# Patient Record
Sex: Female | Born: 1945
Health system: Southern US, Community
[De-identification: ages and names within clinical notes are randomized; demographics above are authoritative.]

## PROBLEM LIST (undated history)

## (undated) DIAGNOSIS — N182 Chronic kidney disease, stage 2 (mild): Secondary | ICD-10-CM

## (undated) DIAGNOSIS — Z171 Estrogen receptor negative status [ER-]: Secondary | ICD-10-CM

## (undated) DIAGNOSIS — F419 Anxiety disorder, unspecified: Secondary | ICD-10-CM

## (undated) DIAGNOSIS — Z860101 Personal history of adenomatous and serrated colon polyps: Secondary | ICD-10-CM

## (undated) DIAGNOSIS — R0600 Dyspnea, unspecified: Secondary | ICD-10-CM

## (undated) DIAGNOSIS — Z86010 Personal history of colonic polyps: Secondary | ICD-10-CM

## (undated) DIAGNOSIS — R0609 Other forms of dyspnea: Secondary | ICD-10-CM

## (undated) DIAGNOSIS — C50212 Malignant neoplasm of upper-inner quadrant of left female breast: Secondary | ICD-10-CM

## (undated) DIAGNOSIS — Z9221 Personal history of antineoplastic chemotherapy: Secondary | ICD-10-CM

## (undated) DIAGNOSIS — T4145XA Adverse effect of unspecified anesthetic, initial encounter: Secondary | ICD-10-CM

## (undated) DIAGNOSIS — Z8542 Personal history of malignant neoplasm of other parts of uterus: Secondary | ICD-10-CM

## (undated) DIAGNOSIS — Z8719 Personal history of other diseases of the digestive system: Secondary | ICD-10-CM

## (undated) DIAGNOSIS — E785 Hyperlipidemia, unspecified: Secondary | ICD-10-CM

## (undated) DIAGNOSIS — K219 Gastro-esophageal reflux disease without esophagitis: Secondary | ICD-10-CM

## (undated) DIAGNOSIS — I10 Essential (primary) hypertension: Secondary | ICD-10-CM

## (undated) DIAGNOSIS — S32000A Wedge compression fracture of unspecified lumbar vertebra, initial encounter for closed fracture: Secondary | ICD-10-CM

## (undated) DIAGNOSIS — N950 Postmenopausal bleeding: Secondary | ICD-10-CM

## (undated) DIAGNOSIS — D649 Anemia, unspecified: Secondary | ICD-10-CM

## (undated) DIAGNOSIS — Z85820 Personal history of malignant melanoma of skin: Secondary | ICD-10-CM

## (undated) DIAGNOSIS — B001 Herpesviral vesicular dermatitis: Secondary | ICD-10-CM

## (undated) DIAGNOSIS — T8859XA Other complications of anesthesia, initial encounter: Secondary | ICD-10-CM

## (undated) DIAGNOSIS — K449 Diaphragmatic hernia without obstruction or gangrene: Secondary | ICD-10-CM

## (undated) DIAGNOSIS — M1990 Unspecified osteoarthritis, unspecified site: Secondary | ICD-10-CM

## (undated) DIAGNOSIS — Z9109 Other allergy status, other than to drugs and biological substances: Secondary | ICD-10-CM

## (undated) HISTORY — DX: Personal history of malignant neoplasm of other parts of uterus: Z85.42

## (undated) HISTORY — PX: TOTAL KNEE ARTHROPLASTY: SHX125

## (undated) HISTORY — PX: BREAST RECONSTRUCTION: SHX9

## (undated) HISTORY — DX: Wedge compression fracture of unspecified lumbar vertebra, initial encounter for closed fracture: S32.000A

## (undated) HISTORY — DX: Anxiety disorder, unspecified: F41.9

## (undated) HISTORY — PX: RHINOPLASTY: SUR1284

## (undated) HISTORY — PX: AUGMENTATION MAMMAPLASTY: SUR837

## (undated) HISTORY — DX: Personal history of malignant melanoma of skin: Z85.820

## (undated) HISTORY — PX: LUMBAR DISC SURGERY: SHX700

## (undated) HISTORY — DX: Hyperlipidemia, unspecified: E78.5

## (undated) HISTORY — PX: KNEE ARTHROSCOPY: SUR90

## (undated) HISTORY — PX: PORTACATH PLACEMENT: SHX2246

## (undated) HISTORY — PX: PORT-A-CATH REMOVAL: SHX5289

## (undated) HISTORY — DX: Essential (primary) hypertension: I10

## (undated) HISTORY — PX: MASTECTOMY: SHX3

## (undated) HISTORY — DX: Gastro-esophageal reflux disease without esophagitis: K21.9

## (undated) HISTORY — PX: MODIFIED RADICAL MASTECTOMY W/ AXILLARY LYMPH NODE DISSECTION: SHX2042

---

## 1978-05-20 HISTORY — PX: HEMORRHOID SURGERY: SHX153

## 1999-04-06 ENCOUNTER — Encounter: Payer: Self-pay | Admitting: Family Medicine

## 1999-04-06 ENCOUNTER — Encounter: Admission: RE | Admit: 1999-04-06 | Discharge: 1999-04-06 | Payer: Self-pay | Admitting: Family Medicine

## 1999-05-18 ENCOUNTER — Encounter: Payer: Self-pay | Admitting: Neurosurgery

## 1999-05-22 ENCOUNTER — Observation Stay (HOSPITAL_COMMUNITY): Admission: RE | Admit: 1999-05-22 | Discharge: 1999-05-23 | Payer: Self-pay | Admitting: Neurosurgery

## 1999-05-22 ENCOUNTER — Encounter: Payer: Self-pay | Admitting: Neurosurgery

## 2000-06-04 ENCOUNTER — Ambulatory Visit (HOSPITAL_COMMUNITY): Admission: RE | Admit: 2000-06-04 | Discharge: 2000-06-04 | Payer: Self-pay | Admitting: Family Medicine

## 2000-06-04 ENCOUNTER — Encounter: Payer: Self-pay | Admitting: Family Medicine

## 2000-07-21 ENCOUNTER — Encounter (INDEPENDENT_AMBULATORY_CARE_PROVIDER_SITE_OTHER): Payer: Self-pay

## 2000-07-21 ENCOUNTER — Other Ambulatory Visit: Admission: RE | Admit: 2000-07-21 | Discharge: 2000-07-21 | Payer: Self-pay | Admitting: Gastroenterology

## 2000-07-24 ENCOUNTER — Ambulatory Visit (HOSPITAL_COMMUNITY): Admission: RE | Admit: 2000-07-24 | Discharge: 2000-07-24 | Payer: Self-pay | Admitting: General Surgery

## 2000-08-13 ENCOUNTER — Encounter: Payer: Self-pay | Admitting: Family Medicine

## 2000-08-13 ENCOUNTER — Ambulatory Visit (HOSPITAL_COMMUNITY): Admission: RE | Admit: 2000-08-13 | Discharge: 2000-08-13 | Payer: Self-pay | Admitting: Family Medicine

## 2000-09-16 ENCOUNTER — Encounter (INDEPENDENT_AMBULATORY_CARE_PROVIDER_SITE_OTHER): Payer: Self-pay

## 2000-09-16 ENCOUNTER — Other Ambulatory Visit: Admission: RE | Admit: 2000-09-16 | Discharge: 2000-09-16 | Payer: Self-pay | Admitting: Gastroenterology

## 2000-11-27 ENCOUNTER — Ambulatory Visit (HOSPITAL_COMMUNITY): Admission: AD | Admit: 2000-11-27 | Discharge: 2000-11-28 | Payer: Self-pay | Admitting: Neurosurgery

## 2000-11-27 ENCOUNTER — Encounter: Payer: Self-pay | Admitting: Neurosurgery

## 2001-02-17 HISTORY — PX: KNEE ARTHROSCOPY: SUR90

## 2001-07-07 ENCOUNTER — Other Ambulatory Visit: Admission: RE | Admit: 2001-07-07 | Discharge: 2001-07-07 | Payer: Self-pay | Admitting: Obstetrics and Gynecology

## 2002-04-23 ENCOUNTER — Encounter: Payer: Self-pay | Admitting: Specialist

## 2002-04-26 ENCOUNTER — Inpatient Hospital Stay (HOSPITAL_COMMUNITY): Admission: RE | Admit: 2002-04-26 | Discharge: 2002-05-01 | Payer: Self-pay | Admitting: Specialist

## 2002-04-26 ENCOUNTER — Encounter: Payer: Self-pay | Admitting: Specialist

## 2002-04-28 ENCOUNTER — Encounter: Payer: Self-pay | Admitting: Specialist

## 2002-07-20 ENCOUNTER — Other Ambulatory Visit: Admission: RE | Admit: 2002-07-20 | Discharge: 2002-07-20 | Payer: Self-pay | Admitting: *Deleted

## 2002-12-01 ENCOUNTER — Encounter: Payer: Self-pay | Admitting: Family Medicine

## 2002-12-01 ENCOUNTER — Encounter: Admission: RE | Admit: 2002-12-01 | Discharge: 2002-12-01 | Payer: Self-pay | Admitting: Family Medicine

## 2002-12-23 ENCOUNTER — Encounter: Admission: RE | Admit: 2002-12-23 | Discharge: 2002-12-23 | Payer: Self-pay | Admitting: Family Medicine

## 2002-12-23 ENCOUNTER — Encounter: Payer: Self-pay | Admitting: Family Medicine

## 2003-05-21 DIAGNOSIS — Z8719 Personal history of other diseases of the digestive system: Secondary | ICD-10-CM

## 2003-05-21 HISTORY — DX: Personal history of other diseases of the digestive system: Z87.19

## 2003-07-14 ENCOUNTER — Encounter: Admission: RE | Admit: 2003-07-14 | Discharge: 2003-07-14 | Payer: Self-pay | Admitting: Family Medicine

## 2003-08-03 ENCOUNTER — Other Ambulatory Visit: Admission: RE | Admit: 2003-08-03 | Discharge: 2003-08-03 | Payer: Self-pay | Admitting: *Deleted

## 2003-08-03 ENCOUNTER — Encounter: Payer: Self-pay | Admitting: Gastroenterology

## 2003-08-03 DIAGNOSIS — K649 Unspecified hemorrhoids: Secondary | ICD-10-CM | POA: Insufficient documentation

## 2003-08-03 DIAGNOSIS — D126 Benign neoplasm of colon, unspecified: Secondary | ICD-10-CM | POA: Insufficient documentation

## 2004-03-18 ENCOUNTER — Emergency Department (HOSPITAL_COMMUNITY): Admission: EM | Admit: 2004-03-18 | Discharge: 2004-03-18 | Payer: Self-pay | Admitting: Emergency Medicine

## 2004-03-27 ENCOUNTER — Ambulatory Visit: Payer: Self-pay | Admitting: Family Medicine

## 2004-04-13 ENCOUNTER — Ambulatory Visit: Payer: Self-pay | Admitting: Family Medicine

## 2004-08-27 ENCOUNTER — Ambulatory Visit: Payer: Self-pay | Admitting: Family Medicine

## 2004-09-05 ENCOUNTER — Other Ambulatory Visit: Admission: RE | Admit: 2004-09-05 | Discharge: 2004-09-05 | Payer: Self-pay | Admitting: Obstetrics and Gynecology

## 2004-09-13 ENCOUNTER — Ambulatory Visit (HOSPITAL_COMMUNITY): Admission: RE | Admit: 2004-09-13 | Discharge: 2004-09-13 | Payer: Self-pay | Admitting: Obstetrics and Gynecology

## 2004-09-26 ENCOUNTER — Ambulatory Visit: Payer: Self-pay | Admitting: Family Medicine

## 2004-12-20 ENCOUNTER — Ambulatory Visit: Payer: Self-pay | Admitting: Family Medicine

## 2004-12-27 ENCOUNTER — Ambulatory Visit: Payer: Self-pay | Admitting: Family Medicine

## 2005-02-13 ENCOUNTER — Ambulatory Visit: Payer: Self-pay | Admitting: Family Medicine

## 2005-06-28 ENCOUNTER — Ambulatory Visit: Payer: Self-pay | Admitting: Family Medicine

## 2005-09-25 ENCOUNTER — Ambulatory Visit: Payer: Self-pay | Admitting: Family Medicine

## 2006-01-03 ENCOUNTER — Ambulatory Visit: Payer: Self-pay | Admitting: Family Medicine

## 2006-08-06 ENCOUNTER — Ambulatory Visit: Payer: Self-pay | Admitting: Family Medicine

## 2006-10-24 ENCOUNTER — Ambulatory Visit: Payer: Self-pay | Admitting: Family Medicine

## 2006-10-24 LAB — CONVERTED CEMR LAB
ALT: 27 units/L (ref 0–40)
AST: 29 units/L (ref 0–37)
Albumin: 3.8 g/dL (ref 3.5–5.2)
Alkaline Phosphatase: 103 units/L (ref 39–117)
BUN: 20 mg/dL (ref 6–23)
Basophils Absolute: 0 10*3/uL (ref 0.0–0.1)
Basophils Relative: 0.8 % (ref 0.0–1.0)
CO2: 31 meq/L (ref 19–32)
Chloride: 105 meq/L (ref 96–112)
Cholesterol: 185 mg/dL (ref 0–200)
Eosinophils Absolute: 0.2 10*3/uL (ref 0.0–0.6)
Eosinophils Relative: 4.9 % (ref 0.0–5.0)
GFR calc Af Amer: 109 mL/min
GFR calc non Af Amer: 90 mL/min
Glucose, Bld: 110 mg/dL — ABNORMAL HIGH (ref 70–99)
HCT: 33.8 % — ABNORMAL LOW (ref 36.0–46.0)
HDL: 53.4 mg/dL (ref >39.0)
Hemoglobin: 11.5 g/dL — ABNORMAL LOW (ref 12.0–15.0)
Hgb A1c MFr Bld: 6.1 % — ABNORMAL HIGH (ref 4.6–6.0)
LDL Cholesterol: 104 mg/dL — ABNORMAL HIGH (ref 0–99)
Lymphocytes Relative: 25.9 % (ref 12.0–46.0)
MCHC: 34 g/dL (ref 30.0–36.0)
MCV: 84 fL (ref 78.0–100.0)
Monocytes Absolute: 0.4 10*3/uL (ref 0.2–0.7)
Monocytes Relative: 7.9 % (ref 3.0–11.0)
Neutro Abs: 3.2 10*3/uL (ref 1.4–7.7)
Neutrophils Relative %: 60.5 % (ref 43.0–77.0)
Platelets: 241 10*3/uL (ref 150–400)
Potassium: 3.6 meq/L (ref 3.5–5.1)
TSH: 3.12 microintl units/mL (ref 0.35–5.50)
Total Bilirubin: 0.6 mg/dL (ref 0.3–1.2)
Total CHOL/HDL Ratio: 3.5
Total Protein: 6.7 g/dL (ref 6.0–8.3)
Triglycerides: 137 mg/dL (ref 0–149)

## 2006-11-24 ENCOUNTER — Ambulatory Visit: Payer: Self-pay | Admitting: Gastroenterology

## 2006-11-25 ENCOUNTER — Encounter: Payer: Self-pay | Admitting: Gastroenterology

## 2006-11-25 ENCOUNTER — Ambulatory Visit: Payer: Self-pay | Admitting: Gastroenterology

## 2006-11-25 DIAGNOSIS — K449 Diaphragmatic hernia without obstruction or gangrene: Secondary | ICD-10-CM | POA: Insufficient documentation

## 2006-12-08 DIAGNOSIS — K219 Gastro-esophageal reflux disease without esophagitis: Secondary | ICD-10-CM | POA: Insufficient documentation

## 2006-12-08 DIAGNOSIS — I10 Essential (primary) hypertension: Secondary | ICD-10-CM | POA: Insufficient documentation

## 2006-12-08 DIAGNOSIS — F320 Major depressive disorder, single episode, mild: Secondary | ICD-10-CM | POA: Insufficient documentation

## 2006-12-08 DIAGNOSIS — J45909 Unspecified asthma, uncomplicated: Secondary | ICD-10-CM | POA: Insufficient documentation

## 2007-01-08 ENCOUNTER — Ambulatory Visit: Payer: Self-pay | Admitting: Family Medicine

## 2007-01-08 DIAGNOSIS — L723 Sebaceous cyst: Secondary | ICD-10-CM | POA: Insufficient documentation

## 2007-01-20 ENCOUNTER — Telehealth: Payer: Self-pay | Admitting: Family Medicine

## 2007-03-26 ENCOUNTER — Encounter (INDEPENDENT_AMBULATORY_CARE_PROVIDER_SITE_OTHER): Payer: Self-pay | Admitting: Obstetrics and Gynecology

## 2007-03-26 ENCOUNTER — Ambulatory Visit (HOSPITAL_COMMUNITY): Admission: RE | Admit: 2007-03-26 | Discharge: 2007-03-26 | Payer: Self-pay | Admitting: Obstetrics and Gynecology

## 2007-05-25 DIAGNOSIS — K5289 Other specified noninfective gastroenteritis and colitis: Secondary | ICD-10-CM | POA: Insufficient documentation

## 2007-05-28 ENCOUNTER — Ambulatory Visit: Payer: Self-pay | Admitting: Family Medicine

## 2007-07-15 ENCOUNTER — Telehealth: Payer: Self-pay | Admitting: Family Medicine

## 2007-09-09 DIAGNOSIS — K319 Disease of stomach and duodenum, unspecified: Secondary | ICD-10-CM | POA: Insufficient documentation

## 2007-09-09 DIAGNOSIS — E785 Hyperlipidemia, unspecified: Secondary | ICD-10-CM | POA: Insufficient documentation

## 2007-10-14 ENCOUNTER — Ambulatory Visit: Payer: Self-pay | Admitting: Family Medicine

## 2007-10-16 LAB — CONVERTED CEMR LAB
ALT: 21 units/L (ref 0–35)
Alkaline Phosphatase: 93 units/L (ref 39–117)
BUN: 20 mg/dL (ref 6–23)
Basophils Absolute: 0.1 10*3/uL (ref 0.0–0.1)
Basophils Relative: 1.3 % — ABNORMAL HIGH (ref 0.0–1.0)
Bilirubin, Direct: 0.1 mg/dL (ref 0.0–0.3)
CO2: 30 meq/L (ref 19–32)
Calcium: 9.1 mg/dL (ref 8.4–10.5)
Chloride: 107 meq/L (ref 96–112)
Cholesterol: 176 mg/dL (ref 0–200)
Creatinine, Ser: 0.9 mg/dL (ref 0.4–1.2)
Eosinophils Absolute: 0.2 10*3/uL (ref 0.0–0.7)
Eosinophils Relative: 4.5 % (ref 0.0–5.0)
GFR calc Af Amer: 82 mL/min
GFR calc non Af Amer: 67 mL/min
HCT: 31.5 % — ABNORMAL LOW (ref 36.0–46.0)
HDL: 48.8 mg/dL (ref >39.0)
Hemoglobin: 10.3 g/dL — ABNORMAL LOW (ref 12.0–15.0)
LDL Cholesterol: 107 mg/dL — ABNORMAL HIGH (ref 0–99)
Lymphocytes Relative: 28.6 % (ref 12.0–46.0)
MCHC: 32.7 g/dL (ref 30.0–36.0)
MCV: 80.5 fL (ref 78.0–100.0)
Monocytes Absolute: 0.3 10*3/uL (ref 0.1–1.0)
Neutro Abs: 2.5 10*3/uL (ref 1.4–7.7)
Neutrophils Relative %: 58.6 % (ref 43.0–77.0)
Potassium: 4.2 meq/L (ref 3.5–5.1)
RBC: 3.92 M/uL (ref 3.87–5.11)
TSH: 3.07 microintl units/mL (ref 0.35–5.50)
Total Bilirubin: 0.6 mg/dL (ref 0.3–1.2)
Total CHOL/HDL Ratio: 3.6
Total Protein: 6.2 g/dL (ref 6.0–8.3)
Triglycerides: 101 mg/dL (ref 0–149)
VLDL: 20 mg/dL (ref 0–40)
WBC: 4.3 10*3/uL — ABNORMAL LOW (ref 4.5–10.5)

## 2007-10-21 ENCOUNTER — Ambulatory Visit: Payer: Self-pay | Admitting: Internal Medicine

## 2007-10-21 ENCOUNTER — Ambulatory Visit: Payer: Self-pay | Admitting: Family Medicine

## 2007-10-21 DIAGNOSIS — M810 Age-related osteoporosis without current pathological fracture: Secondary | ICD-10-CM | POA: Insufficient documentation

## 2007-10-21 DIAGNOSIS — M1990 Unspecified osteoarthritis, unspecified site: Secondary | ICD-10-CM | POA: Insufficient documentation

## 2007-10-22 ENCOUNTER — Telehealth (INDEPENDENT_AMBULATORY_CARE_PROVIDER_SITE_OTHER): Payer: Self-pay | Admitting: *Deleted

## 2007-11-09 ENCOUNTER — Ambulatory Visit: Payer: Self-pay | Admitting: Gastroenterology

## 2007-11-09 ENCOUNTER — Telehealth: Payer: Self-pay | Admitting: Family Medicine

## 2007-11-09 DIAGNOSIS — Z860100 Personal history of colon polyps, unspecified: Secondary | ICD-10-CM | POA: Insufficient documentation

## 2007-11-09 DIAGNOSIS — K625 Hemorrhage of anus and rectum: Secondary | ICD-10-CM | POA: Insufficient documentation

## 2007-11-10 ENCOUNTER — Ambulatory Visit: Payer: Self-pay | Admitting: Gastroenterology

## 2007-11-10 LAB — HM COLONOSCOPY

## 2007-12-04 ENCOUNTER — Telehealth: Payer: Self-pay | Admitting: Family Medicine

## 2007-12-07 ENCOUNTER — Telehealth (INDEPENDENT_AMBULATORY_CARE_PROVIDER_SITE_OTHER): Payer: Self-pay | Admitting: *Deleted

## 2007-12-28 ENCOUNTER — Emergency Department (HOSPITAL_BASED_OUTPATIENT_CLINIC_OR_DEPARTMENT_OTHER): Admission: EM | Admit: 2007-12-28 | Discharge: 2007-12-28 | Payer: Self-pay | Admitting: Emergency Medicine

## 2008-03-28 ENCOUNTER — Ambulatory Visit: Payer: Self-pay | Admitting: Family Medicine

## 2008-03-28 LAB — CONVERTED CEMR LAB
Bilirubin Urine: NEGATIVE
Blood in Urine, dipstick: NEGATIVE
Glucose, Urine, Semiquant: NEGATIVE
Ketones, urine, test strip: NEGATIVE
Nitrite: NEGATIVE
Protein, U semiquant: NEGATIVE
Specific Gravity, Urine: <1.005
pH: 7

## 2008-04-25 ENCOUNTER — Telehealth: Payer: Self-pay | Admitting: Family Medicine

## 2008-05-27 ENCOUNTER — Telehealth: Payer: Self-pay | Admitting: Family Medicine

## 2008-07-13 ENCOUNTER — Ambulatory Visit: Payer: Self-pay | Admitting: Family Medicine

## 2008-07-13 LAB — CONVERTED CEMR LAB
Bilirubin Urine: NEGATIVE
Blood in Urine, dipstick: NEGATIVE
Nitrite: NEGATIVE
Protein, U semiquant: NEGATIVE
Specific Gravity, Urine: 1.025
Urobilinogen, UA: 0.2
pH: 5.5

## 2008-07-14 ENCOUNTER — Encounter: Payer: Self-pay | Admitting: Family Medicine

## 2008-10-28 ENCOUNTER — Ambulatory Visit: Payer: Self-pay | Admitting: Family Medicine

## 2008-11-01 ENCOUNTER — Telehealth: Payer: Self-pay | Admitting: Family Medicine

## 2008-11-07 ENCOUNTER — Telehealth: Payer: Self-pay | Admitting: Family Medicine

## 2008-12-13 ENCOUNTER — Emergency Department (HOSPITAL_BASED_OUTPATIENT_CLINIC_OR_DEPARTMENT_OTHER): Admission: EM | Admit: 2008-12-13 | Discharge: 2008-12-13 | Payer: Self-pay | Admitting: Emergency Medicine

## 2008-12-13 ENCOUNTER — Ambulatory Visit: Payer: Self-pay | Admitting: Radiology

## 2009-03-21 ENCOUNTER — Telehealth: Payer: Self-pay | Admitting: Family Medicine

## 2009-05-01 ENCOUNTER — Ambulatory Visit: Payer: Self-pay | Admitting: Family Medicine

## 2009-05-01 DIAGNOSIS — M545 Low back pain, unspecified: Secondary | ICD-10-CM | POA: Insufficient documentation

## 2009-05-01 LAB — CONVERTED CEMR LAB
Bilirubin Urine: NEGATIVE
Ketones, urine, test strip: NEGATIVE
Specific Gravity, Urine: 1.015
Urobilinogen, UA: 0.2

## 2009-05-03 ENCOUNTER — Ambulatory Visit: Payer: Self-pay | Admitting: Family Medicine

## 2009-05-03 DIAGNOSIS — S2249XA Multiple fractures of ribs, unspecified side, initial encounter for closed fracture: Secondary | ICD-10-CM | POA: Insufficient documentation

## 2009-05-03 DIAGNOSIS — IMO0002 Reserved for concepts with insufficient information to code with codable children: Secondary | ICD-10-CM | POA: Insufficient documentation

## 2009-05-05 LAB — CONVERTED CEMR LAB
ALT: 20 units/L (ref 0–35)
AST: 24 units/L (ref 0–37)
Albumin: 4.1 g/dL (ref 3.5–5.2)
Eosinophils Relative: 4.1 % (ref 0.0–5.0)
GFR calc non Af Amer: 76.84 mL/min (ref >60)
HCT: 37.3 % (ref 36.0–46.0)
Hemoglobin: 12.6 g/dL (ref 12.0–15.0)
Lymphs Abs: 1.1 10*3/uL (ref 0.7–4.0)
Monocytes Relative: 5.5 % (ref 3.0–12.0)
Neutro Abs: 4 10*3/uL (ref 1.4–7.7)
Potassium: 3.6 meq/L (ref 3.5–5.1)
Sodium: 140 meq/L (ref 135–145)
TSH: 2.34 microintl units/mL (ref 0.35–5.50)
WBC: 5.7 10*3/uL (ref 4.5–10.5)

## 2009-05-08 ENCOUNTER — Encounter: Payer: Self-pay | Admitting: Family Medicine

## 2009-05-09 ENCOUNTER — Ambulatory Visit: Payer: Self-pay | Admitting: Internal Medicine

## 2009-05-15 ENCOUNTER — Encounter (HOSPITAL_COMMUNITY): Admission: RE | Admit: 2009-05-15 | Discharge: 2009-07-24 | Payer: Self-pay | Admitting: Family Medicine

## 2009-05-15 ENCOUNTER — Encounter: Admission: RE | Admit: 2009-05-15 | Discharge: 2009-05-15 | Payer: Self-pay | Admitting: Family Medicine

## 2009-05-16 ENCOUNTER — Encounter: Payer: Self-pay | Admitting: Family Medicine

## 2009-05-20 DIAGNOSIS — Z9221 Personal history of antineoplastic chemotherapy: Secondary | ICD-10-CM

## 2009-05-20 HISTORY — DX: Personal history of antineoplastic chemotherapy: Z92.21

## 2009-06-23 ENCOUNTER — Ambulatory Visit: Payer: Self-pay | Admitting: Family Medicine

## 2009-06-23 DIAGNOSIS — J019 Acute sinusitis, unspecified: Secondary | ICD-10-CM | POA: Insufficient documentation

## 2009-09-11 ENCOUNTER — Telehealth: Payer: Self-pay | Admitting: Family Medicine

## 2010-01-30 ENCOUNTER — Encounter: Admission: RE | Admit: 2010-01-30 | Discharge: 2010-01-30 | Payer: Self-pay | Admitting: Obstetrics and Gynecology

## 2010-02-05 ENCOUNTER — Encounter: Admission: RE | Admit: 2010-02-05 | Discharge: 2010-02-05 | Payer: Self-pay | Admitting: Obstetrics and Gynecology

## 2010-02-05 ENCOUNTER — Encounter: Payer: Self-pay | Admitting: Family Medicine

## 2010-02-08 ENCOUNTER — Encounter: Admission: RE | Admit: 2010-02-08 | Discharge: 2010-02-08 | Payer: Self-pay | Admitting: Obstetrics and Gynecology

## 2010-02-08 LAB — HM MAMMOGRAPHY

## 2010-02-14 ENCOUNTER — Encounter: Payer: Self-pay | Admitting: Family Medicine

## 2010-02-21 ENCOUNTER — Telehealth: Payer: Self-pay | Admitting: Family Medicine

## 2010-02-26 ENCOUNTER — Ambulatory Visit: Payer: Self-pay | Admitting: Family Medicine

## 2010-03-21 ENCOUNTER — Encounter (INDEPENDENT_AMBULATORY_CARE_PROVIDER_SITE_OTHER): Payer: Self-pay | Admitting: General Surgery

## 2010-03-21 ENCOUNTER — Inpatient Hospital Stay (HOSPITAL_COMMUNITY)
Admission: RE | Admit: 2010-03-21 | Discharge: 2010-03-23 | Payer: Self-pay | Source: Home / Self Care | Admitting: General Surgery

## 2010-03-27 ENCOUNTER — Encounter: Payer: Self-pay | Admitting: Family Medicine

## 2010-04-04 ENCOUNTER — Encounter: Payer: Self-pay | Admitting: Family Medicine

## 2010-04-05 ENCOUNTER — Ambulatory Visit: Payer: Self-pay | Admitting: Oncology

## 2010-04-11 ENCOUNTER — Encounter: Payer: Self-pay | Admitting: Family Medicine

## 2010-04-19 ENCOUNTER — Ambulatory Visit (HOSPITAL_BASED_OUTPATIENT_CLINIC_OR_DEPARTMENT_OTHER)
Admission: RE | Admit: 2010-04-19 | Discharge: 2010-04-19 | Payer: Self-pay | Source: Home / Self Care | Admitting: General Surgery

## 2010-04-24 ENCOUNTER — Ambulatory Visit (HOSPITAL_COMMUNITY)
Admission: RE | Admit: 2010-04-24 | Discharge: 2010-04-24 | Payer: Self-pay | Source: Home / Self Care | Admitting: Oncology

## 2010-04-24 ENCOUNTER — Ambulatory Visit: Payer: Self-pay

## 2010-04-24 ENCOUNTER — Encounter: Payer: Self-pay | Admitting: Cardiology

## 2010-04-24 ENCOUNTER — Encounter: Payer: Self-pay | Admitting: Oncology

## 2010-04-25 ENCOUNTER — Encounter: Payer: Self-pay | Admitting: Family Medicine

## 2010-04-25 LAB — CBC WITH DIFFERENTIAL/PLATELET
BASO%: 0.5 % (ref 0.0–2.0)
EOS%: 11.2 % — ABNORMAL HIGH (ref 0.0–7.0)
LYMPH%: 21.9 % (ref 14.0–49.7)
MCH: 27.8 pg (ref 25.1–34.0)
MCHC: 32.7 g/dL (ref 31.5–36.0)
MCV: 85 fL (ref 79.5–101.0)
MONO#: 0.5 10*3/uL (ref 0.1–0.9)
MONO%: 9.5 % (ref 0.0–14.0)
Platelets: 162 10*3/uL (ref 145–400)
RBC: 4.21 10*6/uL (ref 3.70–5.45)
WBC: 5.5 10*3/uL (ref 3.9–10.3)

## 2010-04-25 LAB — COMPREHENSIVE METABOLIC PANEL
ALT: 12 U/L (ref 0–35)
CO2: 29 mEq/L (ref 19–32)
Sodium: 139 mEq/L (ref 135–145)
Total Bilirubin: 0.5 mg/dL (ref 0.3–1.2)
Total Protein: 6.6 g/dL (ref 6.0–8.3)

## 2010-05-02 ENCOUNTER — Encounter: Payer: Self-pay | Admitting: Family Medicine

## 2010-05-02 LAB — CBC WITH DIFFERENTIAL/PLATELET
BASO%: 2.2 % — ABNORMAL HIGH (ref 0.0–2.0)
EOS%: 24.7 % — ABNORMAL HIGH (ref 0.0–7.0)
MCHC: 32.3 g/dL (ref 31.5–36.0)
MONO#: 0 10*3/uL — ABNORMAL LOW (ref 0.1–0.9)
RBC: 3.76 10*6/uL (ref 3.70–5.45)
RDW: 13.4 % (ref 11.2–14.5)
WBC: 0.9 10*3/uL — CL (ref 3.9–10.3)
lymph#: 0.5 10*3/uL — ABNORMAL LOW (ref 0.9–3.3)
nRBC: 0 % (ref 0–0)

## 2010-05-02 LAB — URINALYSIS, MICROSCOPIC - CHCC
Blood: NEGATIVE
Glucose: NEGATIVE g/dL
Leukocyte Esterase: NEGATIVE
Nitrite: NEGATIVE

## 2010-05-02 LAB — BASIC METABOLIC PANEL
Chloride: 103 mEq/L (ref 96–112)
Potassium: 4.3 mEq/L (ref 3.5–5.3)

## 2010-05-03 LAB — URINE CULTURE

## 2010-05-07 ENCOUNTER — Telehealth: Payer: Self-pay | Admitting: Family Medicine

## 2010-05-08 ENCOUNTER — Ambulatory Visit: Payer: Self-pay | Admitting: Oncology

## 2010-05-09 ENCOUNTER — Encounter: Payer: Self-pay | Admitting: Family Medicine

## 2010-05-09 LAB — COMPREHENSIVE METABOLIC PANEL
AST: 17 U/L (ref 0–37)
Albumin: 4.4 g/dL (ref 3.5–5.2)
BUN: 21 mg/dL (ref 6–23)
Calcium: 8.7 mg/dL (ref 8.4–10.5)
Chloride: 103 mEq/L (ref 96–112)
Glucose, Bld: 98 mg/dL (ref 70–99)
Potassium: 4 mEq/L (ref 3.5–5.3)
Sodium: 140 mEq/L (ref 135–145)
Total Protein: 6.3 g/dL (ref 6.0–8.3)

## 2010-05-09 LAB — CBC WITH DIFFERENTIAL/PLATELET
Eosinophils Absolute: 0.1 10*3/uL (ref 0.0–0.5)
HCT: 35.4 % (ref 34.8–46.6)
LYMPH%: 15.9 % (ref 14.0–49.7)
MCHC: 32.5 g/dL (ref 31.5–36.0)
MONO#: 0.8 10*3/uL (ref 0.1–0.9)
NEUT%: 72.9 % (ref 38.4–76.8)
Platelets: 138 10*3/uL — ABNORMAL LOW (ref 145–400)

## 2010-05-16 ENCOUNTER — Encounter: Payer: Self-pay | Admitting: Family Medicine

## 2010-05-16 LAB — CBC WITH DIFFERENTIAL/PLATELET
Basophils Absolute: 0 10*3/uL (ref 0.0–0.1)
EOS%: 1 % (ref 0.0–7.0)
HCT: 29.5 % — ABNORMAL LOW (ref 34.8–46.6)
HGB: 10.1 g/dL — ABNORMAL LOW (ref 11.6–15.9)
MCH: 28.8 pg (ref 25.1–34.0)
MCV: 84.3 fL (ref 79.5–101.0)
MONO%: 0.6 % (ref 0.0–14.0)
NEUT%: 85.1 % — ABNORMAL HIGH (ref 38.4–76.8)

## 2010-05-16 LAB — BASIC METABOLIC PANEL
BUN: 23 mg/dL (ref 6–23)
Creatinine, Ser: 0.8 mg/dL (ref 0.40–1.20)

## 2010-05-23 ENCOUNTER — Encounter: Payer: Self-pay | Admitting: Family Medicine

## 2010-05-23 LAB — COMPREHENSIVE METABOLIC PANEL
ALT: 19 U/L (ref 0–35)
AST: 22 U/L (ref 0–37)
Albumin: 3.8 g/dL (ref 3.5–5.2)
Alkaline Phosphatase: 95 U/L (ref 39–117)
BUN: 15 mg/dL (ref 6–23)
CO2: 31 mEq/L (ref 19–32)
Calcium: 9.1 mg/dL (ref 8.4–10.5)
Chloride: 100 mEq/L (ref 96–112)
Creatinine, Ser: 0.92 mg/dL (ref 0.40–1.20)
Glucose, Bld: 85 mg/dL (ref 70–99)
Potassium: 3.9 mEq/L (ref 3.5–5.3)
Sodium: 140 mEq/L (ref 135–145)
Total Bilirubin: 0.5 mg/dL (ref 0.3–1.2)
Total Protein: 6 g/dL (ref 6.0–8.3)

## 2010-05-23 LAB — CBC WITH DIFFERENTIAL/PLATELET
BASO%: 1.5 % (ref 0.0–2.0)
Basophils Absolute: 0.2 10*3/uL — ABNORMAL HIGH (ref 0.0–0.1)
EOS%: 1.3 % (ref 0.0–7.0)
Eosinophils Absolute: 0.1 10*3/uL (ref 0.0–0.5)
HCT: 31.6 % — ABNORMAL LOW (ref 34.8–46.6)
HGB: 10.3 g/dL — ABNORMAL LOW (ref 11.6–15.9)
LYMPH%: 9.3 % — ABNORMAL LOW (ref 14.0–49.7)
MCH: 27.8 pg (ref 25.1–34.0)
MCHC: 32.6 g/dL (ref 31.5–36.0)
MCV: 85.2 fL (ref 79.5–101.0)
MONO#: 1.1 10*3/uL — ABNORMAL HIGH (ref 0.1–0.9)
MONO%: 9.9 % (ref 0.0–14.0)
NEUT#: 8.5 10*3/uL — ABNORMAL HIGH (ref 1.5–6.5)
NEUT%: 78 % — ABNORMAL HIGH (ref 38.4–76.8)
Platelets: 151 10*3/uL (ref 145–400)
RBC: 3.71 10*6/uL (ref 3.70–5.45)
RDW: 15.2 % — ABNORMAL HIGH (ref 11.2–14.5)
WBC: 10.8 10*3/uL — ABNORMAL HIGH (ref 3.9–10.3)
lymph#: 1 10*3/uL (ref 0.9–3.3)
nRBC: 0 % (ref 0–0)

## 2010-05-31 LAB — BASIC METABOLIC PANEL
BUN: 25 mg/dL — ABNORMAL HIGH (ref 6–23)
CO2: 27 mEq/L (ref 19–32)
Calcium: 9 mg/dL (ref 8.4–10.5)
Chloride: 103 mEq/L (ref 96–112)
Creatinine, Ser: 0.8 mg/dL (ref 0.40–1.20)
Glucose, Bld: 113 mg/dL — ABNORMAL HIGH (ref 70–99)
Potassium: 3.6 mEq/L (ref 3.5–5.3)
Sodium: 141 mEq/L (ref 135–145)

## 2010-05-31 LAB — CBC WITH DIFFERENTIAL/PLATELET
BASO%: 1.7 % (ref 0.0–2.0)
Basophils Absolute: 0 10*3/uL (ref 0.0–0.1)
EOS%: 0.9 % (ref 0.0–7.0)
Eosinophils Absolute: 0 10*3/uL (ref 0.0–0.5)
HCT: 27.7 % — ABNORMAL LOW (ref 34.8–46.6)
HGB: 9.5 g/dL — ABNORMAL LOW (ref 11.6–15.9)
LYMPH%: 9 % — ABNORMAL LOW (ref 14.0–49.7)
MCH: 28.7 pg (ref 25.1–34.0)
MCHC: 34.1 g/dL (ref 31.5–36.0)
MCV: 84.2 fL (ref 79.5–101.0)
MONO#: 0.1 10*3/uL (ref 0.1–0.9)
MONO%: 3.1 % (ref 0.0–14.0)
NEUT#: 2.4 10*3/uL (ref 1.5–6.5)
NEUT%: 85.3 % — ABNORMAL HIGH (ref 38.4–76.8)
Platelets: 118 10*3/uL — ABNORMAL LOW (ref 145–400)
RBC: 3.29 10*6/uL — ABNORMAL LOW (ref 3.70–5.45)
RDW: 14.7 % — ABNORMAL HIGH (ref 11.2–14.5)
WBC: 2.8 10*3/uL — ABNORMAL LOW (ref 3.9–10.3)
lymph#: 0.3 10*3/uL — ABNORMAL LOW (ref 0.9–3.3)

## 2010-06-06 LAB — CBC WITH DIFFERENTIAL/PLATELET
BASO%: 1.6 % (ref 0.0–2.0)
Basophils Absolute: 0.1 10*3/uL (ref 0.0–0.1)
EOS%: 1.4 % (ref 0.0–7.0)
Eosinophils Absolute: 0.1 10*3/uL (ref 0.0–0.5)
HCT: 29.2 % — ABNORMAL LOW (ref 34.8–46.6)
HGB: 9.6 g/dL — ABNORMAL LOW (ref 11.6–15.9)
LYMPH%: 12 % — ABNORMAL LOW (ref 14.0–49.7)
MCH: 28.2 pg (ref 25.1–34.0)
MCHC: 32.9 g/dL (ref 31.5–36.0)
MCV: 85.9 fL (ref 79.5–101.0)
MONO#: 0.9 10*3/uL (ref 0.1–0.9)
MONO%: 12.4 % (ref 0.0–14.0)
NEUT#: 5 10*3/uL (ref 1.5–6.5)
NEUT%: 72.6 % (ref 38.4–76.8)
Platelets: 197 10*3/uL (ref 145–400)
RBC: 3.4 10*6/uL — ABNORMAL LOW (ref 3.70–5.45)
RDW: 18 % — ABNORMAL HIGH (ref 11.2–14.5)
WBC: 6.9 10*3/uL (ref 3.9–10.3)
lymph#: 0.8 10*3/uL — ABNORMAL LOW (ref 0.9–3.3)

## 2010-06-06 LAB — COMPREHENSIVE METABOLIC PANEL
ALT: 13 U/L (ref 0–35)
AST: 17 U/L (ref 0–37)
Albumin: 4.3 g/dL (ref 3.5–5.2)
Alkaline Phosphatase: 95 U/L (ref 39–117)
BUN: 17 mg/dL (ref 6–23)
CO2: 27 mEq/L (ref 19–32)
Calcium: 9.3 mg/dL (ref 8.4–10.5)
Chloride: 103 mEq/L (ref 96–112)
Creatinine, Ser: 0.85 mg/dL (ref 0.40–1.20)
Glucose, Bld: 137 mg/dL — ABNORMAL HIGH (ref 70–99)
Potassium: 3.9 mEq/L (ref 3.5–5.3)
Sodium: 141 mEq/L (ref 135–145)
Total Bilirubin: 0.3 mg/dL (ref 0.3–1.2)
Total Protein: 6.1 g/dL (ref 6.0–8.3)

## 2010-06-07 ENCOUNTER — Ambulatory Visit (HOSPITAL_BASED_OUTPATIENT_CLINIC_OR_DEPARTMENT_OTHER): Payer: Managed Care, Other (non HMO) | Admitting: Oncology

## 2010-06-14 LAB — CBC WITH DIFFERENTIAL/PLATELET
BASO%: 1.2 % (ref 0.0–2.0)
Basophils Absolute: 0 10*3/uL (ref 0.0–0.1)
EOS%: 1 % (ref 0.0–7.0)
Eosinophils Absolute: 0 10*3/uL (ref 0.0–0.5)
HCT: 25.9 % — ABNORMAL LOW (ref 34.8–46.6)
HGB: 8.9 g/dL — ABNORMAL LOW (ref 11.6–15.9)
LYMPH%: 7.6 % — ABNORMAL LOW (ref 14.0–49.7)
MCH: 29.4 pg (ref 25.1–34.0)
MCHC: 34.5 g/dL (ref 31.5–36.0)
MCV: 85.2 fL (ref 79.5–101.0)
MONO#: 0.1 10*3/uL (ref 0.1–0.9)
MONO%: 3.9 % (ref 0.0–14.0)
NEUT#: 2.9 10*3/uL (ref 1.5–6.5)
NEUT%: 86.3 % — ABNORMAL HIGH (ref 38.4–76.8)
Platelets: 128 10*3/uL — ABNORMAL LOW (ref 145–400)
RBC: 3.04 10*6/uL — ABNORMAL LOW (ref 3.70–5.45)
lymph#: 0.3 10*3/uL — ABNORMAL LOW (ref 0.9–3.3)

## 2010-06-14 LAB — BASIC METABOLIC PANEL WITH GFR
BUN: 26 mg/dL — ABNORMAL HIGH (ref 6–23)
CO2: 29 meq/L (ref 19–32)
Calcium: 9.2 mg/dL (ref 8.4–10.5)
Chloride: 102 meq/L (ref 96–112)
Creatinine, Ser: 0.8 mg/dL (ref 0.40–1.20)
Glucose, Bld: 88 mg/dL (ref 70–99)
Potassium: 3.6 meq/L (ref 3.5–5.3)
Sodium: 139 meq/L (ref 135–145)

## 2010-06-19 NOTE — Letter (Signed)
Summary: [facility]   Imported By: Maryln Gottron 03/06/2010 15:52:28  _____________________________________________________________________  External Attachment:    Type:   Image     Comment:   External Document

## 2010-06-19 NOTE — Letter (Signed)
Summary: [facility]   Imported By: Maryln Gottron 02/15/2010 13:49:46  _____________________________________________________________________  External Attachment:    Type:   Image     Comment:   External Document

## 2010-06-19 NOTE — Assessment & Plan Note (Signed)
Summary: FU ON MEDS/NJR   Vital Signs:  Patient profile:   65 year old female Weight:      196 pounds O2 Sat:      97 % Temp:     98.1 degrees F Pulse rate:   76 / minute BP sitting:   140 / 80  (left arm) Cuff size:   large  Vitals Entered By: Pura Spice, RN (February 26, 2010 1:32 PM) CC: dx with left breast cancer 3 wks ago surgery date nov 2011   History of Present Illness: Here to follow up after recently being diagnosed with ductal carcinoma. She is set for a left mastectomy per Dr. Abbey Chatters on 03-21-10. Of course this is upsetting to her, but she seems to be handling it fairly well.   Allergies (verified): No Known Drug Allergies  Past History:  Past Medical History: Reviewed history from 05/03/2009 and no changes required. Asthma Depression GERD Hypertension Adenomatous Colon Polyps, hx of Hyperlipidemia Osteoarthritis compression fracture of L2  Osteoporosis per DEXA 2009 hematuria Anxiety Disorder Peptic stricture Hemorrhoids  Past Surgical History: Reviewed history from 05/01/2009 and no changes required. Left  Knee Arthroscopy-02/2001  Dr. Shelle Iron left Total knee replacement 04/2002  Dr. Dorene Grebe L3,L4 Discectomy-11/2000  Dr. Gerlene Fee Rhinoplasty per Dr. Jearld Fenton Hemorrhoidectomy  Review of Systems  The patient denies anorexia, fever, weight loss, weight gain, vision loss, decreased hearing, hoarseness, chest pain, syncope, dyspnea on exertion, peripheral edema, prolonged cough, headaches, hemoptysis, abdominal pain, melena, hematochezia, severe indigestion/heartburn, hematuria, incontinence, genital sores, muscle weakness, suspicious skin lesions, transient blindness, difficulty walking, depression, unusual weight change, abnormal bleeding, enlarged lymph nodes, angioedema, breast masses, and testicular masses.         Flu Vaccine Consent Questions     Do you have a history of severe allergic reactions to this vaccine? no    Any prior history of  allergic reactions to egg and/or gelatin? no    Do you have a sensitivity to the preservative Thimersol? no    Do you have a past history of Guillan-Barre Syndrome? no    Do you currently have an acute febrile illness? no    Have you ever had a severe reaction to latex? no    Vaccine information given and explained to patient? yes    Are you currently pregnant? no    Lot Number:[id number]   Exp Date:11/17/2010   Site Given  Left Deltoid IM  Pura Spice, RN  February 26, 2010 2:08 PM   Physical Exam  General:  Well-developed,well-nourished,in no acute distress; alert,appropriate and cooperative throughout examination Neck:  No deformities, masses, or tenderness noted. Lungs:  Normal respiratory effort, chest expands symmetrically. Lungs are clear to auscultation, no crackles or wheezes. Heart:  Normal rate and regular rhythm. S1 and S2 normal without gallop, murmur, click, rub or other extra sounds.   Impression & Recommendations:  Problem # 1:  OSTEOARTHRITIS (ICD-715.90)  Her updated medication list for this problem includes:    Celebrex 200 Mg Caps (Celecoxib) .Marland Kitchen... Take 1 tablet by mouth once a day    Vicodin Hp 10-660 Mg Tabs (Hydrocodone-acetaminophen) .Marland Kitchen... 1 q 6 hours as needed pain  Problem # 2:  HYPERTENSION (ICD-401.9)  Her updated medication list for this problem includes:    Enalapril-hydrochlorothiazide 10-25 Mg Tabs (Enalapril-hydrochlorothiazide) .Marland Kitchen... Take 1 tablet by mouth once a day  Problem # 3:  ASTHMA (ICD-493.90)  Her updated medication list for this problem includes:    Xopenex Hfa 45  Mcg/act Aero (Levalbuterol tartrate) .Marland Kitchen... 2 puffs q 4 hours as needed  Problem # 4:  DEPRESSION (ICD-311)  Her updated medication list for this problem includes:    Alprazolam 0.25 Mg Tabs (Alprazolam) .Marland Kitchen... Three times a day as needed    Zoloft 100 Mg Tabs (Sertraline hcl) ..... One tab daily  Complete Medication List: 1)  Enalapril-hydrochlorothiazide 10-25 Mg  Tabs (Enalapril-hydrochlorothiazide) .... Take 1 tablet by mouth once a day 2)  Nexium 40 Mg Cpdr (Esomeprazole magnesium) .... Take 1 tablet by mouth once a day 3)  Multivitamins Caps (Multiple vitamin) .... Take 1 tablet by mouth once a day 4)  Celebrex 200 Mg Caps (Celecoxib) .... Take 1 tablet by mouth once a day 5)  Alprazolam 0.25 Mg Tabs (Alprazolam) .... Three times a day as needed 6)  Simvastatin 40 Mg Tabs (Simvastatin) .... Take 1 tablet by mouth once a day 7)  Actonel 150 Mg Tabs (Risedronate sodium) .... Take 1 tablet by mouth one time per month. 8)  Zoloft 100 Mg Tabs (Sertraline hcl) .... One tab daily 9)  Xopenex Hfa 45 Mcg/act Aero (Levalbuterol tartrate) .... 2 puffs q 4 hours as needed 10)  Acyclovir 400 Mg Tabs (Acyclovir) .Marland Kitchen.. 1 by mouth three times a day as needed for fever blisters 11)  Flexeril 10 Mg Tabs (Cyclobenzaprine hcl) .... Three times a day as needed spasm 12)  Vicodin Hp 10-660 Mg Tabs (Hydrocodone-acetaminophen) .Marland Kitchen.. 1 q 6 hours as needed pain 13)  Vitamin D (ergocalciferol) 50000 Unit Caps (Ergocalciferol) .Marland Kitchen.. 1 by mouth every week 14)  Hydromet 5-1.5 Mg/39ml Syrp (Hydrocodone-homatropine) .Marland Kitchen.. 1 tsp q 4 hours as needed cough  Other Orders: Admin 1st Vaccine (84132) Flu Vaccine 19yrs + (44010)  Patient Instructions: 1)  Please schedule a follow-up appointment as needed .  Prescriptions: ENALAPRIL-HYDROCHLOROTHIAZIDE 10-25 MG TABS (ENALAPRIL-HYDROCHLOROTHIAZIDE) Take 1 tablet by mouth once a day  #30 x 11   Entered and Authorized by:   Nelwyn Salisbury MD   Signed by:   Nelwyn Salisbury MD on 02/26/2010   Method used:   Print then Give to Patient   RxID:   [id number]

## 2010-06-19 NOTE — Assessment & Plan Note (Signed)
Summary: URI?, COUGH, CONGESTION // RS   Vital Signs:  Patient profile:   65 year old female Weight:      198.4 pounds Temp:     98.5 degrees F oral Pulse rate:   83 / minute BP sitting:   144 / 84  (left arm) Cuff size:   large  Vitals Entered By: Alfred Levins, CMA (June 23, 2009 9:36 AM) CC: cough, congestion x1 day   History of Present Illness: Here with 3 days of sinus pressure, HA, ST, and coughing up green sputum. No fever.   Current Medications (verified): 1)  Enalapril-Hydrochlorothiazide 10-25 Mg Tabs (Enalapril-Hydrochlorothiazide) .... Take 1 Tablet By Mouth Once A Day 2)  Nexium 40 Mg Cpdr (Esomeprazole Magnesium) .... Take 1 Tablet By Mouth Once A Day 3)  Multivitamins   Caps (Multiple Vitamin) .... Take 1 Tablet By Mouth Once A Day 4)  Celebrex 200 Mg  Caps (Celecoxib) .... Take 1 Tablet By Mouth Once A Day 5)  Alprazolam 0.25 Mg  Tabs (Alprazolam) .... Three Times A Day As Needed 6)  Simvastatin 40 Mg  Tabs (Simvastatin) .... Take 1 Tablet By Mouth Once A Day 7)  Actonel 150 Mg  Tabs (Risedronate Sodium) .... Take 1 Tablet By Mouth One Time Per Month. 8)  Zoloft 100 Mg Tabs (Sertraline Hcl) .... One Tab Daily 9)  Xopenex Hfa 45 Mcg/act Aero (Levalbuterol Tartrate) .... 2 Puffs Q 4 Hours As Needed 10)  Acyclovir 400 Mg Tabs (Acyclovir) .Marland Kitchen.. 1 By Mouth Three Times A Day As Needed For Fever Blisters 11)  Flexeril 10 Mg Tabs (Cyclobenzaprine Hcl) .... Three Times A Day As Needed Spasm 12)  Vicodin Hp 10-660 Mg Tabs (Hydrocodone-Acetaminophen) .Marland Kitchen.. 1 Q 6 Hours As Needed Pain 13)  Vitamin D (Ergocalciferol) 50000 Unit Caps (Ergocalciferol) .Marland Kitchen.. 1 By Mouth Every Week  Allergies (verified): No Known Drug Allergies  Past History:  Past Medical History: Reviewed history from 05/03/2009 and no changes required. Asthma Depression GERD Hypertension Adenomatous Colon Polyps, hx of Hyperlipidemia Osteoarthritis compression fracture of L2  Osteoporosis per DEXA  2009 hematuria Anxiety Disorder Peptic stricture Hemorrhoids  Review of Systems  The patient denies anorexia, fever, weight loss, weight gain, vision loss, decreased hearing, hoarseness, chest pain, syncope, dyspnea on exertion, peripheral edema, hemoptysis, abdominal pain, melena, hematochezia, severe indigestion/heartburn, hematuria, incontinence, genital sores, muscle weakness, suspicious skin lesions, transient blindness, difficulty walking, depression, unusual weight change, abnormal bleeding, enlarged lymph nodes, angioedema, breast masses, and testicular masses.    Physical Exam  General:  Well-developed,well-nourished,in no acute distress; alert,appropriate and cooperative throughout examination Head:  Normocephalic and atraumatic without obvious abnormalities. No apparent alopecia or balding. Eyes:  No corneal or conjunctival inflammation noted. EOMI. Perrla. Funduscopic exam benign, without hemorrhages, exudates or papilledema. Vision grossly normal. Ears:  External ear exam shows no significant lesions or deformities.  Otoscopic examination reveals clear canals, tympanic membranes are intact bilaterally without bulging, retraction, inflammation or discharge. Hearing is grossly normal bilaterally. Nose:  External nasal examination shows no deformity or inflammation. Nasal mucosa are pink and moist without lesions or exudates. Mouth:  Oral mucosa and oropharynx without lesions or exudates.  Teeth in good repair. Neck:  No deformities, masses, or tenderness noted. Lungs:  Normal respiratory effort, chest expands symmetrically. Lungs are clear to auscultation, no crackles or wheezes.   Impression & Recommendations:  Problem # 1:  ACUTE SINUSITIS, UNSPECIFIED (ICD-461.9)  Her updated medication list for this problem includes:    Hydromet 5-1.5  Mg/28ml Syrp (Hydrocodone-homatropine) .Marland Kitchen... 1 tsp q 4 hours as needed cough    Zithromax Z-pak 250 Mg Tabs (Azithromycin) .Marland Kitchen... As  directed  Complete Medication List: 1)  Enalapril-hydrochlorothiazide 10-25 Mg Tabs (Enalapril-hydrochlorothiazide) .... Take 1 tablet by mouth once a day 2)  Nexium 40 Mg Cpdr (Esomeprazole magnesium) .... Take 1 tablet by mouth once a day 3)  Multivitamins Caps (Multiple vitamin) .... Take 1 tablet by mouth once a day 4)  Celebrex 200 Mg Caps (Celecoxib) .... Take 1 tablet by mouth once a day 5)  Alprazolam 0.25 Mg Tabs (Alprazolam) .... Three times a day as needed 6)  Simvastatin 40 Mg Tabs (Simvastatin) .... Take 1 tablet by mouth once a day 7)  Actonel 150 Mg Tabs (Risedronate sodium) .... Take 1 tablet by mouth one time per month. 8)  Zoloft 100 Mg Tabs (Sertraline hcl) .... One tab daily 9)  Xopenex Hfa 45 Mcg/act Aero (Levalbuterol tartrate) .... 2 puffs q 4 hours as needed 10)  Acyclovir 400 Mg Tabs (Acyclovir) .Marland Kitchen.. 1 by mouth three times a day as needed for fever blisters 11)  Flexeril 10 Mg Tabs (Cyclobenzaprine hcl) .... Three times a day as needed spasm 12)  Vicodin Hp 10-660 Mg Tabs (Hydrocodone-acetaminophen) .Marland Kitchen.. 1 q 6 hours as needed pain 13)  Vitamin D (ergocalciferol) 50000 Unit Caps (Ergocalciferol) .Marland Kitchen.. 1 by mouth every week 14)  Hydromet 5-1.5 Mg/2ml Syrp (Hydrocodone-homatropine) .Marland Kitchen.. 1 tsp q 4 hours as needed cough 15)  Zithromax Z-pak 250 Mg Tabs (Azithromycin) .... As directed  Patient Instructions: 1)  Please schedule a follow-up appointment as needed .  Prescriptions: ZITHROMAX Z-PAK 250 MG TABS (AZITHROMYCIN) as directed  #1 x 0   Entered and Authorized by:   Nelwyn Salisbury MD   Signed by:   Nelwyn Salisbury MD on 06/23/2009   Method used:   Print then Give to Patient   RxID:   [id number] HYDROMET 5-1.5 MG/5ML SYRP (HYDROCODONE-HOMATROPINE) 1 tsp q 4 hours as needed cough  #240 x 0   Entered and Authorized by:   Nelwyn Salisbury MD   Signed by:   Nelwyn Salisbury MD on 06/23/2009   Method used:   Print then Give to Patient   RxID:   [id number]

## 2010-06-19 NOTE — Progress Notes (Signed)
Summary: rx alprazolam   Phone Note From Pharmacy   [phone][facility] Summary of Call: refill apprazolam  Initial call taken by: Pura Spice, RN,  February 21, 2010 4:48 PM  Follow-up for Phone Call        call in #90 with 5 rf Follow-up by: Nelwyn Salisbury MD,  February 22, 2010 2:20 PM    Prescriptions: ALPRAZOLAM 0.25 MG  TABS (ALPRAZOLAM) three times a day as needed  #90 x 5   Entered by:   Kathrynn Speed CMA   Authorized by:   Nelwyn Salisbury MD   Signed by:   Kathrynn Speed CMA on 02/22/2010   Method used:   Telephoned to ...       [facility]  [address] [phone][facility] ([facility]       [address]       [place], [place]  [address]       Ph: [phone] or [phone]       Fax: [phone]   RxID:   [id number]

## 2010-06-19 NOTE — Progress Notes (Signed)
Summary: refill Alprazolam  Phone Note Refill Request Message from:  Fax from Pharmacy on September 11, 2009 1:24 PM  Refills Requested: Medication #1:  ALPRAZOLAM 0.25 MG  TABS three times a day as needed   Dosage confirmed as above?Dosage Confirmed   Supply Requested: 1 month   Last Refilled: 08/12/2009  Method Requested: Telephone to Pharmacy Initial call taken by: Raechel Ache, RN,  September 11, 2009 1:25 PM [phone][facility]  Follow-up for Phone Call        call in #90 with 5 rf Follow-up by: Nelwyn Salisbury MD,  September 12, 2009 8:54 AM  Additional Follow-up for Phone Call Additional follow up Details #1::        Rx faxed to pharmacy Additional Follow-up by: Raechel Ache, RN,  September 12, 2009 10:57 AM    Prescriptions: ALPRAZOLAM 0.25 MG  TABS (ALPRAZOLAM) three times a day as needed  #90 x 5   Entered by:   Raechel Ache, RN   Authorized by:   Nelwyn Salisbury MD   Signed by:   Raechel Ache, RN on 09/12/2009   Method used:   Historical   RxID:   [id number]

## 2010-06-19 NOTE — Letter (Signed)
Summary: [facility]   Imported By: Maryln Gottron 04/06/2010 10:55:39  _____________________________________________________________________  External Attachment:    Type:   Image     Comment:   External Document

## 2010-06-19 NOTE — Letter (Signed)
Summary: [facility]   Imported By: Maryln Gottron 04/25/2010 15:22:55  _____________________________________________________________________  External Attachment:    Type:   Image     Comment:   External Document

## 2010-06-21 ENCOUNTER — Ambulatory Visit (HOSPITAL_BASED_OUTPATIENT_CLINIC_OR_DEPARTMENT_OTHER): Payer: Managed Care, Other (non HMO) | Admitting: Oncology

## 2010-06-21 DIAGNOSIS — C50219 Malignant neoplasm of upper-inner quadrant of unspecified female breast: Secondary | ICD-10-CM

## 2010-06-21 DIAGNOSIS — C50919 Malignant neoplasm of unspecified site of unspecified female breast: Secondary | ICD-10-CM

## 2010-06-21 DIAGNOSIS — D696 Thrombocytopenia, unspecified: Secondary | ICD-10-CM

## 2010-06-21 DIAGNOSIS — Z5111 Encounter for antineoplastic chemotherapy: Secondary | ICD-10-CM

## 2010-06-21 LAB — COMPREHENSIVE METABOLIC PANEL
ALT: 11 U/L (ref 0–35)
Alkaline Phosphatase: 92 U/L (ref 39–117)
Potassium: 4 mEq/L (ref 3.5–5.3)
Sodium: 142 mEq/L (ref 135–145)
Total Bilirubin: 0.3 mg/dL (ref 0.3–1.2)
Total Protein: 6 g/dL (ref 6.0–8.3)

## 2010-06-21 LAB — CBC WITH DIFFERENTIAL/PLATELET
BASO%: 1.7 % (ref 0.0–2.0)
LYMPH%: 10.7 % — ABNORMAL LOW (ref 14.0–49.7)
MCHC: 31.8 g/dL (ref 31.5–36.0)
MCV: 90.5 fL (ref 79.5–101.0)
MONO#: 0.8 10*3/uL (ref 0.1–0.9)
MONO%: 11.9 % (ref 0.0–14.0)
Platelets: 157 10*3/uL (ref 145–400)
RBC: 3.16 10*6/uL — ABNORMAL LOW (ref 3.70–5.45)
RDW: 21.2 % — ABNORMAL HIGH (ref 11.2–14.5)
WBC: 6.5 10*3/uL (ref 3.9–10.3)

## 2010-06-21 NOTE — Letter (Signed)
Summary: [facility]   [facility]   Imported By: Maryln Gottron 05/15/2010 09:38:48  _____________________________________________________________________  External Attachment:    Type:   Image     Comment:   External Document

## 2010-06-21 NOTE — Progress Notes (Signed)
Summary: refill  Phone Note From Pharmacy   [facility]  [address] [phone] Call For: Dr. Scotty Court  Summary of Call: Pt needs new prescription for xanax.  States she is taking qid instead of three times a day. [facility] Initial call taken by: [facility] CMA AAMA,  May 07, 2010 3:21 PM    Prescriptions: ALPRAZOLAM 0.25 MG  TABS (ALPRAZOLAM) three times a day as needed  #90 x 5   Entered by:   Kern Reap CMA (AAMA)   Authorized by:   Judithann Sheen MD   Signed by:   Kern Reap CMA (AAMA) on 05/10/2010   Method used:   Telephoned to ...       [facility]  [address] [phone]* (retail)       [address]       [place], [place]  [address]       Ph: [phone] or [phone]       Fax: [phone]   RxID:   [id number]

## 2010-06-21 NOTE — Letter (Signed)
Summary: [facility]  [facility]   Imported By: Maryln Gottron 04/25/2010 11:21:38  _____________________________________________________________________  External Attachment:    Type:   Image     Comment:   External Document

## 2010-06-21 NOTE — Letter (Signed)
Summary: [facility]   Imported By: Maryln Gottron 05/17/2010 10:55:20  _____________________________________________________________________  External Attachment:    Type:   Image     Comment:   External Document

## 2010-06-21 NOTE — Letter (Signed)
Summary: [facility]  [facility]   Imported By: Maryln Gottron 05/03/2010 10:00:18  _____________________________________________________________________  External Attachment:    Type:   Image     Comment:   External Document

## 2010-06-21 NOTE — Letter (Signed)
Summary: [facility]  [facility]   Imported By: Maryln Gottron 05/22/2010 12:24:24  _____________________________________________________________________  External Attachment:    Type:   Image     Comment:   External Document

## 2010-06-25 ENCOUNTER — Encounter: Payer: Self-pay | Admitting: Gastroenterology

## 2010-06-27 NOTE — Letter (Signed)
Summary: [facility]  [facility]   Imported By: Maryln Gottron 06/18/2010 12:24:53  _____________________________________________________________________  External Attachment:    Type:   Image     Comment:   External Document

## 2010-06-27 NOTE — Letter (Signed)
Summary: [facility]  [facility]   Imported By: Maryln Gottron 06/18/2010 12:25:51  _____________________________________________________________________  External Attachment:    Type:   Image     Comment:   External Document

## 2010-06-28 ENCOUNTER — Other Ambulatory Visit: Payer: Self-pay | Admitting: Oncology

## 2010-06-28 ENCOUNTER — Encounter (HOSPITAL_BASED_OUTPATIENT_CLINIC_OR_DEPARTMENT_OTHER): Payer: Managed Care, Other (non HMO) | Admitting: Oncology

## 2010-06-28 ENCOUNTER — Encounter: Payer: Self-pay | Admitting: Gastroenterology

## 2010-06-28 ENCOUNTER — Encounter (HOSPITAL_COMMUNITY)
Admission: RE | Admit: 2010-06-28 | Discharge: 2010-06-28 | Disposition: A | Discharge status: Home / Self Care | Payer: Managed Care, Other (non HMO) | Source: Ambulatory Visit | Attending: Hematology & Oncology | Admitting: Hematology & Oncology

## 2010-06-28 DIAGNOSIS — D696 Thrombocytopenia, unspecified: Secondary | ICD-10-CM

## 2010-06-28 DIAGNOSIS — C50219 Malignant neoplasm of upper-inner quadrant of unspecified female breast: Secondary | ICD-10-CM

## 2010-06-28 DIAGNOSIS — D649 Anemia, unspecified: Secondary | ICD-10-CM | POA: Insufficient documentation

## 2010-06-28 DIAGNOSIS — Z5111 Encounter for antineoplastic chemotherapy: Secondary | ICD-10-CM

## 2010-06-28 DIAGNOSIS — C50919 Malignant neoplasm of unspecified site of unspecified female breast: Secondary | ICD-10-CM

## 2010-06-28 LAB — CBC WITH DIFFERENTIAL/PLATELET
BASO%: 1.1 % (ref 0.0–2.0)
EOS%: 1.3 % (ref 0.0–7.0)
LYMPH%: 12.5 % — ABNORMAL LOW (ref 14.0–49.7)
MCH: 29.4 pg (ref 25.1–34.0)
MCHC: 32.8 g/dL (ref 31.5–36.0)
MCV: 89.4 fL (ref 79.5–101.0)
MONO%: 6.8 % (ref 0.0–14.0)
Platelets: 224 10*3/uL (ref 145–400)
RBC: 2.93 10*6/uL — ABNORMAL LOW (ref 3.70–5.45)
nRBC: 0 % (ref 0–0)

## 2010-06-28 LAB — COMPREHENSIVE METABOLIC PANEL
AST: 19 U/L (ref 0–37)
Albumin: 4 g/dL (ref 3.5–5.2)
Alkaline Phosphatase: 69 U/L (ref 39–117)
BUN: 16 mg/dL (ref 6–23)
Calcium: 8.4 mg/dL (ref 8.4–10.5)
Creatinine, Ser: 0.75 mg/dL (ref 0.40–1.20)
Glucose, Bld: 114 mg/dL — ABNORMAL HIGH (ref 70–99)
Potassium: 3.6 mEq/L (ref 3.5–5.3)

## 2010-06-28 LAB — VITAMIN B12: Vitamin B-12: 702 pg/mL (ref 211–911)

## 2010-06-28 LAB — IRON AND TIBC: TIBC: 292 ug/dL (ref 250–470)

## 2010-06-29 ENCOUNTER — Encounter (HOSPITAL_COMMUNITY)
Admission: RE | Admit: 2010-06-29 | Discharge: 2010-06-29 | Disposition: A | Discharge status: Home / Self Care | Payer: Managed Care, Other (non HMO) | Source: Ambulatory Visit | Attending: Oncology | Admitting: Oncology

## 2010-06-29 ENCOUNTER — Encounter (HOSPITAL_BASED_OUTPATIENT_CLINIC_OR_DEPARTMENT_OTHER): Payer: Managed Care, Other (non HMO) | Admitting: Oncology

## 2010-06-29 DIAGNOSIS — C50219 Malignant neoplasm of upper-inner quadrant of unspecified female breast: Secondary | ICD-10-CM

## 2010-06-29 DIAGNOSIS — D649 Anemia, unspecified: Secondary | ICD-10-CM

## 2010-07-03 ENCOUNTER — Encounter (HOSPITAL_COMMUNITY): Payer: Managed Care, Other (non HMO)

## 2010-07-04 LAB — CROSSMATCH: Antibody Screen: NEGATIVE

## 2010-07-04 LAB — ABO/RH: ABO/RH(D): O POS

## 2010-07-05 ENCOUNTER — Other Ambulatory Visit: Payer: Self-pay | Admitting: Oncology

## 2010-07-05 ENCOUNTER — Encounter (HOSPITAL_BASED_OUTPATIENT_CLINIC_OR_DEPARTMENT_OTHER): Payer: Managed Care, Other (non HMO) | Admitting: Oncology

## 2010-07-05 ENCOUNTER — Encounter: Payer: Self-pay | Admitting: Gastroenterology

## 2010-07-05 DIAGNOSIS — C50919 Malignant neoplasm of unspecified site of unspecified female breast: Secondary | ICD-10-CM

## 2010-07-05 DIAGNOSIS — D696 Thrombocytopenia, unspecified: Secondary | ICD-10-CM

## 2010-07-05 DIAGNOSIS — C50219 Malignant neoplasm of upper-inner quadrant of unspecified female breast: Secondary | ICD-10-CM

## 2010-07-05 DIAGNOSIS — Z5111 Encounter for antineoplastic chemotherapy: Secondary | ICD-10-CM

## 2010-07-05 DIAGNOSIS — D649 Anemia, unspecified: Secondary | ICD-10-CM

## 2010-07-05 LAB — BASIC METABOLIC PANEL
Calcium: 9 mg/dL (ref 8.4–10.5)
Glucose, Bld: 117 mg/dL — ABNORMAL HIGH (ref 70–99)
Sodium: 139 mEq/L (ref 135–145)

## 2010-07-05 LAB — CBC WITH DIFFERENTIAL/PLATELET
BASO%: 2.5 % — ABNORMAL HIGH (ref 0.0–2.0)
LYMPH%: 14.8 % (ref 14.0–49.7)
MCHC: 33 g/dL (ref 31.5–36.0)
MCV: 88 fL (ref 79.5–101.0)
MONO%: 7.9 % (ref 0.0–14.0)
Platelets: 165 10*3/uL (ref 145–400)
RBC: 3.82 10*6/uL (ref 3.70–5.45)

## 2010-07-12 ENCOUNTER — Encounter: Payer: Self-pay | Admitting: Gastroenterology

## 2010-07-12 ENCOUNTER — Other Ambulatory Visit: Payer: Self-pay | Admitting: Oncology

## 2010-07-12 ENCOUNTER — Encounter (HOSPITAL_BASED_OUTPATIENT_CLINIC_OR_DEPARTMENT_OTHER): Payer: Managed Care, Other (non HMO) | Admitting: Oncology

## 2010-07-12 DIAGNOSIS — Z5111 Encounter for antineoplastic chemotherapy: Secondary | ICD-10-CM

## 2010-07-12 DIAGNOSIS — Z171 Estrogen receptor negative status [ER-]: Secondary | ICD-10-CM

## 2010-07-12 DIAGNOSIS — C50219 Malignant neoplasm of upper-inner quadrant of unspecified female breast: Secondary | ICD-10-CM

## 2010-07-12 DIAGNOSIS — C50919 Malignant neoplasm of unspecified site of unspecified female breast: Secondary | ICD-10-CM

## 2010-07-12 DIAGNOSIS — D696 Thrombocytopenia, unspecified: Secondary | ICD-10-CM

## 2010-07-12 LAB — CBC WITH DIFFERENTIAL/PLATELET
Eosinophils Absolute: 0.1 10*3/uL (ref 0.0–0.5)
HCT: 32.5 % — ABNORMAL LOW (ref 34.8–46.6)
LYMPH%: 10.9 % — ABNORMAL LOW (ref 14.0–49.7)
MCV: 88.1 fL (ref 79.5–101.0)
MONO%: 12.7 % (ref 0.0–14.0)
NEUT#: 2.9 10*3/uL (ref 1.5–6.5)
NEUT%: 73.1 % (ref 38.4–76.8)
Platelets: 146 10*3/uL (ref 145–400)
RBC: 3.69 10*6/uL — ABNORMAL LOW (ref 3.70–5.45)
nRBC: 0 % (ref 0–0)

## 2010-07-12 LAB — BASIC METABOLIC PANEL
BUN: 12 mg/dL (ref 6–23)
CO2: 29 mEq/L (ref 19–32)
Calcium: 9 mg/dL (ref 8.4–10.5)
Creatinine, Ser: 0.68 mg/dL (ref 0.40–1.20)
Glucose, Bld: 94 mg/dL (ref 70–99)

## 2010-07-19 ENCOUNTER — Encounter (HOSPITAL_BASED_OUTPATIENT_CLINIC_OR_DEPARTMENT_OTHER): Payer: Managed Care, Other (non HMO) | Admitting: Oncology

## 2010-07-19 ENCOUNTER — Other Ambulatory Visit: Payer: Self-pay | Admitting: Oncology

## 2010-07-19 DIAGNOSIS — C50919 Malignant neoplasm of unspecified site of unspecified female breast: Secondary | ICD-10-CM

## 2010-07-19 DIAGNOSIS — Z5111 Encounter for antineoplastic chemotherapy: Secondary | ICD-10-CM

## 2010-07-19 DIAGNOSIS — C50219 Malignant neoplasm of upper-inner quadrant of unspecified female breast: Secondary | ICD-10-CM

## 2010-07-19 DIAGNOSIS — Z171 Estrogen receptor negative status [ER-]: Secondary | ICD-10-CM

## 2010-07-19 DIAGNOSIS — D696 Thrombocytopenia, unspecified: Secondary | ICD-10-CM

## 2010-07-19 LAB — COMPREHENSIVE METABOLIC PANEL
AST: 25 U/L (ref 0–37)
Albumin: 4.2 g/dL (ref 3.5–5.2)
Alkaline Phosphatase: 85 U/L (ref 39–117)
Potassium: 3.3 mEq/L — ABNORMAL LOW (ref 3.5–5.3)
Sodium: 139 mEq/L (ref 135–145)
Total Bilirubin: 0.6 mg/dL (ref 0.3–1.2)
Total Protein: 6.7 g/dL (ref 6.0–8.3)

## 2010-07-19 LAB — CBC WITH DIFFERENTIAL/PLATELET
BASO%: 1 % (ref 0.0–2.0)
EOS%: 1.3 % (ref 0.0–7.0)
MCH: 29.7 pg (ref 25.1–34.0)
MCHC: 34 g/dL (ref 31.5–36.0)
MONO%: 9.1 % (ref 0.0–14.0)
RBC: 3.47 10*6/uL — ABNORMAL LOW (ref 3.70–5.45)
RDW: 19.1 % — ABNORMAL HIGH (ref 11.2–14.5)
lymph#: 0.5 10*3/uL — ABNORMAL LOW (ref 0.9–3.3)

## 2010-07-26 ENCOUNTER — Encounter (HOSPITAL_BASED_OUTPATIENT_CLINIC_OR_DEPARTMENT_OTHER): Payer: Managed Care, Other (non HMO) | Admitting: Oncology

## 2010-07-26 ENCOUNTER — Other Ambulatory Visit: Payer: Self-pay | Admitting: Oncology

## 2010-07-26 DIAGNOSIS — C50919 Malignant neoplasm of unspecified site of unspecified female breast: Secondary | ICD-10-CM

## 2010-07-26 DIAGNOSIS — Z5111 Encounter for antineoplastic chemotherapy: Secondary | ICD-10-CM

## 2010-07-26 DIAGNOSIS — Z171 Estrogen receptor negative status [ER-]: Secondary | ICD-10-CM

## 2010-07-26 DIAGNOSIS — D696 Thrombocytopenia, unspecified: Secondary | ICD-10-CM

## 2010-07-26 DIAGNOSIS — C50219 Malignant neoplasm of upper-inner quadrant of unspecified female breast: Secondary | ICD-10-CM

## 2010-07-26 LAB — CBC WITH DIFFERENTIAL/PLATELET
Basophils Absolute: 0 10*3/uL (ref 0.0–0.1)
EOS%: 1.2 % (ref 0.0–7.0)
Eosinophils Absolute: 0 10*3/uL (ref 0.0–0.5)
HGB: 10.1 g/dL — ABNORMAL LOW (ref 11.6–15.9)
LYMPH%: 23.2 % (ref 14.0–49.7)
MCH: 30.3 pg (ref 25.1–34.0)
MCV: 88.6 fL (ref 79.5–101.0)
MONO%: 8.3 % (ref 0.0–14.0)
Platelets: 118 10*3/uL — ABNORMAL LOW (ref 145–400)
RBC: 3.33 10*6/uL — ABNORMAL LOW (ref 3.70–5.45)
RDW: 18.2 % — ABNORMAL HIGH (ref 11.2–14.5)

## 2010-07-26 LAB — BASIC METABOLIC PANEL
CO2: 27 mEq/L (ref 19–32)
Calcium: 8.8 mg/dL (ref 8.4–10.5)
Creatinine, Ser: 0.78 mg/dL (ref 0.40–1.20)
Glucose, Bld: 119 mg/dL — ABNORMAL HIGH (ref 70–99)
Sodium: 137 mEq/L (ref 135–145)

## 2010-07-26 NOTE — Letter (Signed)
Summary: [facility]  [facility]   Imported By: Lennie Odor 07/20/2010 11:47:47  _____________________________________________________________________  External Attachment:    Type:   Image     Comment:   External Document

## 2010-07-30 LAB — POCT I-STAT, CHEM 8
BUN: 27 mg/dL — ABNORMAL HIGH (ref 6–23)
Calcium, Ion: 1.08 mmol/L — ABNORMAL LOW (ref 1.12–1.32)
HCT: 39 % (ref 36.0–46.0)
Hemoglobin: 13.3 g/dL (ref 12.0–15.0)
Sodium: 139 mEq/L (ref 135–145)
TCO2: 27 mmol/L (ref 0–100)

## 2010-07-31 LAB — ABO/RH: ABO/RH(D): O POS

## 2010-07-31 LAB — TYPE AND SCREEN: ABO/RH(D): O POS

## 2010-07-31 NOTE — Letter (Signed)
Summary: [facility]  [facility]   Imported By: Lennie Odor 07/27/2010 10:23:00  _____________________________________________________________________  External Attachment:    Type:   Image     Comment:   External Document

## 2010-07-31 NOTE — Letter (Signed)
Summary: [facility]  [facility]   Imported By: Lennie Odor 07/27/2010 10:09:26  _____________________________________________________________________  External Attachment:    Type:   Image     Comment:   External Document

## 2010-07-31 NOTE — Letter (Signed)
Summary: [facility]  [facility]   Imported By: Lennie Odor 07/27/2010 10:07:01  _____________________________________________________________________  External Attachment:    Type:   Image     Comment:   External Document

## 2010-08-01 LAB — DIFFERENTIAL
Basophils Relative: 1 % (ref 0–1)
Eosinophils Absolute: 0.2 10*3/uL (ref 0.0–0.7)
Lymphs Abs: 1.2 10*3/uL (ref 0.7–4.0)
Neutro Abs: 3.5 10*3/uL (ref 1.7–7.7)
Neutrophils Relative %: 66 % (ref 43–77)

## 2010-08-01 LAB — COMPREHENSIVE METABOLIC PANEL
ALT: 19 U/L (ref 0–35)
BUN: 23 mg/dL (ref 6–23)
CO2: 31 mEq/L (ref 19–32)
Calcium: 9.4 mg/dL (ref 8.4–10.5)
GFR calc non Af Amer: >60 mL/min (ref >60)
Glucose, Bld: 109 mg/dL — ABNORMAL HIGH (ref 70–99)
Sodium: 139 mEq/L (ref 135–145)
Total Protein: 6.8 g/dL (ref 6.0–8.3)

## 2010-08-01 LAB — CBC
HCT: 39.4 % (ref 36.0–46.0)
Hemoglobin: 13 g/dL (ref 12.0–15.0)
MCH: 28.9 pg (ref 26.0–34.0)
MCHC: 33 g/dL (ref 30.0–36.0)
MCV: 87.6 fL (ref 78.0–100.0)
RDW: 13 % (ref 11.5–15.5)

## 2010-08-01 LAB — PROTIME-INR
INR: 0.92 (ref 0.00–1.49)
Prothrombin Time: 12.6 seconds (ref 11.6–15.2)

## 2010-08-01 LAB — SURGICAL PCR SCREEN: Staphylococcus aureus: POSITIVE — AB

## 2010-08-02 ENCOUNTER — Other Ambulatory Visit: Payer: Self-pay | Admitting: Oncology

## 2010-08-02 ENCOUNTER — Encounter (HOSPITAL_BASED_OUTPATIENT_CLINIC_OR_DEPARTMENT_OTHER): Payer: Managed Care, Other (non HMO) | Admitting: Oncology

## 2010-08-02 DIAGNOSIS — Z5189 Encounter for other specified aftercare: Secondary | ICD-10-CM

## 2010-08-02 DIAGNOSIS — Z5111 Encounter for antineoplastic chemotherapy: Secondary | ICD-10-CM

## 2010-08-02 DIAGNOSIS — C50919 Malignant neoplasm of unspecified site of unspecified female breast: Secondary | ICD-10-CM

## 2010-08-02 DIAGNOSIS — C50219 Malignant neoplasm of upper-inner quadrant of unspecified female breast: Secondary | ICD-10-CM

## 2010-08-02 DIAGNOSIS — D696 Thrombocytopenia, unspecified: Secondary | ICD-10-CM

## 2010-08-02 LAB — CBC WITH DIFFERENTIAL/PLATELET
BASO%: 0.6 % (ref 0.0–2.0)
LYMPH%: 34.5 % (ref 14.0–49.7)
MCHC: 33.8 g/dL (ref 31.5–36.0)
MCV: 90.3 fL (ref 79.5–101.0)
MONO%: 8.3 % (ref 0.0–14.0)
Platelets: 125 10*3/uL — ABNORMAL LOW (ref 145–400)
RBC: 3.18 10*6/uL — ABNORMAL LOW (ref 3.70–5.45)
RDW: 17.7 % — ABNORMAL HIGH (ref 11.2–14.5)
WBC: 1.7 10*3/uL — ABNORMAL LOW (ref 3.9–10.3)
nRBC: 0 % (ref 0–0)

## 2010-08-03 ENCOUNTER — Encounter: Payer: Managed Care, Other (non HMO) | Admitting: Oncology

## 2010-08-04 ENCOUNTER — Encounter (HOSPITAL_BASED_OUTPATIENT_CLINIC_OR_DEPARTMENT_OTHER): Payer: Managed Care, Other (non HMO) | Admitting: Oncology

## 2010-08-04 DIAGNOSIS — Z5189 Encounter for other specified aftercare: Secondary | ICD-10-CM

## 2010-08-04 DIAGNOSIS — C50219 Malignant neoplasm of upper-inner quadrant of unspecified female breast: Secondary | ICD-10-CM

## 2010-08-05 ENCOUNTER — Encounter (HOSPITAL_BASED_OUTPATIENT_CLINIC_OR_DEPARTMENT_OTHER): Payer: Managed Care, Other (non HMO) | Admitting: Oncology

## 2010-08-05 DIAGNOSIS — Z5189 Encounter for other specified aftercare: Secondary | ICD-10-CM

## 2010-08-05 DIAGNOSIS — C50219 Malignant neoplasm of upper-inner quadrant of unspecified female breast: Secondary | ICD-10-CM

## 2010-08-06 ENCOUNTER — Other Ambulatory Visit: Payer: Self-pay | Admitting: Oncology

## 2010-08-06 ENCOUNTER — Encounter (HOSPITAL_BASED_OUTPATIENT_CLINIC_OR_DEPARTMENT_OTHER): Payer: Managed Care, Other (non HMO) | Admitting: Oncology

## 2010-08-06 DIAGNOSIS — Z5111 Encounter for antineoplastic chemotherapy: Secondary | ICD-10-CM

## 2010-08-06 DIAGNOSIS — C50919 Malignant neoplasm of unspecified site of unspecified female breast: Secondary | ICD-10-CM

## 2010-08-06 DIAGNOSIS — J329 Chronic sinusitis, unspecified: Secondary | ICD-10-CM

## 2010-08-06 DIAGNOSIS — C50219 Malignant neoplasm of upper-inner quadrant of unspecified female breast: Secondary | ICD-10-CM

## 2010-08-06 DIAGNOSIS — D696 Thrombocytopenia, unspecified: Secondary | ICD-10-CM

## 2010-08-06 LAB — CBC WITH DIFFERENTIAL/PLATELET
BASO%: 0.6 % (ref 0.0–2.0)
EOS%: 0.6 % (ref 0.0–7.0)
MCH: 30.9 pg (ref 25.1–34.0)
MCHC: 33.2 g/dL (ref 31.5–36.0)
RBC: 3.04 10*6/uL — ABNORMAL LOW (ref 3.70–5.45)
RDW: 20 % — ABNORMAL HIGH (ref 11.2–14.5)
lymph#: 0.5 10*3/uL — ABNORMAL LOW (ref 0.9–3.3)
nRBC: 1 % — ABNORMAL HIGH (ref 0–0)

## 2010-08-06 LAB — BASIC METABOLIC PANEL
BUN: 12 mg/dL (ref 6–23)
Potassium: 3 mEq/L — ABNORMAL LOW (ref 3.5–5.3)
Sodium: 139 mEq/L (ref 135–145)

## 2010-08-07 ENCOUNTER — Encounter (HOSPITAL_BASED_OUTPATIENT_CLINIC_OR_DEPARTMENT_OTHER): Payer: Managed Care, Other (non HMO) | Admitting: Oncology

## 2010-08-07 DIAGNOSIS — C50219 Malignant neoplasm of upper-inner quadrant of unspecified female breast: Secondary | ICD-10-CM

## 2010-08-07 DIAGNOSIS — Z5189 Encounter for other specified aftercare: Secondary | ICD-10-CM

## 2010-08-08 ENCOUNTER — Encounter (HOSPITAL_BASED_OUTPATIENT_CLINIC_OR_DEPARTMENT_OTHER): Payer: Managed Care, Other (non HMO) | Admitting: Oncology

## 2010-08-08 DIAGNOSIS — C50219 Malignant neoplasm of upper-inner quadrant of unspecified female breast: Secondary | ICD-10-CM

## 2010-08-09 ENCOUNTER — Encounter (HOSPITAL_BASED_OUTPATIENT_CLINIC_OR_DEPARTMENT_OTHER): Payer: Managed Care, Other (non HMO) | Admitting: Oncology

## 2010-08-09 DIAGNOSIS — C50219 Malignant neoplasm of upper-inner quadrant of unspecified female breast: Secondary | ICD-10-CM

## 2010-08-09 DIAGNOSIS — Z5189 Encounter for other specified aftercare: Secondary | ICD-10-CM

## 2010-08-13 ENCOUNTER — Other Ambulatory Visit: Payer: Self-pay | Admitting: Oncology

## 2010-08-13 ENCOUNTER — Encounter (HOSPITAL_BASED_OUTPATIENT_CLINIC_OR_DEPARTMENT_OTHER): Payer: Managed Care, Other (non HMO) | Admitting: Oncology

## 2010-08-13 DIAGNOSIS — C50219 Malignant neoplasm of upper-inner quadrant of unspecified female breast: Secondary | ICD-10-CM

## 2010-08-13 DIAGNOSIS — C50919 Malignant neoplasm of unspecified site of unspecified female breast: Secondary | ICD-10-CM

## 2010-08-13 DIAGNOSIS — D696 Thrombocytopenia, unspecified: Secondary | ICD-10-CM

## 2010-08-13 DIAGNOSIS — Z5111 Encounter for antineoplastic chemotherapy: Secondary | ICD-10-CM

## 2010-08-13 LAB — CBC WITH DIFFERENTIAL/PLATELET
Eosinophils Absolute: 0 10*3/uL (ref 0.0–0.5)
HCT: 28.3 % — ABNORMAL LOW (ref 34.8–46.6)
LYMPH%: 53.8 % — ABNORMAL HIGH (ref 14.0–49.7)
MONO#: 0.1 10*3/uL (ref 0.1–0.9)
NEUT#: 0.3 10*3/uL — CL (ref 1.5–6.5)
Platelets: 123 10*3/uL — ABNORMAL LOW (ref 145–400)
RBC: 3.02 10*6/uL — ABNORMAL LOW (ref 3.70–5.45)
WBC: 1.1 10*3/uL — ABNORMAL LOW (ref 3.9–10.3)

## 2010-08-14 ENCOUNTER — Encounter (HOSPITAL_BASED_OUTPATIENT_CLINIC_OR_DEPARTMENT_OTHER): Payer: Managed Care, Other (non HMO) | Admitting: Oncology

## 2010-08-14 DIAGNOSIS — C50219 Malignant neoplasm of upper-inner quadrant of unspecified female breast: Secondary | ICD-10-CM

## 2010-08-15 ENCOUNTER — Encounter (HOSPITAL_BASED_OUTPATIENT_CLINIC_OR_DEPARTMENT_OTHER): Payer: Managed Care, Other (non HMO) | Admitting: Oncology

## 2010-08-15 DIAGNOSIS — C50219 Malignant neoplasm of upper-inner quadrant of unspecified female breast: Secondary | ICD-10-CM

## 2010-08-16 ENCOUNTER — Encounter (HOSPITAL_BASED_OUTPATIENT_CLINIC_OR_DEPARTMENT_OTHER): Payer: Managed Care, Other (non HMO) | Admitting: Oncology

## 2010-08-16 DIAGNOSIS — C50219 Malignant neoplasm of upper-inner quadrant of unspecified female breast: Secondary | ICD-10-CM

## 2010-08-17 ENCOUNTER — Encounter (HOSPITAL_BASED_OUTPATIENT_CLINIC_OR_DEPARTMENT_OTHER): Payer: Managed Care, Other (non HMO) | Admitting: Oncology

## 2010-08-17 DIAGNOSIS — C50219 Malignant neoplasm of upper-inner quadrant of unspecified female breast: Secondary | ICD-10-CM

## 2010-08-18 ENCOUNTER — Encounter (HOSPITAL_BASED_OUTPATIENT_CLINIC_OR_DEPARTMENT_OTHER): Payer: Managed Care, Other (non HMO) | Admitting: Oncology

## 2010-08-18 DIAGNOSIS — C50219 Malignant neoplasm of upper-inner quadrant of unspecified female breast: Secondary | ICD-10-CM

## 2010-08-19 ENCOUNTER — Encounter (HOSPITAL_BASED_OUTPATIENT_CLINIC_OR_DEPARTMENT_OTHER): Payer: Medicare HMO | Admitting: Oncology

## 2010-08-19 DIAGNOSIS — C50219 Malignant neoplasm of upper-inner quadrant of unspecified female breast: Secondary | ICD-10-CM

## 2010-08-20 ENCOUNTER — Other Ambulatory Visit: Payer: Self-pay | Admitting: Oncology

## 2010-08-20 ENCOUNTER — Encounter (HOSPITAL_BASED_OUTPATIENT_CLINIC_OR_DEPARTMENT_OTHER): Payer: Medicare HMO | Admitting: Oncology

## 2010-08-20 DIAGNOSIS — Z5111 Encounter for antineoplastic chemotherapy: Secondary | ICD-10-CM

## 2010-08-20 DIAGNOSIS — C50919 Malignant neoplasm of unspecified site of unspecified female breast: Secondary | ICD-10-CM

## 2010-08-20 DIAGNOSIS — D696 Thrombocytopenia, unspecified: Secondary | ICD-10-CM

## 2010-08-20 DIAGNOSIS — C50219 Malignant neoplasm of upper-inner quadrant of unspecified female breast: Secondary | ICD-10-CM

## 2010-08-20 LAB — COMPREHENSIVE METABOLIC PANEL
BUN: 8 mg/dL (ref 6–23)
CO2: 31 mEq/L (ref 19–32)
Calcium: 9 mg/dL (ref 8.4–10.5)
Chloride: 100 mEq/L (ref 96–112)
Creatinine, Ser: 0.74 mg/dL (ref 0.40–1.20)
Glucose, Bld: 141 mg/dL — ABNORMAL HIGH (ref 70–99)

## 2010-08-20 LAB — CBC WITH DIFFERENTIAL/PLATELET
Eosinophils Absolute: 0 10*3/uL (ref 0.0–0.5)
HCT: 32.8 % — ABNORMAL LOW (ref 34.8–46.6)
LYMPH%: 7.4 % — ABNORMAL LOW (ref 14.0–49.7)
MCHC: 32.9 g/dL (ref 31.5–36.0)
MONO#: 1.4 10*3/uL — ABNORMAL HIGH (ref 0.1–0.9)
NEUT#: 26.4 10*3/uL — ABNORMAL HIGH (ref 1.5–6.5)
NEUT%: 87.4 % — ABNORMAL HIGH (ref 38.4–76.8)
Platelets: 155 10*3/uL (ref 145–400)
WBC: 30.2 10*3/uL — ABNORMAL HIGH (ref 3.9–10.3)

## 2010-08-27 ENCOUNTER — Encounter (HOSPITAL_BASED_OUTPATIENT_CLINIC_OR_DEPARTMENT_OTHER): Payer: Managed Care, Other (non HMO) | Admitting: Oncology

## 2010-08-27 ENCOUNTER — Other Ambulatory Visit: Payer: Self-pay | Admitting: Oncology

## 2010-08-27 DIAGNOSIS — C50219 Malignant neoplasm of upper-inner quadrant of unspecified female breast: Secondary | ICD-10-CM

## 2010-08-27 DIAGNOSIS — Z5111 Encounter for antineoplastic chemotherapy: Secondary | ICD-10-CM

## 2010-08-27 DIAGNOSIS — C50919 Malignant neoplasm of unspecified site of unspecified female breast: Secondary | ICD-10-CM

## 2010-08-27 DIAGNOSIS — D696 Thrombocytopenia, unspecified: Secondary | ICD-10-CM

## 2010-08-27 LAB — CBC WITH DIFFERENTIAL/PLATELET
BASO%: 3.7 % — ABNORMAL HIGH (ref 0.0–2.0)
Basophils Absolute: 0.1 10*3/uL (ref 0.0–0.1)
HCT: 26.9 % — ABNORMAL LOW (ref 34.8–46.6)
HGB: 9.6 g/dL — ABNORMAL LOW (ref 11.6–15.9)
MONO#: 0.3 10*3/uL (ref 0.1–0.9)
NEUT%: 54 % (ref 38.4–76.8)
WBC: 3.3 10*3/uL — ABNORMAL LOW (ref 3.9–10.3)
lymph#: 1.1 10*3/uL (ref 0.9–3.3)

## 2010-08-27 LAB — BASIC METABOLIC PANEL
BUN: 14 mg/dL (ref 6–23)
CO2: 32 mEq/L (ref 19–32)
Chloride: 101 mEq/L (ref 96–112)
Creatinine, Ser: 0.8 mg/dL (ref 0.40–1.20)
Potassium: 3 mEq/L — ABNORMAL LOW (ref 3.5–5.3)

## 2010-08-28 ENCOUNTER — Encounter (HOSPITAL_BASED_OUTPATIENT_CLINIC_OR_DEPARTMENT_OTHER): Payer: Medicare HMO | Admitting: Oncology

## 2010-08-28 DIAGNOSIS — C50219 Malignant neoplasm of upper-inner quadrant of unspecified female breast: Secondary | ICD-10-CM

## 2010-08-29 ENCOUNTER — Encounter (HOSPITAL_BASED_OUTPATIENT_CLINIC_OR_DEPARTMENT_OTHER): Payer: Medicare HMO | Admitting: Oncology

## 2010-08-29 DIAGNOSIS — C50219 Malignant neoplasm of upper-inner quadrant of unspecified female breast: Secondary | ICD-10-CM

## 2010-08-30 ENCOUNTER — Encounter (HOSPITAL_BASED_OUTPATIENT_CLINIC_OR_DEPARTMENT_OTHER): Payer: Medicare HMO | Admitting: Oncology

## 2010-08-30 DIAGNOSIS — C50219 Malignant neoplasm of upper-inner quadrant of unspecified female breast: Secondary | ICD-10-CM

## 2010-09-03 ENCOUNTER — Other Ambulatory Visit: Payer: Self-pay | Admitting: Oncology

## 2010-09-03 ENCOUNTER — Encounter (HOSPITAL_BASED_OUTPATIENT_CLINIC_OR_DEPARTMENT_OTHER): Payer: Medicare HMO | Admitting: Oncology

## 2010-09-03 DIAGNOSIS — D696 Thrombocytopenia, unspecified: Secondary | ICD-10-CM

## 2010-09-03 DIAGNOSIS — C50219 Malignant neoplasm of upper-inner quadrant of unspecified female breast: Secondary | ICD-10-CM

## 2010-09-03 DIAGNOSIS — C50919 Malignant neoplasm of unspecified site of unspecified female breast: Secondary | ICD-10-CM

## 2010-09-03 DIAGNOSIS — Z5111 Encounter for antineoplastic chemotherapy: Secondary | ICD-10-CM

## 2010-09-03 LAB — CBC WITH DIFFERENTIAL/PLATELET
BASO%: 0.6 % (ref 0.0–2.0)
EOS%: 2.1 % (ref 0.0–7.0)
HCT: 29.1 % — ABNORMAL LOW (ref 34.8–46.6)
MCH: 33.1 pg (ref 25.1–34.0)
MCHC: 34.8 g/dL (ref 31.5–36.0)
MONO#: 0.4 10*3/uL (ref 0.1–0.9)
RBC: 3.06 10*6/uL — ABNORMAL LOW (ref 3.70–5.45)
RDW: 18.1 % — ABNORMAL HIGH (ref 11.2–14.5)
WBC: 3.6 10*3/uL — ABNORMAL LOW (ref 3.9–10.3)
lymph#: 1 10*3/uL (ref 0.9–3.3)

## 2010-09-03 LAB — COMPREHENSIVE METABOLIC PANEL
ALT: 17 U/L (ref 0–35)
AST: 21 U/L (ref 0–37)
Albumin: 4.2 g/dL (ref 3.5–5.2)
CO2: 26 mEq/L (ref 19–32)
Calcium: 9.5 mg/dL (ref 8.4–10.5)
Chloride: 102 mEq/L (ref 96–112)
Creatinine, Ser: 0.78 mg/dL (ref 0.40–1.20)
Potassium: 3.9 mEq/L (ref 3.5–5.3)
Sodium: 139 mEq/L (ref 135–145)
Total Protein: 6 g/dL (ref 6.0–8.3)

## 2010-09-04 ENCOUNTER — Encounter (HOSPITAL_BASED_OUTPATIENT_CLINIC_OR_DEPARTMENT_OTHER): Payer: Medicare HMO | Admitting: Oncology

## 2010-09-04 DIAGNOSIS — Z5189 Encounter for other specified aftercare: Secondary | ICD-10-CM

## 2010-09-04 DIAGNOSIS — C50219 Malignant neoplasm of upper-inner quadrant of unspecified female breast: Secondary | ICD-10-CM

## 2010-09-05 ENCOUNTER — Encounter (HOSPITAL_BASED_OUTPATIENT_CLINIC_OR_DEPARTMENT_OTHER): Payer: Medicare HMO | Admitting: Oncology

## 2010-09-05 DIAGNOSIS — C50219 Malignant neoplasm of upper-inner quadrant of unspecified female breast: Secondary | ICD-10-CM

## 2010-09-05 DIAGNOSIS — Z5189 Encounter for other specified aftercare: Secondary | ICD-10-CM

## 2010-09-06 ENCOUNTER — Encounter (HOSPITAL_BASED_OUTPATIENT_CLINIC_OR_DEPARTMENT_OTHER): Payer: Medicare HMO | Admitting: Oncology

## 2010-09-06 DIAGNOSIS — C50219 Malignant neoplasm of upper-inner quadrant of unspecified female breast: Secondary | ICD-10-CM

## 2010-09-06 DIAGNOSIS — Z5189 Encounter for other specified aftercare: Secondary | ICD-10-CM

## 2010-09-13 ENCOUNTER — Encounter (HOSPITAL_BASED_OUTPATIENT_CLINIC_OR_DEPARTMENT_OTHER): Payer: Medicare HMO | Admitting: Oncology

## 2010-09-13 ENCOUNTER — Other Ambulatory Visit: Payer: Self-pay | Admitting: Oncology

## 2010-09-13 DIAGNOSIS — D696 Thrombocytopenia, unspecified: Secondary | ICD-10-CM

## 2010-09-13 DIAGNOSIS — Z5111 Encounter for antineoplastic chemotherapy: Secondary | ICD-10-CM

## 2010-09-13 DIAGNOSIS — J329 Chronic sinusitis, unspecified: Secondary | ICD-10-CM

## 2010-09-13 DIAGNOSIS — C50919 Malignant neoplasm of unspecified site of unspecified female breast: Secondary | ICD-10-CM

## 2010-09-13 DIAGNOSIS — C50219 Malignant neoplasm of upper-inner quadrant of unspecified female breast: Secondary | ICD-10-CM

## 2010-09-13 LAB — CBC WITH DIFFERENTIAL/PLATELET
BASO%: 1 % (ref 0.0–2.0)
EOS%: 1.2 % (ref 0.0–7.0)
HCT: 29.9 % — ABNORMAL LOW (ref 34.8–46.6)
MCH: 33 pg (ref 25.1–34.0)
MCHC: 34.5 g/dL (ref 31.5–36.0)
MONO#: 0.4 10*3/uL (ref 0.1–0.9)
NEUT%: 64.3 % (ref 38.4–76.8)
RBC: 3.14 10*6/uL — ABNORMAL LOW (ref 3.70–5.45)
WBC: 3.4 10*3/uL — ABNORMAL LOW (ref 3.9–10.3)
lymph#: 0.7 10*3/uL — ABNORMAL LOW (ref 0.9–3.3)

## 2010-09-13 LAB — BASIC METABOLIC PANEL
Calcium: 10 mg/dL (ref 8.4–10.5)
Chloride: 101 mEq/L (ref 96–112)
Creatinine, Ser: 0.72 mg/dL (ref 0.40–1.20)
Sodium: 140 mEq/L (ref 135–145)

## 2010-09-20 ENCOUNTER — Encounter (HOSPITAL_BASED_OUTPATIENT_CLINIC_OR_DEPARTMENT_OTHER): Payer: Medicare HMO | Admitting: Oncology

## 2010-09-20 ENCOUNTER — Other Ambulatory Visit: Payer: Self-pay | Admitting: Oncology

## 2010-09-20 DIAGNOSIS — Z5111 Encounter for antineoplastic chemotherapy: Secondary | ICD-10-CM

## 2010-09-20 DIAGNOSIS — D696 Thrombocytopenia, unspecified: Secondary | ICD-10-CM

## 2010-09-20 DIAGNOSIS — C50919 Malignant neoplasm of unspecified site of unspecified female breast: Secondary | ICD-10-CM

## 2010-09-20 DIAGNOSIS — C50219 Malignant neoplasm of upper-inner quadrant of unspecified female breast: Secondary | ICD-10-CM

## 2010-09-20 LAB — COMPREHENSIVE METABOLIC PANEL
Albumin: 4.4 g/dL (ref 3.5–5.2)
CO2: 27 mEq/L (ref 19–32)
Calcium: 9.3 mg/dL (ref 8.4–10.5)
Chloride: 105 mEq/L (ref 96–112)
Glucose, Bld: 94 mg/dL (ref 70–99)
Potassium: 3.8 mEq/L (ref 3.5–5.3)
Sodium: 143 mEq/L (ref 135–145)
Total Protein: 6.3 g/dL (ref 6.0–8.3)

## 2010-09-20 LAB — CBC WITH DIFFERENTIAL/PLATELET
Basophils Absolute: 0.1 10*3/uL (ref 0.0–0.1)
Eosinophils Absolute: 0.1 10*3/uL (ref 0.0–0.5)
HCT: 31.5 % — ABNORMAL LOW (ref 34.8–46.6)
HGB: 10.3 g/dL — ABNORMAL LOW (ref 11.6–15.9)
NEUT#: 2.1 10*3/uL (ref 1.5–6.5)
NEUT%: 64.2 % (ref 38.4–76.8)
RDW: 15.7 % — ABNORMAL HIGH (ref 11.2–14.5)
lymph#: 0.8 10*3/uL — ABNORMAL LOW (ref 0.9–3.3)

## 2010-09-26 ENCOUNTER — Encounter (INDEPENDENT_AMBULATORY_CARE_PROVIDER_SITE_OTHER): Payer: Self-pay | Admitting: General Surgery

## 2010-10-02 NOTE — Assessment & Plan Note (Signed)
[facility]                         GASTROENTEROLOGY OFFICE NOTE   Rebekah, Moreno                    MRN:          [id number]  DATE:11/24/2006                            DOB:          March 12, 1946    REFERRING PHYSICIAN:  Tera Mater. Clent Ridges, MD   PRIORITY DICTATION   REASON FOR REFERRAL:  Dysphagia.   HISTORY OF PRESENT ILLNESS:  Rebekah Moreno is a 65 year old white female  that I have evaluated in the past.  She has a history of adenomatous  colon polyps and last underwent colonoscopy in March 2005.  She has had  chronic problems with gastroesophageal reflux disease and is maintained  on Nexium.  She takes over-the-counter Zantac on as needed basis for  breakthrough symptoms.  She had an episode of nocturnal reflux  associated with significant throat burning a few weeks ago.  She has had  dysphagia intermittently for over six months.  She notes her symptoms  mainly with meat.  She has noted occasional small amounts of bright red  blood when straining with bowel movements.  She has internal and  external hemorrhoids.  She relates that her mother had some form of  abdominal cancer in her late 77s, and she thinks it might have been  colon cancer but she is not sure.  The patient has had no weight loss,  odynophagia, nausea or vomiting.  She underwent prior endoscopy with  dilation in 2002 per her report but we are unable to locate the record  of this procedure today.  Unfortunately, her chart is not available  today either.  Recent blood work by Dr. Clent Ridges showed a hemoglobin of 11.5  with a  normal MCV at 84.   PAST MEDICAL HISTORY:  1. Hypertension.  2. Hyperlipidemia.  3. Anxiety.  4. Gastroesophageal reflux disease.  5. Peptic stricture.  6. Adenomatous colon polyps.  7. Hemorrhoids, status post remote hemorrhoidectomy.  8. Status post back surgery x2.  9. Status post left total knee replacement 2001.   CURRENT MEDICATIONS:  Listed on her  chart updated and reviewed.   MEDICATION ALLERGIES:  None known.   SOCIAL HISTORY AND REVIEW OF SYSTEMS:  Per the hand-written form.   PHYSICAL EXAMINATION:  Well-developed, overweight white female in no  acute distress.  Height five feet seven inches, weight 196.4 pounds,  blood pressure 116/68, pulse 76 and regular.  HEENT:  Anicteric sclerae.  Oropharynx clear.  Chest:  Clear to  auscultation bilaterally.  Cardiac:  Regular rate and rhythm, without  murmurs appreciated.  Abdomen:  Soft, nontender, nondistended.  Normoactive bowel sounds.  No palpable organomegaly, masses or hernias.  Extremities:  No clubbing, cyanosis or edema.  Neurological:  Alert and  oriented x3.  Grossly nonfocal.   ASSESSMENT AND PLAN:  1. Gastroesophageal reflux disease and solid food dysphagia.  Re-      intensify all anti-reflux measures.  She was given written      instructions.  Continue Nexium 40 mg orally every morning.  Rule      out peptic strictures, esophagitis and less likely esophageal  neoplasms.  Risks, benefits and alternative to upper endoscopy with      possible biopsy and possible dilation discussed with the patient.      She consents to proceed.  This will be scheduled electively.  2. Small-volume hematochezia likely secondary to known hemorrhoids.  3. Normocytic anemia.  Further evaluation with endoscopy as above.      Dr. Clent Ridges to further evaluate her anemia.  4. Personal history of adenomatous colon polyps.  Recall colonoscopy      recommended for March 2010.     [name]. Russella Dar, MD, [facility]  Electronically Signed    MTS/MedQ  DD: 11/24/2006  DT: 11/24/2006  Job #: [id number]   cc:   Jeannett Senior A. Clent Ridges, MD

## 2010-10-02 NOTE — Op Note (Signed)
Rebekah Moreno, EAKER             ACCOUNT NO.:  [id number]   MEDICAL RECORD NO.:  [id number]          PATIENT TYPE:  AMB   LOCATION:  SDC                           FACILITY:  [facility]   PHYSICIAN:  Duke Salvia. Marcelle Overlie, M.D.DATE OF BIRTH:  1946-04-07   DATE OF PROCEDURE:  03/26/2007  DATE OF DISCHARGE:                               OPERATIVE REPORT   PREOPERATIVE DIAGNOSIS:  Chronically inflamed vulvar cyst x2.   POSTOPERATIVE DIAGNOSIS:  Chronically inflamed vulvar cyst x2.   PROCEDURE:  Excision of 2 chronically inflamed vulvar cysts.   SURGEON:  Duke Salvia. Marcelle Overlie, M.D.   ANESTHESIA:  Local plus sedation.   ESTIMATED BLOOD LOSS:  Minimal.   SPECIMENS REMOVED:  Two vulvar cysts.   PROCEDURE AND FINDINGS:  The patient was taken to the operating room  after an adequate level of sedation was obtained, the patient legs in  stirrups.  The perineum was prepped and draped with Betadine.  The two  areas had been previously marked preoperatively.  These areas, one was  at the upper juncture of the leg and labial area; the other was inferior  to that at the left inner thigh.  These areas were infiltrated with half  percent Marcaine plain, excised and the ellipsed, carried down to the  subcutaneous layer, making sure that the entire indurated cyst was  excised.  Bovie used for cautery.  The deeper space was closed with 3-0  Vicryl Rapide interrupted sutures and 3-0 Vicryl Rapide interrupted  sutures used on the skin with excellent hemostasis in both areas.  She  tolerated this well, went to recovery room in good condition.      Richard M. Marcelle Overlie, M.D.  Electronically Signed     RMH/MEDQ  D:  03/26/2007  T:  03/26/2007  Job:  [id number]

## 2010-10-02 NOTE — H&P (Signed)
Rebekah Moreno, Rebekah Moreno             ACCOUNT NO.:  [id number]   MEDICAL RECORD NO.:  [id number]          PATIENT TYPE:  AMB   LOCATION:  SDC                           FACILITY:  [facility]   PHYSICIAN:  Duke Salvia. Marcelle Overlie, M.D.DATE OF BIRTH:  1946/03/17   DATE OF ADMISSION:  DATE OF DISCHARGE:                              HISTORY & PHYSICAL   CHIEF COMPLAINT:  Chronically inflamed vulvar sebaceous cyst.   HISTORY OF PRESENT ILLNESS:  This is a 65 year old postmenopausal G1,  P1. The patient has a chronically inflamed vulvar sebaceous cyst that is  not improved over time with several trials of antibiotics.  This  continues to be bothersome for her.  She presents now for excision under  local and sedation.   PAST MEDICAL HISTORY:   ALLERGIES:  None.   OPERATIONS:  Knee surgery.   REVIEW OF SYSTEMS:  Significant for osteoporosis and osteopenia, chronic  hypertension.   CURRENT MEDICATIONS:  1. Celebrex p.r.n.  2. Nexium.  3. Blood pressure medication.  4. Zocor.  5. Zoloft.   PHYSICAL EXAMINATION:  VITAL SIGNS:  Temperature 98.2, blood pressure  120/84.  HEENT:  Unremarkable.  NECK:  Supple without masses.  LUNGS:  Clear.  CARDIOVASCULAR:  Regular rate and rhythm without murmurs, rubs, or  gallops noted.  BREASTS:  Without masses.  ABDOMEN:  Soft, flat and nontender.  PELVIC:  Normal external genitalia.  Vagina and cervix clear.  Uterus  mid-position, normal size, adnexa negative.  There is a small,  chronically inflamed sebaceous cyst in the area of the left mons.  EXTREMITIES:  Unremarkable.  NEUROLOGIC:  Unremarkable.   IMPRESSION:  Chronically inflamed vulvar sebaceous cyst.   PLAN:  Excision under local plus sedation.  Procedure and risks were  reviewed with the patient.      Richard M. Marcelle Overlie, M.D.  Electronically Signed     RMH/MEDQ  D:  03/25/2007  T:  03/25/2007  Job:  [id number]

## 2010-10-04 ENCOUNTER — Encounter (HOSPITAL_BASED_OUTPATIENT_CLINIC_OR_DEPARTMENT_OTHER): Payer: Medicare HMO | Admitting: Oncology

## 2010-10-04 ENCOUNTER — Other Ambulatory Visit: Payer: Self-pay | Admitting: Oncology

## 2010-10-04 ENCOUNTER — Ambulatory Visit (HOSPITAL_BASED_OUTPATIENT_CLINIC_OR_DEPARTMENT_OTHER): Admission: RE | Admit: 2010-10-04 | Payer: Self-pay | Source: Ambulatory Visit | Admitting: Plastic Surgery

## 2010-10-04 DIAGNOSIS — C50219 Malignant neoplasm of upper-inner quadrant of unspecified female breast: Secondary | ICD-10-CM

## 2010-10-04 DIAGNOSIS — J329 Chronic sinusitis, unspecified: Secondary | ICD-10-CM

## 2010-10-04 LAB — CBC WITH DIFFERENTIAL/PLATELET
BASO%: 0.9 % (ref 0.0–2.0)
Basophils Absolute: 0 10*3/uL (ref 0.0–0.1)
EOS%: 2.7 % (ref 0.0–7.0)
HCT: 33.3 % — ABNORMAL LOW (ref 34.8–46.6)
HGB: 11 g/dL — ABNORMAL LOW (ref 11.6–15.9)
LYMPH%: 26.7 % (ref 14.0–49.7)
MCH: 31 pg (ref 25.1–34.0)
MCHC: 33 g/dL (ref 31.5–36.0)
MCV: 93.8 fL (ref 79.5–101.0)
NEUT%: 55.9 % (ref 38.4–76.8)
Platelets: 152 10*3/uL (ref 145–400)

## 2010-10-04 LAB — BASIC METABOLIC PANEL
BUN: 14 mg/dL (ref 6–23)
Creatinine, Ser: 0.76 mg/dL (ref 0.40–1.20)
Glucose, Bld: 105 mg/dL — ABNORMAL HIGH (ref 70–99)
Potassium: 3.8 mEq/L (ref 3.5–5.3)

## 2010-10-05 NOTE — Discharge Summary (Signed)
NAME:  Rebekah Moreno, Rebekah Moreno                       ACCOUNT NO.:  [id number]   MEDICAL RECORD NO.:  [id number]                   PATIENT TYPE:  INP   LOCATION:  0376                                 FACILITY:  [facility]   PHYSICIAN:  Jene Every, M.D.                 DATE OF BIRTH:  April 18, 1946   DATE OF ADMISSION:  04/26/2002  DATE OF DISCHARGE:  05/01/2002                                 DISCHARGE SUMMARY   ADMISSION DIAGNOSES:  1. End-stage osteoarthritis of the left knee.  2. Hypertension.  3. Gastroesophageal reflux disease.  4. Osteoarthritis.   DISCHARGE DIAGNOSES:  1. End-stage osteoarthritis of the left knee, status post left total knee     arthroplasty.  2. Resolving postoperative anemia, hypernatremia, and hypokalemia.  3. Resolved hypoxia.  4. Hypertension.  5. Gastroesophageal reflux disease.  6. Osteoarthritis.   OPERATIONS/PROCEDURES:  The patient underwent a left total knee arthroplasty  at [facility] by Dr. Jene Every.  Assistant:  Roma Schanz, P.A.C.  Anesthesia:  General.  Complications:  None.  Tourniquet  time was two hours.   CONSULTATIONS:  1. PT.  2. OT.  3. Hospitalist.   BRIEF HISTORY:  The patient is a 65 year old female who has a longstanding  history of left knee pain.  The patient has failed conservative therapies  including corticosteroid injections, Synvisc injections.  She states the  pain in her left knee is disabling.  There is instability noted on exam.  Radiographs of her knee show end-stage medial compartment arthrosis with  mild arthrosis laterally and patellofemoral arthrosis.  The risks and  benefits were discussed with the patient, and it was felt that surgical  intervention was indicated.  Subsequently, she was admitted to [facility] to undergo a total knee arthroplasty by Dr. Jene Every.   LABORATORY DATA:  On admission hemoglobin was 13.1, hematocrit 38.2.  Serial  hemoglobins and hematocrits were  followed throughout the patient's hospital  course.  The patient did fall to a level of 8.1 hemoglobin, hematocrit 23.5.  Subsequently, she received a blood transfusion.  At the time of discharge  her hemoglobin was 10.3, hematocrit of 29.8.  Other differential was all  within normal limits.  PT/INR at the time of admission:  PT was 12.5, INR  was 0.9.  Serial PT/INRs were followed throughout the hospital course.  At  the time of discharge the patient was therapeutic with a PT of 20.6 and INR  of 2.0.  Chemistries at the time of admission were within normal limits.  There were no abnormalities noted.  Throughout the hospital course the  patient did become slightly hyponatremic, falling to a sodium of 130.  She  also had some mild hypokalemia with a potassium of 3.3.  Serial BMETs were  followed throughout the hospital course.  At the time of discharge the  patient's sodium was 136, potassium 4.1.  All  other values were within  normal limits.  The patient's urinalysis at the time of admission was  negative.  The patient's blood type is O positive.  Throughout the patient's  hospital course she had an episode of hypoxia where additional laboratory  values were ordered.  Stat blood gases were ordered on April 28, 2002.  She had a pH of 7.457, a pCO2 of 46.3, pO2 of 67.6, bicarbonate of 31.8.  Also ordered were cardiac enzymes.  CK was 58, CK-MB 2.2, troponin 0.17.   EKG at the time of admission showed normal sinus rhythm.  EKG done during  hospital course showed no changes from admission.   Preoperative chest x-ray showed chronic changes with no acute disease.  X-  rays of the left knee revealed advanced degenerative changes with  chondrocalcinosis and probable joint effusion.  Chest x-ray portable done  during hospital course showed bilateral basilar atelectasis.  She has a  history of significant chronic bronchitis and chronic obstructive pulmonary  disease.  No definite consolidation  or pleural effusion or pneumothorax was  noted.  The patient also had CT of her chest which showed no evidence for  pulmonary embolism.  It did show linear atelectasis at both bases.  Postoperative x-rays of the left knee showed good position and alignment  following left total knee replacement.   HOSPITAL COURSE:  The patient underwent the above-stated procedures without  complications.  She was taken to the PACU and then returned to the  orthopedic floor for continued postoperative care.  Hemovac drain was placed  at the time of surgery.  The patient was placed on PCA medication for pain  management.  Coumadin was started for DVT prophylaxis.  The patient did have  significant output from her Hemovac drain following surgery.  The Hemovac  drain was clamped, and the output was monitored.  Also, hemoglobin and  hematocrit were checked.  On postoperative day #1 hemoglobin was 9.3,  hematocrit was 26.6.  This was reevaluated at noontime and remained the  same.  On postoperative day #2 the patient had significant reflux-type  symptoms.  She did note some mild shortness of breath.  Hemoglobin was 8.1,  hematocrit 23.5.  The patient was therapeutic on her Coumadin with an INR of  2.0.  An O2 saturation was obtained, was 75% on room air; on 2 L it was 91%;  on 4 L it was 92%.  It was of concern at this time that the patient may have  been developing a pulmonary embolism.  Stat EKG and chest x-rays were  ordered.  Once the patient was stabilized, a CT to rule out pulmonary  embolism was ordered.  We consulted the hospitalist to evaluate the patient  for her hypoxic event.  Laboratory values showed the patient was  hyponatremic at 130.  The patient did note that when she sat upright this  pain was relieved in her chest area.  Also of concern at this point was  overmedication due to the fact that the patient was on the PCA and also receiving p.o. Percocet.  The patient was transfused with two units  of  packed red blood cells and transferred to a stepdown unit.  Coumadin was  held on postoperative day #2 due to hypoxic event.  On postoperative day #3  the patient was doing much better.  She had stabilized and had no other  significant hypoxic events.  Her O2 saturations were at 96%.  Hemoglobin was  9.9, hematocrit  28.5.  The patient remained hyponatremic and hypokalemic.  IV was changed to Hep-Lock.  We discontinued her Foley and discontinued PCA,  and the patient was begun on her CPM machine.  She was given 40 mEq of KCl  for her hypokalemia.  The patient began her physical therapy and was  enacting well with this on postoperative day #3.  Throughout the rest of the  patient's hospital course the hospitalist managed her hypoxia and other  medical-related events concerning her COPD and development of atelectasis.  On postoperative day #3 the patient's Coumadin was restarted.  On  postoperative day #4 the patient was doing extremely well.  Pain was well  controlled on p.o. medications.  She had no further chest pain or shortness  of breath.  CPM was at -3 to 60 degrees.  Hemoglobin was 10.3, hematocrit  28.9.  Coumadin was therapeutic at 2.1.  The plan was for the patient to be  discharged following PT, OT.  The patient did remain until postoperative day  #5 where she was doing extremely well, and it was felt at this time that she  could be discharged home.   CONDITION ON DISCHARGE:  Improved.   DISCHARGE PLANS:  The patient was to follow up with Dr. Shelle Iron in 10-14 days  and call for an appointment.  She is to keep her incision clean and dry.  She may shower and change the dressing on an every-daily basis.   DISCHARGE MEDICATIONS:  1. Percocet 5/325 p.r.n. pain.  2. Robaxin 500 mg p.r.n. spasm.  3. Trinsicon 1 p.o. t.i.d. x 3 weeks.  4. Vitamin C 500 mg 1 p.o. q.d.  5. Coumadin was dosed per the pharmacy.   ACTIVITY:  Weightbearing as tolerated to the left lower extremity.  A  knee  immobilizer to left knee until the patient can do quad raises.   DIET:  Regular.     Roma Schanz, P.A.                   Jene Every, M.D.    CS/MEDQ  D:  05/14/2002  T:  05/14/2002  Job:  [id number]

## 2010-10-26 ENCOUNTER — Other Ambulatory Visit: Payer: Self-pay | Admitting: Family Medicine

## 2010-10-29 ENCOUNTER — Telehealth: Payer: Self-pay | Admitting: *Deleted

## 2010-10-29 ENCOUNTER — Encounter: Payer: Self-pay | Admitting: Internal Medicine

## 2010-10-29 ENCOUNTER — Ambulatory Visit (INDEPENDENT_AMBULATORY_CARE_PROVIDER_SITE_OTHER): Payer: Medicare HMO | Admitting: Internal Medicine

## 2010-10-29 ENCOUNTER — Ambulatory Visit (INDEPENDENT_AMBULATORY_CARE_PROVIDER_SITE_OTHER)
Admission: RE | Admit: 2010-10-29 | Discharge: 2010-10-29 | Disposition: A | Discharge status: Home / Self Care | Payer: Medicare HMO | Source: Ambulatory Visit | Attending: Internal Medicine | Admitting: Internal Medicine

## 2010-10-29 VITALS — BP 110/74 | HR 76 | Temp 98.2°F | Resp 14 | Ht 67.0 in | Wt 194.0 lb

## 2010-10-29 DIAGNOSIS — R079 Chest pain, unspecified: Secondary | ICD-10-CM

## 2010-10-29 DIAGNOSIS — R0602 Shortness of breath: Secondary | ICD-10-CM

## 2010-10-29 LAB — CBC WITH DIFFERENTIAL/PLATELET
Basophils Relative: 0.5 % (ref 0.0–3.0)
Eosinophils Relative: 3.5 % (ref 0.0–5.0)
Lymphocytes Relative: 22.6 % (ref 12.0–46.0)
MCV: 91.4 fl (ref 78.0–100.0)
Neutrophils Relative %: 67.1 % (ref 43.0–77.0)
RBC: 3.68 Mil/uL — ABNORMAL LOW (ref 3.87–5.11)
WBC: 4.9 10*3/uL (ref 4.5–10.5)

## 2010-10-29 NOTE — Progress Notes (Signed)
Subjective:    Patient ID: Rebekah Moreno, female    DOB: [birth year], 65 y.o.   MRN: [id number]  HPI The patient has experienced a 2 to three-week history of chest pressure and shortness of breath.  She is an expander in her left breast for planned reconstruction following mastectomy for breast cancer. Her mastectomy was in November of 2011.  She had chemotherapy but no radiation therapy. He is not on any adjuvant therapy. The patient had shortness of breath with routine activities that she normally could do She never smoked There is a family history of heart disease in her father's side The patient is on Zocor for hyperlipidemia she has hypertension Service factors are advanced age, hyper lipidemia and hypertension. With 3 risk factors and planned surgery I believe we should treat this more seriously   Review of Systems  Constitutional: Negative for activity change, appetite change and fatigue.  HENT: Negative for ear pain, congestion, neck pain, postnasal drip and sinus pressure.   Eyes: Negative for redness and visual disturbance.  Respiratory: Negative for cough, shortness of breath and wheezing.   Gastrointestinal: Negative for abdominal pain and abdominal distention.  Genitourinary: Negative for dysuria, frequency and menstrual problem.  Musculoskeletal: Negative for myalgias, joint swelling and arthralgias.  Skin: Negative for rash and wound.  Neurological: Negative for dizziness, weakness and headaches.  Hematological: Negative for adenopathy. Does not bruise/bleed easily.  Psychiatric/Behavioral: Negative for sleep disturbance and decreased concentration.   Past Medical History  Diagnosis Date  . Hypertension   . Hyperlipidemia   . GERD (gastroesophageal reflux disease)   . Asthma   . Arthritis   . Cancer     Breast  . Colon polyps   . Compression fx, lumbar spine     l2  . Osteoporosis   . Hematuria   . Anxiety   . Peptic stricture of esophagus   .  Hemorrhoids    Past Surgical History  Procedure Date  . Knee arthroscopy     left  . Joint replacement     left knee  . Left knee arthroscopy  02/2001  . Total knee arthroplasty left 12/2--3    dr s Livingston Diones dean  . L3,l4 discectomy 11/2000    drkritzer  . Rhinoplasty     dr Jearld Fenton  . Hemorrhoid surgery     reports that she has never smoked. She does not have any smokeless tobacco history on file. She reports that she drinks about .5 ounces of alcohol per week. She reports that she does not use illicit drugs. family history includes Arthritis in an unspecified family member; Cancer in an unspecified family member; Heart disease in her paternal grandmother and unspecified family member; Hypertension in an unspecified family member; and Stroke in an unspecified family member. Allergies not on file     Objective:   Physical Exam  Constitutional: She is oriented to person, place, and time. She appears well-developed and well-nourished. No distress.  HENT:  Head: Normocephalic and atraumatic.  Right Ear: External ear normal.  Left Ear: External ear normal.  Nose: Nose normal.  Mouth/Throat: Oropharynx is clear and moist.  Eyes: Conjunctivae and EOM are normal. Pupils are equal, round, and reactive to light.  Neck: Normal range of motion. Neck supple. No JVD present. No tracheal deviation present. No thyromegaly present.  Cardiovascular: Normal rate, regular rhythm, normal heart sounds and intact distal pulses.   No murmur heard. Pulmonary/Chest: Effort normal and breath sounds normal. She has no wheezes.  She exhibits no tenderness.  Abdominal: Soft. Bowel sounds are normal.  Musculoskeletal: Normal range of motion. She exhibits no edema and no tenderness.  Lymphadenopathy:    She has no cervical adenopathy.  Neurological: She is alert and oriented to person, place, and time. She has normal reflexes. No cranial nerve deficit.  Skin: Skin is warm and dry. She is not diaphoretic.    Psychiatric: She has a normal mood and affect. Her behavior is normal.          Assessment & Plan:  Due to her risk factors and her symptoms we recommend that she proceed with a stress Cardiolite prior to her surgery.  Her EKG today does not reveal any acute ischemia but she has significant risks of age hypertension and hyperlipidemia.  Her blood pressure is well-controlled and I would not add any medications today other than to make sure she starts a baby aspirin every day. Appropriate lab work today will include a d-dimer to make sure that the shortness of breath does not represent any clotting events.  She has had no swelling in her lower extremity to indicate a DVT.   Chest x-ray will be obtained today

## 2010-10-29 NOTE — Telephone Encounter (Signed)
Call from Dr Abbey Chatters. Pt mentioned some recent exertional chest pain.  I have suggested that she be worked in to further assess.

## 2010-10-30 ENCOUNTER — Other Ambulatory Visit: Payer: Self-pay | Admitting: Family Medicine

## 2010-10-30 ENCOUNTER — Telehealth: Payer: Self-pay | Admitting: *Deleted

## 2010-10-30 NOTE — Telephone Encounter (Signed)
Pt is asking for lab and xray results and if stress test was scheduled??

## 2010-10-30 NOTE — Telephone Encounter (Signed)
Talked with pt and gave xray results- needs dr Lovell Sheehan to look at labs before giving results

## 2010-10-31 ENCOUNTER — Telehealth: Payer: Self-pay | Admitting: *Deleted

## 2010-10-31 NOTE — Telephone Encounter (Signed)
PER DR Lovell Sheehan- ORDER CT ANGIOGRAM PE PROTOCOL DUE TO ELEVATED D-DIMER

## 2010-11-01 ENCOUNTER — Encounter: Payer: Self-pay | Admitting: *Deleted

## 2010-11-01 ENCOUNTER — Ambulatory Visit (INDEPENDENT_AMBULATORY_CARE_PROVIDER_SITE_OTHER)
Admission: RE | Admit: 2010-11-01 | Discharge: 2010-11-01 | Disposition: A | Discharge status: Home / Self Care | Payer: Medicare HMO | Source: Ambulatory Visit | Attending: Internal Medicine | Admitting: Internal Medicine

## 2010-11-01 DIAGNOSIS — R079 Chest pain, unspecified: Secondary | ICD-10-CM

## 2010-11-01 MED ORDER — IOHEXOL 300 MG/ML  SOLN
80.0000 mL | Freq: Once | INTRAMUSCULAR | Status: AC | PRN
  Administered 2010-11-01: 80 mL via INTRAVENOUS

## 2010-11-06 ENCOUNTER — Ambulatory Visit (HOSPITAL_COMMUNITY): Payer: Medicare HMO | Attending: Family Medicine | Admitting: Radiology

## 2010-11-06 DIAGNOSIS — I4949 Other premature depolarization: Secondary | ICD-10-CM

## 2010-11-06 DIAGNOSIS — R0602 Shortness of breath: Secondary | ICD-10-CM

## 2010-11-06 DIAGNOSIS — I491 Atrial premature depolarization: Secondary | ICD-10-CM

## 2010-11-06 DIAGNOSIS — R0609 Other forms of dyspnea: Secondary | ICD-10-CM

## 2010-11-06 DIAGNOSIS — R0789 Other chest pain: Secondary | ICD-10-CM

## 2010-11-06 DIAGNOSIS — R0989 Other specified symptoms and signs involving the circulatory and respiratory systems: Secondary | ICD-10-CM

## 2010-11-06 DIAGNOSIS — R079 Chest pain, unspecified: Secondary | ICD-10-CM | POA: Insufficient documentation

## 2010-11-06 MED ORDER — TECHNETIUM TC 99M TETROFOSMIN IV KIT
11.0000 | PACK | Freq: Once | INTRAVENOUS | Status: AC | PRN
  Administered 2010-11-06: 11 via INTRAVENOUS

## 2010-11-06 MED ORDER — TECHNETIUM TC 99M TETROFOSMIN IV KIT
33.0000 | PACK | Freq: Once | INTRAVENOUS | Status: AC | PRN
  Administered 2010-11-06: 33 via INTRAVENOUS

## 2010-11-06 NOTE — Progress Notes (Signed)
[facility] SITE 3 NUCLEAR MED [address] [place] [address] [phone]  Cardiology Nuclear Med Study  Rebekah Moreno is a 66 y.o. female [id number] 01-18-1946   Nuclear Med Background Indication for Stress Test:  Evaluation for Ischemia and Pending Surgical Clearance with Dr. Etter Sjogren for reconstructive breast surgery History:  '99 GXT:OK per patient, H/O Chemo. Cardiac Risk Factors: Family History - CAD, Hypertension, Lipids and Overweight  Symptoms:  Chest Pain/Pressure with and without Exertion (last episode of chest discomfort was about 2-weeks ago), Diaphoresis, DOE and Fatigue   Nuclear Pre-Procedure Caffeine/Decaff Intake:  None NPO After: 8:00am   Lungs:  Clear.  O2 Sat 95% on RA. IV 0.9% NS with Angio Cath:  20g  IV Site: R Forearm  IV Started by:  Stanton Kidney, EMT-P  Chest Size (in):  38 Cup Size: C  Height: 5\' 7"  (1.702 m)  Weight:  194 lb (87.998 kg)  BMI:  Body mass index is 30.38 kg/(m^2). Tech Comments:  A.M. Meds. Taken    Nuclear Med Study 1 or 2 day study: 1 day  Stress Test Type:  Stress  Reading MD: Willa Rough, MD  Order Authorizing Provider:  Darryll Capers, MD  Resting Radionuclide: Technetium 19m Tetrofosmin  Resting Radionuclide Dose: 11 mCi   Stress Radionuclide:  Technetium 34m Tetrofosmin  Stress Radionuclide Dose: 33 mCi           Stress Protocol Rest HR: 63 Stress HR: 151  Rest BP: 131/83 Stress BP: 204/68  Exercise Time (min): 7:00 METS: 8.5   Predicted Max HR: 155 bpm % Max HR: 97.42 bpm Rate Pressure Product: 21308   Dose of Adenosine (mg):  n/a Dose of Lexiscan: n/a mg  Dose of Atropine (mg): n/a Dose of Dobutamine: n/a mcg/kg/min (at max HR)  Stress Test Technologist: Smiley Houseman, CMA-N  Nuclear Technologist:  Doyne Keel, CNMT     Rest Procedure:  Myocardial perfusion imaging was performed at rest 45 minutes following the intravenous administration of Technetium 86m Tetrofosmin.  Rest ECG: No  acute changes.  Stress Procedure:  The patient exercised for seven minutes on the treadmill utilizing the Bruce protocol.  The patient stopped due to fatigue and denied any chest pain.  There were no diagnostic ST-T wave changes, only occasional PAC's and rare PVC's.  She had a mild hypertensive response, 204/68.  Technetium 64m Tetrofosmin was injected at peak exercise and myocardial perfusion imaging was performed after a brief delay.  Stress ECG: No significant change from baseline ECG  QPS Raw Data Images:  Normal; no motion artifact; normal heart/lung ratio. Stress Images:  Anterior breast attenuation Rest Images:  Same as stress Subtraction (SDS):  No evidence of ischemia. Transient Ischemic Dilatation (Normal <1.22):  .90 Lung/Heart Ratio (Normal <0.45):  .39  Quantitative Gated Spect Images QGS EDV:  78 ml QGS ESV:  26 ml QGS cine images:  Normal Wall Motion QGS EF: 67%  Impression Exercise Capacity:  Fair exercise capacity. BP Response:  Hypertensive blood pressure response. Clinical Symptoms:  SOB ECG Impression:  No significant ST segment change suggestive of ischemia. Comparison with Prior Nuclear Study: No previous nuclear study performed  Overall Impression:  Normal stress nuclear study.  There is anterior breast attenuation. There is no scar or ischemia.    Willa Rough

## 2010-11-08 ENCOUNTER — Telehealth: Payer: Self-pay | Admitting: *Deleted

## 2010-11-08 NOTE — Telephone Encounter (Signed)
I cannot find any results on this. It would have been sent to Dr. Lovell Sheehan, since he ordered it

## 2010-11-08 NOTE — Telephone Encounter (Signed)
Pt is calling for stress test results 

## 2010-11-09 NOTE — Telephone Encounter (Signed)
It is under frye's name in chart- Dr swords reviewed chart and pt was called

## 2010-11-13 ENCOUNTER — Encounter (HOSPITAL_BASED_OUTPATIENT_CLINIC_OR_DEPARTMENT_OTHER)
Admission: RE | Admit: 2010-11-13 | Discharge: 2010-11-13 | Disposition: A | Discharge status: Home / Self Care | Payer: Medicare HMO | Source: Ambulatory Visit | Attending: Plastic Surgery | Admitting: Plastic Surgery

## 2010-11-13 LAB — BASIC METABOLIC PANEL
GFR calc Af Amer: >60 mL/min (ref >60)
GFR calc non Af Amer: >60 mL/min (ref >60)
Potassium: 4 mEq/L (ref 3.5–5.1)
Sodium: 139 mEq/L (ref 135–145)

## 2010-11-15 ENCOUNTER — Ambulatory Visit (HOSPITAL_BASED_OUTPATIENT_CLINIC_OR_DEPARTMENT_OTHER)
Admission: RE | Admit: 2010-11-15 | Discharge: 2010-11-15 | Disposition: A | Discharge status: Home / Self Care | Payer: Medicare HMO | Source: Ambulatory Visit | Attending: Plastic Surgery | Admitting: Plastic Surgery

## 2010-11-15 DIAGNOSIS — C50919 Malignant neoplasm of unspecified site of unspecified female breast: Secondary | ICD-10-CM | POA: Insufficient documentation

## 2010-11-15 DIAGNOSIS — Z9010 Acquired absence of unspecified breast and nipple: Secondary | ICD-10-CM | POA: Insufficient documentation

## 2010-11-15 DIAGNOSIS — Z01812 Encounter for preprocedural laboratory examination: Secondary | ICD-10-CM | POA: Insufficient documentation

## 2010-11-15 DIAGNOSIS — Z79899 Other long term (current) drug therapy: Secondary | ICD-10-CM | POA: Insufficient documentation

## 2010-11-16 ENCOUNTER — Other Ambulatory Visit: Payer: Self-pay | Admitting: *Deleted

## 2010-11-16 MED ORDER — ALPRAZOLAM 0.25 MG PO TABS: 0.2500 mg | ORAL_TABLET | Freq: Three times a day (TID) | ORAL | Status: DC | PRN

## 2010-11-26 NOTE — Op Note (Signed)
  NAMEURI, COVEY NO.:  [id number]  MEDICAL RECORD NO.:  [id number]  LOCATION:  [id number]                     FACILITY:  [facility]  PHYSICIAN:  Etter Sjogren, M.D.     DATE OF BIRTH:  [birth year]  DATE OF PROCEDURE:  11/15/2010 DATE OF DISCHARGE:  11/06/2010                              OPERATIVE REPORT   PREOPERATIVE DIAGNOSIS:  Left breast cancer.  POSTOPERATIVE DIAGNOSIS:  Left breast cancer.  PROCEDURE:  Removal of tissue expander and placement of silicone gel implant for breast reconstruction on the left side.  SURGEON:  Etter Sjogren, MD  ANESTHESIA:  General.  ESTIMATED BLOOD LOSS:  5 mL.  DRAINS:  One 19-French.  CLINICAL NOTE:  A 65 year old woman who has had left breast cancer mastectomy reconstruction with tissue expander and now presents for removal of expander and placement of implant.  She selected 750 mL volume and first silicone gel was post saline.  The procedure, the risks, possible complications and overall convalescence were discussed with her and risks possible complications were discussed include but not limited to bleeding, infection, anesthesia complications, healing problems, scarring, loss of sensation, fluid accumulations, failure of device, capsular contracture, wrinkles, ripples, displacement of device, pneumothorax, pulmonary embolism, and disappointment asymmetry and she understood all of this and wished to proceed.  DESCRIPTION:  The patient was taken to the operating room and placed supine.  After successful induction of general anesthesia, she was prepped with ChloraPrep and after waiting full 3 minutes for drying, she was draped with sterile drapes.  On the lateral aspect, the old mastectomy scar was utilized.  Dissection was carried down through subcutaneous tissue in the muscle, the underlying tissue expander was identified, deflated and removed.  The space was inspected, excellent condition, thorough irrigation with  saline.  Small release of the capsule superolaterally just for a couple of centimeters and hemostasis with electrocautery.  A 19-French drain positioned, brought through separate stab wound inferolaterally and secured with 3-0 Prolene suture. Thorough irrigation with saline, antibiotic solution was also placed around to dwell in the space and the implant was soaked in antibiotic solution and allowed to dwell there as well.  With thorough cleaned gloves the implant was placed, antibiotic solution was placed in the space prior to positioning the implant and the closure with 3-0 Vicryl interrupted figure-of-eight sutures taking great care to avoid damage of underlying implant which was kept under direct vision at all times. Again, antibiotic solution for the wound, 3-0 Monocryl interrupted inverted deep dermal sutures, and 4-0 Prolene simple interrupted sutures.  Antibiotic ointment, Xeroform gauze, dry sterile dressing.  A chest vest placed and antibiotic ointment and dry sterile dressing around the drains. Transferred to the recovery room stable having tolerated the procedure well.  DISPOSITION:  Follow up in the office next week.     Etter Sjogren, M.D.   ______________________________ Etter Sjogren, M.D.    DB/MEDQ  D:  11/15/2010  T:  11/16/2010  Job:  [id number]  Electronically Signed by Etter Sjogren M.D. on 11/26/2010 08:44:25 AM

## 2010-12-03 ENCOUNTER — Other Ambulatory Visit: Payer: Self-pay | Admitting: Physician Assistant

## 2010-12-03 ENCOUNTER — Encounter (HOSPITAL_BASED_OUTPATIENT_CLINIC_OR_DEPARTMENT_OTHER): Payer: Medicare HMO | Admitting: Oncology

## 2010-12-03 ENCOUNTER — Other Ambulatory Visit: Payer: Self-pay | Admitting: Oncology

## 2010-12-03 DIAGNOSIS — Z5111 Encounter for antineoplastic chemotherapy: Secondary | ICD-10-CM

## 2010-12-03 DIAGNOSIS — Z5189 Encounter for other specified aftercare: Secondary | ICD-10-CM

## 2010-12-03 DIAGNOSIS — Z9010 Acquired absence of unspecified breast and nipple: Secondary | ICD-10-CM

## 2010-12-03 DIAGNOSIS — C50219 Malignant neoplasm of upper-inner quadrant of unspecified female breast: Secondary | ICD-10-CM

## 2010-12-03 DIAGNOSIS — J329 Chronic sinusitis, unspecified: Secondary | ICD-10-CM

## 2010-12-03 LAB — CBC WITH DIFFERENTIAL/PLATELET
Basophils Absolute: 0 10*3/uL (ref 0.0–0.1)
Eosinophils Absolute: 0.2 10*3/uL (ref 0.0–0.5)
HGB: 12.4 g/dL (ref 11.6–15.9)
MCV: 87.4 fL (ref 79.5–101.0)
MONO%: 6.4 % (ref 0.0–14.0)
NEUT#: 2 10*3/uL (ref 1.5–6.5)
Platelets: 158 10*3/uL (ref 145–400)
RDW: 14 % (ref 11.2–14.5)

## 2010-12-03 LAB — COMPREHENSIVE METABOLIC PANEL
Albumin: 4 g/dL (ref 3.5–5.2)
Alkaline Phosphatase: 99 U/L (ref 39–117)
BUN: 19 mg/dL (ref 6–23)
Calcium: 9.1 mg/dL (ref 8.4–10.5)
Glucose, Bld: 143 mg/dL — ABNORMAL HIGH (ref 70–99)
Potassium: 3.1 mEq/L — ABNORMAL LOW (ref 3.5–5.3)

## 2010-12-03 LAB — CANCER ANTIGEN 27.29: CA 27.29: 20 U/mL (ref 0–39)

## 2010-12-03 LAB — LACTATE DEHYDROGENASE: LDH: 111 U/L (ref 94–250)

## 2010-12-11 ENCOUNTER — Other Ambulatory Visit (HOSPITAL_COMMUNITY): Payer: Medicare HMO

## 2010-12-11 ENCOUNTER — Encounter (HOSPITAL_COMMUNITY)
Admission: RE | Admit: 2010-12-11 | Discharge: 2010-12-11 | Disposition: A | Discharge status: Home / Self Care | Payer: Medicare HMO | Source: Ambulatory Visit | Attending: General Surgery | Admitting: General Surgery

## 2010-12-11 ENCOUNTER — Other Ambulatory Visit: Payer: Self-pay | Admitting: Family Medicine

## 2010-12-11 LAB — CBC
HCT: 38 % (ref 36.0–46.0)
Platelets: 160 10*3/uL (ref 150–400)
RDW: 13.4 % (ref 11.5–15.5)
WBC: 4.6 10*3/uL (ref 4.0–10.5)

## 2010-12-11 LAB — BASIC METABOLIC PANEL
Chloride: 105 mEq/L (ref 96–112)
GFR calc Af Amer: >60 mL/min (ref >60)
Potassium: 4.3 mEq/L (ref 3.5–5.1)
Sodium: 144 mEq/L (ref 135–145)

## 2010-12-11 LAB — SURGICAL PCR SCREEN
MRSA, PCR: NEGATIVE
Staphylococcus aureus: NEGATIVE

## 2010-12-17 ENCOUNTER — Ambulatory Visit (HOSPITAL_COMMUNITY)
Admission: RE | Admit: 2010-12-17 | Discharge: 2010-12-17 | Disposition: A | Discharge status: Home / Self Care | Payer: Medicare HMO | Source: Ambulatory Visit | Attending: General Surgery | Admitting: General Surgery

## 2010-12-17 ENCOUNTER — Other Ambulatory Visit: Payer: Self-pay

## 2010-12-17 DIAGNOSIS — Z853 Personal history of malignant neoplasm of breast: Secondary | ICD-10-CM | POA: Insufficient documentation

## 2010-12-17 DIAGNOSIS — J701 Chronic and other pulmonary manifestations due to radiation: Secondary | ICD-10-CM | POA: Insufficient documentation

## 2010-12-17 DIAGNOSIS — Z452 Encounter for adjustment and management of vascular access device: Secondary | ICD-10-CM | POA: Insufficient documentation

## 2010-12-17 DIAGNOSIS — I10 Essential (primary) hypertension: Secondary | ICD-10-CM | POA: Insufficient documentation

## 2010-12-17 DIAGNOSIS — Z01812 Encounter for preprocedural laboratory examination: Secondary | ICD-10-CM | POA: Insufficient documentation

## 2010-12-17 DIAGNOSIS — J45909 Unspecified asthma, uncomplicated: Secondary | ICD-10-CM | POA: Insufficient documentation

## 2010-12-17 NOTE — Op Note (Signed)
  Rebekah Moreno, Rebekah Moreno             ACCOUNT NO.:  [id number]  MEDICAL RECORD NO.:  [id number]  LOCATION:  [facility]SC                         FACILITY:  [facility]  PHYSICIAN:  Adolph Pollack, M.D.DATE OF BIRTH:  [birth year]  DATE OF PROCEDURE:  12/17/2010 DATE OF DISCHARGE:                              OPERATIVE REPORT   PREOPERATIVE DIAGNOSIS:  Retained Port-A-Cath with a history of left breast cancer.  POSTOPERATIVE DIAGNOSIS:  Retained Port-A-Cath with a history of left breast cancer.  PROCEDURE:  Port-A-Cath removal.  SURGEON:  Adolph Pollack, MD  ANESTHESIA:  MAC with local (mixture of Xylocaine and Marcaine).  INDICATIONS:  [name] is a 65 year old female with left breast cancer.  She had required long-term venous access for chemotherapy and has now completed her treatments and no longer needs the Port-A-Cath. She presents for Port-A-Cath removal.  Procedure and risks (including, but not limited to bleeding, infection, wound healing problems) were discussed with her preoperatively.  TECHNIQUE:  She was seen in the holding area, brought to the operating room, placed supine on the operating room table, given intravenous sedation.  The right upper chest and neck were sterilely prepped and draped.  Local anesthetic was infiltrated to the previous Port-A-Cath incision site in this dermal area and subcutaneous tissue.  The previous scar was re-incised sharply and dissection carried down through the subcutaneous tissue.  I identified the catheter and the fibrous sheath around it.  I dissected the fiber sheath free from the catheter and then removed the catheter from the right internal jugular vein.  Direct pressure was held over the right internal jugular vein.  Using electrocautery, I dissected the port free from the chest wall and removed it intact.  Bleeding was controlled with electrocautery.  Once hemostasis was adequate, I closed the wound in 2 layers.   The subcutaneous tissue was closed with a running 3-0 Vicryl suture.  The skin was closed with a 4-0 Monocryl subcuticular stitch.  Steri-Strips and sterile dressings were applied.  She tolerated the procedure well without any apparent complications and was taken to recovery room in satisfactory condition.     Adolph Pollack, M.D.     Kari Baars  D:  12/17/2010  T:  12/17/2010  Job:  [id number]  Electronically Signed by [name] M.D. on 12/17/2010 09:38:35 AM

## 2010-12-17 NOTE — Telephone Encounter (Signed)
rx request for alprazolam 0.25; last ov 10/30/10. Pls advise

## 2010-12-18 MED ORDER — ALPRAZOLAM 0.25 MG PO TABS: 0.2500 mg | ORAL_TABLET | Freq: Three times a day (TID) | ORAL | Status: DC | PRN

## 2010-12-18 NOTE — Telephone Encounter (Signed)
She takes this tid. Call in #[phone] with 5 rf

## 2010-12-18 NOTE — Telephone Encounter (Signed)
Addended by: Azucena Freed on: 12/18/2010 10:29 AM   Modules accepted: Orders

## 2011-01-09 ENCOUNTER — Encounter: Payer: Self-pay | Admitting: Family Medicine

## 2011-01-09 ENCOUNTER — Ambulatory Visit (INDEPENDENT_AMBULATORY_CARE_PROVIDER_SITE_OTHER): Payer: Medicare HMO | Admitting: Family Medicine

## 2011-01-09 VITALS — BP 138/90 | HR 100 | Temp 99.4°F | Wt 198.0 lb

## 2011-01-09 DIAGNOSIS — I10 Essential (primary) hypertension: Secondary | ICD-10-CM

## 2011-01-09 DIAGNOSIS — C50919 Malignant neoplasm of unspecified site of unspecified female breast: Secondary | ICD-10-CM

## 2011-01-09 DIAGNOSIS — F329 Major depressive disorder, single episode, unspecified: Secondary | ICD-10-CM

## 2011-01-09 DIAGNOSIS — F32A Depression, unspecified: Secondary | ICD-10-CM

## 2011-01-09 DIAGNOSIS — E876 Hypokalemia: Secondary | ICD-10-CM

## 2011-01-09 LAB — BASIC METABOLIC PANEL
Calcium: 9.2 mg/dL (ref 8.4–10.5)
GFR: 92.2 mL/min (ref >60.00)
Potassium: 3.9 mEq/L (ref 3.5–5.1)
Sodium: 140 mEq/L (ref 135–145)

## 2011-01-09 MED ORDER — CELECOXIB 200 MG PO CAPS: 200.0000 mg | ORAL_CAPSULE | Freq: Two times a day (BID) | ORAL | Status: DC

## 2011-01-09 NOTE — Progress Notes (Signed)
  Subjective:    Patient ID: Rebekah Moreno, female    DOB: [birth year], 65 y.o.   MRN: [id number]  HPI Here to follow up on HTN and a low potassium. She had breast reconstruction surgery in July, and this went well. Her potassium started to drop, so Dr. Park Breed started her on replacement. She feels well in general.    Review of Systems  Constitutional: Negative.   Respiratory: Negative.   Cardiovascular: Negative.   Psychiatric/Behavioral: Negative.        Objective:   Physical Exam  Constitutional: She appears well-developed and well-nourished.  Cardiovascular: Normal rate, regular rhythm, normal heart sounds and intact distal pulses.   Pulmonary/Chest: Effort normal and breath sounds normal.  Psychiatric: She has a normal mood and affect. Her behavior is normal. Thought content normal.          Assessment & Plan:  Doing well from a general health perspective. Check a BMET today

## 2011-01-10 ENCOUNTER — Telehealth: Payer: Self-pay | Admitting: Family Medicine

## 2011-01-10 MED ORDER — POTASSIUM CHLORIDE 10 MEQ PO TBCR: 10.0000 meq | EXTENDED_RELEASE_TABLET | Freq: Every day | ORAL | Status: DC

## 2011-01-10 NOTE — Telephone Encounter (Signed)
Script sent e-scribe and pt aware. 

## 2011-01-10 NOTE — Telephone Encounter (Signed)
Message copied by Baldemar Friday on Thu Jan 10, 2011  3:22 PM ------      Message from: Gershon Crane A      Created: Thu Jan 10, 2011  9:15 AM       Normal including the potassium. She can decrease her daily potassium from 20 mEq to 10 mEq as we discussed. Call in Klor-con 10 mEq daily for one year

## 2011-01-10 NOTE — Progress Notes (Signed)
Addended by: Aniceto Boss A on: 01/10/2011 03:22 PM   Modules accepted: Orders

## 2011-01-16 ENCOUNTER — Encounter (INDEPENDENT_AMBULATORY_CARE_PROVIDER_SITE_OTHER): Payer: Self-pay | Admitting: General Surgery

## 2011-01-16 ENCOUNTER — Ambulatory Visit (INDEPENDENT_AMBULATORY_CARE_PROVIDER_SITE_OTHER): Payer: Medicare HMO | Admitting: General Surgery

## 2011-01-16 DIAGNOSIS — C50919 Malignant neoplasm of unspecified site of unspecified female breast: Secondary | ICD-10-CM

## 2011-01-16 NOTE — Progress Notes (Signed)
Rebekah Moreno is here for a postop check after portacath removal.  Exam:  Right chest wall incision is c/d/i.  Assess:  Wound healing well.  Plan:  F/u left breast CA visit in 5 months.

## 2011-01-25 ENCOUNTER — Ambulatory Visit
Admission: RE | Admit: 2011-01-25 | Discharge: 2011-01-25 | Disposition: A | Discharge status: Home / Self Care | Payer: Medicare HMO | Source: Ambulatory Visit | Attending: Oncology | Admitting: Oncology

## 2011-01-25 DIAGNOSIS — Z9010 Acquired absence of unspecified breast and nipple: Secondary | ICD-10-CM

## 2011-02-26 LAB — CBC
HCT: 36.9
Hemoglobin: 12.6
WBC: 6.3

## 2011-03-14 ENCOUNTER — Other Ambulatory Visit: Payer: Self-pay | Admitting: Family Medicine

## 2011-03-14 NOTE — Telephone Encounter (Signed)
Script sent e-scribe 

## 2011-03-19 ENCOUNTER — Other Ambulatory Visit: Payer: Self-pay | Admitting: Family Medicine

## 2011-03-19 NOTE — Telephone Encounter (Signed)
Script sent e-scribe 

## 2011-03-20 ENCOUNTER — Other Ambulatory Visit: Payer: Self-pay | Admitting: Oncology

## 2011-03-20 ENCOUNTER — Encounter (HOSPITAL_BASED_OUTPATIENT_CLINIC_OR_DEPARTMENT_OTHER): Payer: Medicare HMO | Admitting: Oncology

## 2011-03-20 ENCOUNTER — Telehealth: Payer: Self-pay | Admitting: *Deleted

## 2011-03-20 DIAGNOSIS — J329 Chronic sinusitis, unspecified: Secondary | ICD-10-CM

## 2011-03-20 DIAGNOSIS — Z9010 Acquired absence of unspecified breast and nipple: Secondary | ICD-10-CM

## 2011-03-20 DIAGNOSIS — Z853 Personal history of malignant neoplasm of breast: Secondary | ICD-10-CM

## 2011-03-20 DIAGNOSIS — Z5111 Encounter for antineoplastic chemotherapy: Secondary | ICD-10-CM

## 2011-03-20 DIAGNOSIS — C50219 Malignant neoplasm of upper-inner quadrant of unspecified female breast: Secondary | ICD-10-CM

## 2011-03-20 LAB — CBC WITH DIFFERENTIAL/PLATELET
Basophils Absolute: 0 10*3/uL (ref 0.0–0.1)
EOS%: 4 % (ref 0.0–7.0)
Eosinophils Absolute: 0.2 10*3/uL (ref 0.0–0.5)
HCT: 37 % (ref 34.8–46.6)
HGB: 12.5 g/dL (ref 11.6–15.9)
MCH: 29.8 pg (ref 25.1–34.0)
MCV: 88.1 fL (ref 79.5–101.0)
MONO%: 6.4 % (ref 0.0–14.0)
NEUT#: 3.7 10*3/uL (ref 1.5–6.5)
NEUT%: 71.8 % (ref 38.4–76.8)
lymph#: 0.9 10*3/uL (ref 0.9–3.3)

## 2011-03-20 LAB — COMPREHENSIVE METABOLIC PANEL
AST: 19 U/L (ref 0–37)
Albumin: 4.5 g/dL (ref 3.5–5.2)
BUN: 21 mg/dL (ref 6–23)
Calcium: 9.6 mg/dL (ref 8.4–10.5)
Chloride: 102 mEq/L (ref 96–112)
Creatinine, Ser: 0.87 mg/dL (ref 0.50–1.10)
Glucose, Bld: 98 mg/dL (ref 70–99)
Potassium: 3.8 mEq/L (ref 3.5–5.3)

## 2011-04-16 ENCOUNTER — Other Ambulatory Visit: Payer: Self-pay | Admitting: Family Medicine

## 2011-04-18 ENCOUNTER — Telehealth: Payer: Self-pay | Admitting: Family Medicine

## 2011-04-18 NOTE — Telephone Encounter (Signed)
Pt has a bad cough and is causing pain in her back and is wanting to be worked in today

## 2011-04-18 NOTE — Telephone Encounter (Signed)
Pt called back at 12:42pm - she still had not heard from anyone. Too late to get her in today now. I made her an appt with Dr. Clent Ridges for tomorrow.

## 2011-04-19 ENCOUNTER — Encounter: Payer: Self-pay | Admitting: Family Medicine

## 2011-04-19 ENCOUNTER — Ambulatory Visit (INDEPENDENT_AMBULATORY_CARE_PROVIDER_SITE_OTHER): Payer: Medicare HMO | Admitting: Family Medicine

## 2011-04-19 VITALS — BP 112/72 | HR 90 | Temp 98.9°F | Wt 203.0 lb

## 2011-04-19 DIAGNOSIS — J40 Bronchitis, not specified as acute or chronic: Secondary | ICD-10-CM

## 2011-04-19 MED ORDER — HYDROCODONE-HOMATROPINE 5-1.5 MG/5ML PO SYRP: 5.0000 mL | ORAL_SOLUTION | ORAL | Status: AC | PRN

## 2011-04-19 MED ORDER — LEVALBUTEROL TARTRATE 45 MCG/ACT IN AERO: 2.0000 | INHALATION_SPRAY | RESPIRATORY_TRACT | Status: DC | PRN

## 2011-04-19 MED ORDER — LEVOFLOXACIN 500 MG PO TABS: 500.0000 mg | ORAL_TABLET | Freq: Every day | ORAL | Status: AC

## 2011-04-19 NOTE — Progress Notes (Signed)
  Subjective:    Patient ID: [place] Pink, female    DOB: 06-07-1945, 65 y.o.   MRN: [id number]  HPI Here for one week of sinus presure and coughing up green sputum. On Mucinex   Review of Systems  Constitutional: Negative.   HENT: Positive for congestion and postnasal drip.   Eyes: Negative.   Respiratory: Positive for cough.        Objective:   Physical Exam  Constitutional: She appears well-developed and well-nourished.  HENT:  Right Ear: External ear normal.  Left Ear: External ear normal.  Nose: Nose normal.  Mouth/Throat: Oropharynx is clear and moist. No oropharyngeal exudate.  Eyes: Conjunctivae are normal.  Neck: No thyromegaly present.  Pulmonary/Chest: Effort normal and breath sounds normal.  Lymphadenopathy:    She has no cervical adenopathy.          Assessment & Plan:  Recheck prn

## 2011-05-02 ENCOUNTER — Telehealth: Payer: Self-pay | Admitting: *Deleted

## 2011-05-02 NOTE — Telephone Encounter (Signed)
patient called and confirmed her 06-19-2011 over the phone

## 2011-05-03 ENCOUNTER — Encounter (HOSPITAL_COMMUNITY): Payer: Self-pay | Admitting: *Deleted

## 2011-05-03 ENCOUNTER — Telehealth: Payer: Self-pay | Admitting: *Deleted

## 2011-05-03 ENCOUNTER — Emergency Department (HOSPITAL_COMMUNITY)
Admission: EM | Admit: 2011-05-03 | Discharge: 2011-05-03 | Disposition: A | Discharge status: Home / Self Care | Payer: Medicare HMO | Attending: Emergency Medicine | Admitting: Emergency Medicine

## 2011-05-03 DIAGNOSIS — K625 Hemorrhage of anus and rectum: Secondary | ICD-10-CM | POA: Insufficient documentation

## 2011-05-03 DIAGNOSIS — I10 Essential (primary) hypertension: Secondary | ICD-10-CM | POA: Insufficient documentation

## 2011-05-03 DIAGNOSIS — K645 Perianal venous thrombosis: Secondary | ICD-10-CM | POA: Insufficient documentation

## 2011-05-03 DIAGNOSIS — Z853 Personal history of malignant neoplasm of breast: Secondary | ICD-10-CM | POA: Insufficient documentation

## 2011-05-03 LAB — DIFFERENTIAL
Basophils Relative: 0 % (ref 0–1)
Lymphocytes Relative: 22 % (ref 12–46)
Lymphs Abs: 1.2 10*3/uL (ref 0.7–4.0)
Monocytes Absolute: 0.5 10*3/uL (ref 0.1–1.0)
Monocytes Relative: 8 % (ref 3–12)
Neutro Abs: 3.5 10*3/uL (ref 1.7–7.7)
Neutrophils Relative %: 66 % (ref 43–77)

## 2011-05-03 LAB — CBC
HCT: 34.1 % — ABNORMAL LOW (ref 36.0–46.0)
Hemoglobin: 11.5 g/dL — ABNORMAL LOW (ref 12.0–15.0)
MCHC: 33.7 g/dL (ref 30.0–36.0)
RBC: 3.99 MIL/uL (ref 3.87–5.11)
WBC: 5.4 10*3/uL (ref 4.0–10.5)

## 2011-05-03 MED ORDER — POLYETHYLENE GLYCOL 3350 17 GM/SCOOP PO POWD: 17.0000 g | Freq: Every day | ORAL | Status: AC

## 2011-05-03 MED ORDER — ALBUTEROL SULFATE HFA 108 (90 BASE) MCG/ACT IN AERS: 2.0000 | INHALATION_SPRAY | RESPIRATORY_TRACT | Status: DC | PRN

## 2011-05-03 NOTE — Telephone Encounter (Signed)
Request from pt to change Xopenex to Proventil due to insurance. Dr. Clent Ridges did approve and I sent in script e-scribe.

## 2011-05-03 NOTE — ED Provider Notes (Signed)
History     CSN: [id number] Arrival date & time: 05/03/2011  4:05 PM   First MD Initiated Contact with Patient 05/03/11 1754      Chief Complaint  Patient presents with  . Rectal Bleeding    (Consider location/radiation/quality/duration/timing/severity/associated sxs/prior treatment) HPI 65 year old female presents emergency department with complaint of possible rectal bleeding. Patient reports she woke up in the middle of night and found blood in her bed sheets and in her pajamas. Patient denies any pain. Patient reports she is not on any estrogens, has history of breast cancer status post mastectomy. Patient does have hemorrhoids. She denies any recent pain or swelling around her rectum. Patient had normal bowel movement this morning, no blood onto a paper at that time. No prior history of painless rectal bleeding Past Medical History  Diagnosis Date  . Hypertension   . Hyperlipidemia   . GERD (gastroesophageal reflux disease)   . Asthma   . Arthritis   . Colon polyps   . Compression fx, lumbar spine     l2  . Osteoporosis   . Hematuria   . Anxiety   . Peptic stricture of esophagus   . Hemorrhoids   . Cancer     Breast, sees Dr. Dara Lords     Past Surgical History  Procedure Date  . Knee arthroscopy     left  . Joint replacement     left knee  . Left knee arthroscopy  02/2001  . Total knee arthroplasty left 12/2--3    dr s [place] Diones dean  . L3,l4 discectomy 11/2000    drkritzer  . Rhinoplasty     dr Jearld Fenton  . Hemorrhoid surgery   . Mastectomy 11/11    left breast  . Portacath placement     Family History  Problem Relation Age of Onset  . Arthritis    . Hypertension    . Cancer    . Stroke    . Heart disease    . Heart disease Paternal Grandmother     History  Substance Use Topics  . Smoking status: Never Smoker   . Smokeless tobacco: Never Used  . Alcohol Use: 0.5 oz/week    1 drink(s) per week     rare    OB History    Grav Para Term Preterm  Abortions TAB SAB Ect Mult Living                  Review of Systems  All other systems reviewed and are negative.    Allergies  Dust mite extract and Mold extract  Home Medications   Current Outpatient Rx  Name Route Sig Dispense Refill  . ALPRAZOLAM 0.25 MG PO TABS Oral Take 1 tablet (0.25 mg total) by mouth 3 (three) times daily as needed. 90 tablet 5  . CELECOXIB 200 MG PO CAPS Oral Take 1 capsule (200 mg total) by mouth 2 (two) times daily. 2 capsule 0    PT NEEDS NEW RX  . ENALAPRIL-HYDROCHLOROTHIAZIDE 10-25 MG PO TABS  TAKE 1 TABLET BY MOUTH EVERY DAY 30 tablet 6  . LEVALBUTEROL TARTRATE 45 MCG/ACT IN AERO Inhalation Inhale 2 puffs into the lungs every 4 (four) hours as needed for wheezing or shortness of breath. 1 Inhaler 11  . CENTRUM ULTRA WOMENS PO TABS Oral Take 1 tablet by mouth daily.      Marland Kitchen NEXIUM 40 MG PO CPDR  TAKE 1 CAPSULE EVERY DAY 30 capsule 10  . POTASSIUM CHLORIDE 10  MEQ PO TBCR Oral Take 1 tablet (10 mEq total) by mouth daily. 30 tablet 11  . SERTRALINE HCL 100 MG PO TABS  TAKE 1 TABLET EVERY DAY 30 tablet 5  . SIMVASTATIN 40 MG PO TABS Oral Take 40 mg by mouth at bedtime.      . ACYCLOVIR 400 MG PO TABS Oral Take 400 mg by mouth 3 (three) times daily as needed.      . ALBUTEROL SULFATE HFA 108 (90 BASE) MCG/ACT IN AERS Inhalation Inhale 2 puffs into the lungs every 4 (four) hours as needed for wheezing. 1 Inhaler 11  . CYCLOBENZAPRINE HCL 10 MG PO TABS Oral Take 10 mg by mouth.      . ERGOCALCIFEROL 50000 UNITS PO CAPS Oral Take 50,000 Units by mouth once a week.      Marland Kitchen ONDANSETRON HCL 8 MG PO TABS      . PROCHLORPERAZINE MALEATE 10 MG PO TABS        BP 142/79  Pulse 92  Temp(Src) 98.5 F (36.9 C) (Oral)  Resp 16  Ht 5\' 7"  (1.702 m)  Wt 200 lb (90.719 kg)  BMI 31.32 kg/m2  SpO2 96%  Physical Exam  Nursing note and vitals reviewed. Constitutional: She appears well-developed and well-nourished.  HENT:  Head: Normocephalic and atraumatic.  Eyes:  Conjunctivae and EOM are normal. Pupils are equal, round, and reactive to light.  Neck: Normal range of motion. Neck supple.  Cardiovascular: Regular rhythm, normal heart sounds and intact distal pulses.   Pulmonary/Chest: Effort normal and breath sounds normal.  Abdominal: Soft. Bowel sounds are normal. She exhibits no distension and no mass. There is no tenderness. There is no rebound and no guarding.  Genitourinary:       Rectal exam performed patient noted to have thrombosed hemorrhoid at 6:00. Hemorrhoid is soft, supple no active bleeding from this site at this time. Rectal exam was performed, no blood in rectum no pain with palpation of hemorrhoid  Skin: She is not diaphoretic.    ED Course  Procedures (including critical care time)   Labs Reviewed  CBC  DIFFERENTIAL  POCT OCCULT BLOOD STOOL, DEVICE   No results found.   No diagnosis found.    MDM  65 year old female with episode of painless rectal bleeding found to have thrombosed hemorrhoid on exam. Suspect the patient had spontaneous rupture of this hemorrhoid. Patient has been seen before by [facility], but does not remarkable last colonoscopy. Plan is to check a CBC. Patient to followup either with her primary care doctor or with [facility] if she has further bleeding or problems. Recommended patient starting on a stool softener and doing sitz baths       Olivia Mackie, MD 05/03/11 [phone]

## 2011-05-03 NOTE — Telephone Encounter (Signed)
Pt woke up this am with blood in the back of her PJ and all over her sheets. She states that it was bright red. I advised pt to go to either [facility] or [facility] ED to get evaluated. Pt agreed.

## 2011-05-03 NOTE — ED Notes (Signed)
Pt states that she woke up in the middle of the night and noticed that she had a large amount of blood on her covers and in her pajamas that appears to have come from her rectum. Pt states that she has not noticed any more blood in her stool and that she had another BM around 1000 and did not have any blood in her stool at that time nor did she have any smears on the toilet paper.

## 2011-05-28 ENCOUNTER — Telehealth: Payer: Self-pay | Admitting: *Deleted

## 2011-05-28 NOTE — Telephone Encounter (Signed)
Please call [facility].  The state they have sent refill requests on Xanax multiple times.

## 2011-05-28 NOTE — Telephone Encounter (Signed)
Refill request for Alprazolam 0.25 mg take 1 po tid prn and pt last here on 04/19/11.

## 2011-05-29 ENCOUNTER — Telehealth: Payer: Self-pay | Admitting: Family Medicine

## 2011-05-29 MED ORDER — ALPRAZOLAM 0.25 MG PO TABS: 0.2500 mg | ORAL_TABLET | Freq: Three times a day (TID) | ORAL | Status: DC | PRN

## 2011-05-29 NOTE — Telephone Encounter (Signed)
error 

## 2011-05-29 NOTE — Telephone Encounter (Signed)
I called in script which Dr. Clent Ridges did approve and I left voice message for pt.

## 2011-05-29 NOTE — Telephone Encounter (Signed)
Pt called. Wants to know if this will be done. First initiated this through [facility] on Dec. 31. Thanks.

## 2011-06-17 ENCOUNTER — Other Ambulatory Visit: Payer: Self-pay

## 2011-06-17 MED ORDER — SIMVASTATIN 40 MG PO TABS: 40.0000 mg | ORAL_TABLET | Freq: Every day | ORAL | Status: DC

## 2011-06-19 ENCOUNTER — Ambulatory Visit (HOSPITAL_BASED_OUTPATIENT_CLINIC_OR_DEPARTMENT_OTHER): Payer: Medicare HMO | Admitting: Oncology

## 2011-06-19 ENCOUNTER — Other Ambulatory Visit: Payer: Medicare HMO | Admitting: Lab

## 2011-06-19 ENCOUNTER — Encounter: Payer: Self-pay | Admitting: Oncology

## 2011-06-19 DIAGNOSIS — I10 Essential (primary) hypertension: Secondary | ICD-10-CM

## 2011-06-19 DIAGNOSIS — M1710 Unilateral primary osteoarthritis, unspecified knee: Secondary | ICD-10-CM

## 2011-06-19 DIAGNOSIS — F329 Major depressive disorder, single episode, unspecified: Secondary | ICD-10-CM

## 2011-06-19 DIAGNOSIS — C50919 Malignant neoplasm of unspecified site of unspecified female breast: Secondary | ICD-10-CM

## 2011-06-19 DIAGNOSIS — E7800 Pure hypercholesterolemia, unspecified: Secondary | ICD-10-CM

## 2011-06-19 DIAGNOSIS — J329 Chronic sinusitis, unspecified: Secondary | ICD-10-CM

## 2011-06-19 LAB — COMPREHENSIVE METABOLIC PANEL
ALT: 14 U/L (ref 0–35)
AST: 17 U/L (ref 0–37)
Alkaline Phosphatase: 93 U/L (ref 39–117)
Creatinine, Ser: 0.8 mg/dL (ref 0.50–1.10)
Sodium: 140 mEq/L (ref 135–145)
Total Bilirubin: 0.5 mg/dL (ref 0.3–1.2)
Total Protein: 6.6 g/dL (ref 6.0–8.3)

## 2011-06-19 LAB — CBC WITH DIFFERENTIAL/PLATELET
BASO%: 0.8 % (ref 0.0–2.0)
EOS%: 4.4 % (ref 0.0–7.0)
HCT: 35.6 % (ref 34.8–46.6)
LYMPH%: 22.5 % (ref 14.0–49.7)
MCH: 29.3 pg (ref 25.1–34.0)
MCHC: 34.1 g/dL (ref 31.5–36.0)
NEUT%: 65 % (ref 38.4–76.8)
Platelets: 161 10*3/uL (ref 145–400)
RBC: 4.14 10*6/uL (ref 3.70–5.45)
WBC: 3.9 10*3/uL (ref 3.9–10.3)

## 2011-06-19 NOTE — Progress Notes (Signed)
OFFICE PROGRESS NOTE  CC [name] Dr. Richarda Overlie Dr. Enriqueta Shutter, MD, MD [address] [place] [address]  DIAGNOSIS: 66 year old female with multifocal invasive ductal carcinoma of the left breast status post left modified radical mastectomy with axillary lymph node dissection performed in November 2011. The tumor was ER negative PR negative HER-2/neu negative with a proliferation marker 18%  PRIOR THERAPY:  #1 patient underwent a mastectomy of the left breast with axillary lymph node dissection November 2011. She was found to have multifocal disease. The node dissection was negative for metastatic disease. Tumor was ER negative PR negative HER-2/neu negative proliferation marker 18%. She had immediate reconstruction.  #2 patient then received 4 cycles of adjuvant chemotherapy initially consisting of dose dense Adriamycin and Cytoxan from 04/25/2010 to 06/06/2010.  #3 she then received Taxol and carboplatinum from 06/21/2010 to 08/06/2010. She received a total of 7 cycles of combination. Patient developed peripheral paresthesias and severe myelo suppression. Thus carboplatin was discontinued and she went on to complete single agent Taxol from 08/20/2010 2 09/20/2010. Patient completed a total of 12 weeks of taxing containing regimen  #3 patient has undergone reconstruction by Dr. Etter Sjogren.  CURRENT THERAPY:Observation  INTERVAL HISTORY: Rebekah Moreno 66 y.o. female returns for Followup visit today. Clinically an oncological he she is doing well. She still has a little below that of residual per peripheral neuropathy in the balls of her left foot. She otherwise denies any fevers chills night sweats headaches shortness of breath chest pains palpitations she has no nausea or vomiting. She does continue to have significant anxiety she is on alprazolam. She is denying any myalgias or arthralgias. She has no hematuria hematochezia melena. Her  mammograms are all up-to-date. Remainder of the 10 point review of systems is negative.  MEDICAL HISTORY: Past Medical History  Diagnosis Date  . Hypertension   . Hyperlipidemia   . GERD (gastroesophageal reflux disease)   . Asthma   . Arthritis   . Colon polyps   . Compression fx, lumbar spine     l2  . Osteoporosis   . Hematuria   . Anxiety   . Peptic stricture of esophagus   . Hemorrhoids   . Cancer     Breast, sees Dr. Dara Lords     ALLERGIES:  is allergic to dust mite extract; mold extract; and other.  MEDICATIONS:  Current Outpatient Prescriptions  Medication Sig Dispense Refill  . celecoxib (CELEBREX) 200 MG capsule Take 1 capsule (200 mg total) by mouth 2 (two) times daily.  2 capsule  0  . enalapril-hydrochlorothiazide (VASERETIC) 10-25 MG per tablet TAKE 1 TABLET BY MOUTH EVERY DAY  30 tablet  6  . Multiple Vitamins-Minerals (CENTRUM ULTRA WOMENS) TABS Take 1 tablet by mouth daily.        Marland Kitchen NEXIUM 40 MG capsule TAKE 1 CAPSULE EVERY DAY  30 capsule  10  . ondansetron (ZOFRAN) 8 MG tablet       . sertraline (ZOLOFT) 100 MG tablet TAKE 1 TABLET EVERY DAY  30 tablet  5  . simvastatin (ZOCOR) 40 MG tablet Take 1 tablet (40 mg total) by mouth at bedtime.  30 tablet  11  . acyclovir (ZOVIRAX) 400 MG tablet Take 400 mg by mouth 3 (three) times daily as needed.       Marland Kitchen albuterol (PROVENTIL HFA) 108 (90 BASE) MCG/ACT inhaler Inhale 2 puffs into the lungs every 4 (four) hours as needed for wheezing.  1 Inhaler  11  . ALPRAZolam (XANAX) 0.25 MG tablet Take 1 tablet (0.25 mg total) by mouth 3 (three) times daily as needed.  90 tablet  5  . ergocalciferol (VITAMIN D2) 50000 UNITS capsule Take 50,000 Units by mouth once a week.        . potassium chloride (KLOR-CON 10) 10 MEQ CR tablet Take 1 tablet (10 mEq total) by mouth daily.  30 tablet  11  . prochlorperazine (COMPAZINE) 10 MG tablet         SURGICAL HISTORY:  Past Surgical History  Procedure Date  . Knee arthroscopy       left  . Joint replacement     left knee  . Left knee arthroscopy  02/2001  . Total knee arthroplasty left 12/2--3    dr s [place] Diones dean  . L3,l4 discectomy 11/2000    drkritzer  . Rhinoplasty     dr Jearld Fenton  . Hemorrhoid surgery   . Mastectomy 11/11    left breast  . Portacath placement     REVIEW OF SYSTEMS:  Pertinent items are noted in HPI.   PHYSICAL EXAMINATION: General appearance: alert, cooperative and appears stated age Head: Normocephalic, without obvious abnormality, atraumatic Neck: no adenopathy, no carotid bruit, no JVD, supple, symmetrical, trachea midline and thyroid not enlarged, symmetric, no tenderness/mass/nodules Lymph nodes: Cervical, supraclavicular, and axillary nodes normal. Resp: clear to auscultation bilaterally and normal percussion bilaterally Back: symmetric, no curvature. ROM normal. No CVA tenderness. Cardio: regular rate and rhythm, S1, S2 normal, no murmur, click, rub or gallop and normal apical impulse GI: soft, non-tender; bowel sounds normal; no masses,  no organomegaly Extremities: extremities normal, atraumatic, no cyanosis or edema Neurologic: Alert and oriented X 3, normal strength and tone. Normal symmetric reflexes. Normal coordination and gait Breast examination right breast no masses temple discharge no nipple retraction. Left reconstructed breast looks well healed there is no overlying skin changes she does have a reconstructed nipple present ECOG PERFORMANCE STATUS: 0 - Asymptomatic  Blood pressure 130/78, pulse 75, temperature 98.7 F (37.1 C), temperature source Oral, height 5\' 6"  (1.676 m), weight 203 lb 12.8 oz (92.443 kg).  LABORATORY DATA: Lab Results  Component Value Date   WBC 3.9 06/19/2011   HGB 12.1 06/19/2011   HCT 35.6 06/19/2011   MCV 86.0 06/19/2011   PLT 161 06/19/2011      Chemistry      Component Value Date/Time   NA 140 03/20/2011 1210   K 3.8 03/20/2011 1210   CL 102 03/20/2011 1210   CO2 29 03/20/2011 1210    BUN 21 03/20/2011 1210   CREATININE 0.87 03/20/2011 1210      Component Value Date/Time   CALCIUM 9.6 03/20/2011 1210   ALKPHOS 92 03/20/2011 1210   AST 19 03/20/2011 1210   ALT 17 03/20/2011 1210   BILITOT 0.5 03/20/2011 1210       RADIOGRAPHIC STUDIES:  No results found.  ASSESSMENT: 66 year old female with  #1 multifocal invasive ductal carcinoma of the left breast status post modified radical mastectomy with axillary lymph node dissection performed in November 2011. She is now status post adjuvant chemotherapy consisting of initially Adriamycin Cytoxan followed by Taxol carboplatinum.  #2 she has a reconstructed left breast.  #3 bilateral arthritis of the knees.  #4 depression and anxiety.  #5 hypercholesterolemia  #6 hypertension   PLAN:   #1 overall patient is doing well she has no evidence of recurrent disease. She is up to date  on her mammograms.  #2 she will be seen back in 3 months time in the survivor clinic with Colman Cater.  #3 I will plan on seeing her back in 6 months time.  #4 exercise and diet are discussed. And stress reduction.   All questions were answered. The patient knows to call the clinic with any problems, questions or concerns. We can certainly see the patient much sooner if necessary.  I spent 20 minutes counseling the patient face to face. The total time spent in the appointment was 30 minutes.    Drue Second, MD Medical/Oncology [facility] [phone] (beeper) [phone] (Office)  06/19/2011, 1:41 PM

## 2011-06-21 ENCOUNTER — Ambulatory Visit (INDEPENDENT_AMBULATORY_CARE_PROVIDER_SITE_OTHER): Payer: Medicare HMO | Admitting: General Surgery

## 2011-06-21 ENCOUNTER — Encounter (INDEPENDENT_AMBULATORY_CARE_PROVIDER_SITE_OTHER): Payer: Self-pay | Admitting: General Surgery

## 2011-06-21 VITALS — BP 134/78 | HR 66 | Temp 97.8°F | Resp 18 | Ht 67.0 in | Wt 204.0 lb

## 2011-06-21 DIAGNOSIS — C50212 Malignant neoplasm of upper-inner quadrant of left female breast: Secondary | ICD-10-CM | POA: Insufficient documentation

## 2011-06-21 DIAGNOSIS — Z853 Personal history of malignant neoplasm of breast: Secondary | ICD-10-CM

## 2011-06-21 NOTE — Progress Notes (Signed)
Operation:  Left mastectomy and reconstruction  Date:  November 2011   Hormone receptor status: Negative  HPI: She is here for followup of her left breast cancer. Mammogram September 2012 of the right breast was negative. She denies any new masses or adenopathy. She states she is otherwise doing well.  PE: Rebekah Moreno looks well and is in no acute distress  Right breast-no visible or palpable abnormalities.  Left breast-she has a reconstructed nipple and her breast reconstruction looks good.. No nodules around the incision or native skin.  Assessment:  Left breast cancer-no clinical evidence of recurrence.  Plan: Return visit 3 months.

## 2011-06-21 NOTE — Patient Instructions (Signed)
Call if you feel any new masses. 

## 2011-06-25 ENCOUNTER — Other Ambulatory Visit: Payer: Self-pay | Admitting: Family Medicine

## 2011-06-25 NOTE — Telephone Encounter (Signed)
Pt need refill on alprazolam call into [facility] [phone]

## 2011-06-26 NOTE — Telephone Encounter (Signed)
We already gave her a 6 month supply on 05-29-11

## 2011-06-26 NOTE — Telephone Encounter (Signed)
Pt stated pharm does not have refill on med

## 2011-06-26 NOTE — Telephone Encounter (Signed)
I spoke with pharmacy and the script was sent in with 5 refills, I just clarified that and the script is being filled.

## 2011-06-28 ENCOUNTER — Encounter: Payer: Self-pay | Admitting: Family Medicine

## 2011-06-28 ENCOUNTER — Ambulatory Visit (INDEPENDENT_AMBULATORY_CARE_PROVIDER_SITE_OTHER): Payer: Medicare HMO | Admitting: Family Medicine

## 2011-06-28 VITALS — BP 122/78 | HR 97 | Temp 98.5°F | Wt 204.0 lb

## 2011-06-28 DIAGNOSIS — J329 Chronic sinusitis, unspecified: Secondary | ICD-10-CM

## 2011-06-28 MED ORDER — LEVOFLOXACIN 500 MG PO TABS: 500.0000 mg | ORAL_TABLET | Freq: Every day | ORAL | Status: AC

## 2011-06-28 NOTE — Progress Notes (Signed)
  Subjective:    Patient ID: Rebekah Moreno, female    DOB: 1946-04-08, 66 y.o.   MRN: [id number]  HPI Here for 3 days of sinus pressure, PND, HA, and dry cough.   Review of Systems  Constitutional: Negative.   HENT: Positive for congestion, postnasal drip and sinus pressure.   Eyes: Negative.   Respiratory: Positive for cough.        Objective:   Physical Exam  Constitutional: She appears well-developed and well-nourished.  HENT:  Right Ear: External ear normal.  Left Ear: External ear normal.  Nose: Nose normal.  Mouth/Throat: Oropharynx is clear and moist. No oropharyngeal exudate.  Eyes: Conjunctivae are normal.  Pulmonary/Chest: Effort normal and breath sounds normal.  Lymphadenopathy:    She has no cervical adenopathy.          Assessment & Plan:  Add Mucinex prn

## 2011-08-13 ENCOUNTER — Ambulatory Visit (INDEPENDENT_AMBULATORY_CARE_PROVIDER_SITE_OTHER): Payer: Medicare HMO | Admitting: Family Medicine

## 2011-08-13 ENCOUNTER — Encounter: Payer: Self-pay | Admitting: Family Medicine

## 2011-08-13 VITALS — BP 122/84 | HR 80 | Temp 98.4°F | Ht 67.0 in | Wt 202.0 lb

## 2011-08-13 DIAGNOSIS — F411 Generalized anxiety disorder: Secondary | ICD-10-CM

## 2011-08-13 DIAGNOSIS — K219 Gastro-esophageal reflux disease without esophagitis: Secondary | ICD-10-CM

## 2011-08-13 DIAGNOSIS — F3289 Other specified depressive episodes: Secondary | ICD-10-CM

## 2011-08-13 DIAGNOSIS — F32A Depression, unspecified: Secondary | ICD-10-CM

## 2011-08-13 DIAGNOSIS — E785 Hyperlipidemia, unspecified: Secondary | ICD-10-CM

## 2011-08-13 DIAGNOSIS — F329 Major depressive disorder, single episode, unspecified: Secondary | ICD-10-CM

## 2011-08-13 DIAGNOSIS — I10 Essential (primary) hypertension: Secondary | ICD-10-CM

## 2011-08-13 DIAGNOSIS — F419 Anxiety disorder, unspecified: Secondary | ICD-10-CM

## 2011-08-13 LAB — CBC WITH DIFFERENTIAL/PLATELET
MCHC: 33.1 g/dL (ref 30.0–36.0)
MCV: 88.2 fl (ref 78.0–100.0)
Monocytes Absolute: 0.2 10*3/uL (ref 0.1–1.0)
Neutrophils Relative %: 64.7 % (ref 43.0–77.0)
Platelets: 155 10*3/uL (ref 150.0–400.0)
RDW: 14.3 % (ref 11.5–14.6)
WBC: 3.7 10*3/uL — ABNORMAL LOW (ref 4.5–10.5)

## 2011-08-13 LAB — BASIC METABOLIC PANEL
CO2: 23 mEq/L (ref 19–32)
Calcium: 9.4 mg/dL (ref 8.4–10.5)
Creatinine, Ser: 0.7 mg/dL (ref 0.4–1.2)
Glucose, Bld: 103 mg/dL — ABNORMAL HIGH (ref 70–99)
Sodium: 142 mEq/L (ref 135–145)

## 2011-08-13 LAB — HEPATIC FUNCTION PANEL
Albumin: 4.4 g/dL (ref 3.5–5.2)
Alkaline Phosphatase: 97 U/L (ref 39–117)
Total Protein: 7.5 g/dL (ref 6.0–8.3)

## 2011-08-13 LAB — POCT URINALYSIS DIPSTICK
Blood, UA: NEGATIVE
Glucose, UA: NEGATIVE
Nitrite, UA: NEGATIVE
Urobilinogen, UA: 0.2
pH, UA: 6

## 2011-08-13 LAB — LIPID PANEL
Cholesterol: 207 mg/dL — ABNORMAL HIGH (ref 0–200)
Triglycerides: 127 mg/dL (ref 0.0–149.0)

## 2011-08-13 MED ORDER — CELECOXIB 200 MG PO CAPS: 200.0000 mg | ORAL_CAPSULE | Freq: Two times a day (BID) | ORAL | Status: DC

## 2011-08-13 MED ORDER — SERTRALINE HCL 100 MG PO TABS: 100.0000 mg | ORAL_TABLET | Freq: Every day | ORAL | Status: DC

## 2011-08-13 MED ORDER — ENALAPRIL-HYDROCHLOROTHIAZIDE 10-25 MG PO TABS: 1.0000 | ORAL_TABLET | Freq: Every day | ORAL | Status: DC

## 2011-08-13 MED ORDER — ACYCLOVIR 400 MG PO TABS: 400.0000 mg | ORAL_TABLET | Freq: Three times a day (TID) | ORAL | Status: DC | PRN

## 2011-08-13 NOTE — Progress Notes (Signed)
  Subjective:    Patient ID: Rebekah Moreno, female    DOB: [birth year], 66 y.o.   MRN: [id number]  HPI 66 yr old female for a cpx. She feels well and has no concerns. She and a friend are exercising in the gym 3 days a week.    Review of Systems  Constitutional: Negative.   HENT: Negative.   Eyes: Negative.   Respiratory: Negative.   Cardiovascular: Negative.   Gastrointestinal: Negative.   Genitourinary: Negative for dysuria, urgency, frequency, hematuria, flank pain, decreased urine volume, enuresis, difficulty urinating, pelvic pain and dyspareunia.  Musculoskeletal: Negative.   Skin: Negative.   Neurological: Negative.   Hematological: Negative.   Psychiatric/Behavioral: Negative.        Objective:   Physical Exam  Constitutional: She is oriented to person, place, and time. She appears well-developed and well-nourished. No distress.  HENT:  Head: Normocephalic and atraumatic.  Right Ear: External ear normal.  Left Ear: External ear normal.  Nose: Nose normal.  Mouth/Throat: Oropharynx is clear and moist. No oropharyngeal exudate.  Eyes: Conjunctivae and EOM are normal. Pupils are equal, round, and reactive to light. No scleral icterus.  Neck: Normal range of motion. Neck supple. No JVD present. No thyromegaly present.  Cardiovascular: Normal rate, regular rhythm, normal heart sounds and intact distal pulses.  Exam reveals no gallop and no friction rub.   No murmur heard.      EKG normal   Pulmonary/Chest: Effort normal and breath sounds normal. No respiratory distress. She has no wheezes. She has no rales. She exhibits no tenderness.  Abdominal: Soft. Bowel sounds are normal. She exhibits no distension and no mass. There is no tenderness. There is no rebound and no guarding.  Musculoskeletal: Normal range of motion. She exhibits no edema and no tenderness.  Lymphadenopathy:    She has no cervical adenopathy.  Neurological: She is alert and oriented to person, place,  and time. She has normal reflexes. No cranial nerve deficit. She exhibits normal muscle tone. Coordination normal.  Skin: Skin is warm and dry. No rash noted. No erythema.  Psychiatric: She has a normal mood and affect. Her behavior is normal. Judgment and thought content normal.          Assessment & Plan:  Get fasting labs.

## 2011-08-28 ENCOUNTER — Encounter: Payer: Self-pay | Admitting: *Deleted

## 2011-09-03 ENCOUNTER — Emergency Department (HOSPITAL_COMMUNITY): Payer: Medicare HMO

## 2011-09-03 ENCOUNTER — Encounter (HOSPITAL_COMMUNITY): Payer: Self-pay | Admitting: *Deleted

## 2011-09-03 ENCOUNTER — Emergency Department (HOSPITAL_COMMUNITY)
Admission: EM | Admit: 2011-09-03 | Discharge: 2011-09-03 | Disposition: A | Discharge status: Home / Self Care | Payer: Medicare HMO | Attending: Emergency Medicine | Admitting: Emergency Medicine

## 2011-09-03 DIAGNOSIS — Z79899 Other long term (current) drug therapy: Secondary | ICD-10-CM | POA: Insufficient documentation

## 2011-09-03 DIAGNOSIS — M25579 Pain in unspecified ankle and joints of unspecified foot: Secondary | ICD-10-CM | POA: Insufficient documentation

## 2011-09-03 DIAGNOSIS — S0990XA Unspecified injury of head, initial encounter: Secondary | ICD-10-CM | POA: Insufficient documentation

## 2011-09-03 DIAGNOSIS — M810 Age-related osteoporosis without current pathological fracture: Secondary | ICD-10-CM | POA: Insufficient documentation

## 2011-09-03 DIAGNOSIS — S60219A Contusion of unspecified wrist, initial encounter: Secondary | ICD-10-CM | POA: Insufficient documentation

## 2011-09-03 DIAGNOSIS — Z853 Personal history of malignant neoplasm of breast: Secondary | ICD-10-CM | POA: Insufficient documentation

## 2011-09-03 DIAGNOSIS — W1789XA Other fall from one level to another, initial encounter: Secondary | ICD-10-CM | POA: Insufficient documentation

## 2011-09-03 DIAGNOSIS — S8001XA Contusion of right knee, initial encounter: Secondary | ICD-10-CM

## 2011-09-03 DIAGNOSIS — M25539 Pain in unspecified wrist: Secondary | ICD-10-CM | POA: Insufficient documentation

## 2011-09-03 DIAGNOSIS — S60212A Contusion of left wrist, initial encounter: Secondary | ICD-10-CM

## 2011-09-03 DIAGNOSIS — M25569 Pain in unspecified knee: Secondary | ICD-10-CM | POA: Insufficient documentation

## 2011-09-03 DIAGNOSIS — IMO0002 Reserved for concepts with insufficient information to code with codable children: Secondary | ICD-10-CM | POA: Insufficient documentation

## 2011-09-03 DIAGNOSIS — S5002XA Contusion of left elbow, initial encounter: Secondary | ICD-10-CM

## 2011-09-03 DIAGNOSIS — S93402A Sprain of unspecified ligament of left ankle, initial encounter: Secondary | ICD-10-CM

## 2011-09-03 DIAGNOSIS — J45909 Unspecified asthma, uncomplicated: Secondary | ICD-10-CM | POA: Insufficient documentation

## 2011-09-03 DIAGNOSIS — W19XXXA Unspecified fall, initial encounter: Secondary | ICD-10-CM

## 2011-09-03 DIAGNOSIS — M25473 Effusion, unspecified ankle: Secondary | ICD-10-CM | POA: Insufficient documentation

## 2011-09-03 DIAGNOSIS — H532 Diplopia: Secondary | ICD-10-CM | POA: Insufficient documentation

## 2011-09-03 DIAGNOSIS — S0081XA Abrasion of other part of head, initial encounter: Secondary | ICD-10-CM

## 2011-09-03 DIAGNOSIS — S8000XA Contusion of unspecified knee, initial encounter: Secondary | ICD-10-CM | POA: Insufficient documentation

## 2011-09-03 DIAGNOSIS — M25476 Effusion, unspecified foot: Secondary | ICD-10-CM | POA: Insufficient documentation

## 2011-09-03 DIAGNOSIS — K219 Gastro-esophageal reflux disease without esophagitis: Secondary | ICD-10-CM | POA: Insufficient documentation

## 2011-09-03 DIAGNOSIS — M79609 Pain in unspecified limb: Secondary | ICD-10-CM | POA: Insufficient documentation

## 2011-09-03 DIAGNOSIS — I10 Essential (primary) hypertension: Secondary | ICD-10-CM | POA: Insufficient documentation

## 2011-09-03 DIAGNOSIS — S5000XA Contusion of unspecified elbow, initial encounter: Secondary | ICD-10-CM | POA: Insufficient documentation

## 2011-09-03 DIAGNOSIS — S93409A Sprain of unspecified ligament of unspecified ankle, initial encounter: Secondary | ICD-10-CM | POA: Insufficient documentation

## 2011-09-03 DIAGNOSIS — S8002XA Contusion of left knee, initial encounter: Secondary | ICD-10-CM

## 2011-09-03 DIAGNOSIS — E785 Hyperlipidemia, unspecified: Secondary | ICD-10-CM | POA: Insufficient documentation

## 2011-09-03 DIAGNOSIS — R51 Headache: Secondary | ICD-10-CM | POA: Insufficient documentation

## 2011-09-03 DIAGNOSIS — S022XXA Fracture of nasal bones, initial encounter for closed fracture: Secondary | ICD-10-CM

## 2011-09-03 MED ORDER — TETANUS-DIPHTH-ACELL PERTUSSIS 5-2.5-18.5 LF-MCG/0.5 IM SUSP
0.5000 mL | Freq: Once | INTRAMUSCULAR | Status: AC
  Administered 2011-09-03: 0.5 mL via INTRAMUSCULAR
  Filled 2011-09-03: qty 0.5

## 2011-09-03 MED ORDER — HYDROCODONE-ACETAMINOPHEN 5-325 MG PO TABS
2.0000 | ORAL_TABLET | Freq: Once | ORAL | Status: AC
  Administered 2011-09-03: 2 via ORAL
  Filled 2011-09-03: qty 2

## 2011-09-03 MED ORDER — HYDROCODONE-ACETAMINOPHEN 5-325 MG PO TABS
2.0000 | ORAL_TABLET | Freq: Four times a day (QID) | ORAL | Status: AC | PRN
Start: 2011-09-03 — End: 2011-09-13

## 2011-09-03 MED ORDER — METHOCARBAMOL 500 MG PO TABS: ORAL_TABLET | ORAL | Status: DC

## 2011-09-03 NOTE — ED Notes (Signed)
Pt. Offered wheelchair but refused.

## 2011-09-03 NOTE — Discharge Instructions (Signed)
Use generic triple antibiotic ointment on the abrasions to prevent infection. Take the Norco for pain. Take the Robaxin for contusion pain. Use ice packs to the swollen or tender areas. Have Dr. Odis Luster recheck your nose. Wear the ankle and wrist support until you are rechecked by Dr Shelle Iron.  Have Dr.Beane recheck you next week, call his office to get an appointment.  Return to the ED for any problems listed on the head injury sheet.

## 2011-09-03 NOTE — ED Provider Notes (Signed)
History     CSN: [id number]  Arrival date & time 09/03/11  1450   First MD Initiated Contact with Patient 09/03/11 1458      Chief Complaint  Patient presents with  . Fall  . Loss of Consciousness    (Consider location/radiation/quality/duration/timing/severity/associated sxs/prior treatment) HPI  Patient relates she was sitting on a retaining wall and for an unknown reason she fell. She states [place] was [place] and she rolled down a bit driveway and her face hit a concrete column and then she rolled further down the hill. She states that her head hit the ischemic column she heard a crack. She denies loss of consciousness to me. She states she remembers sitting on the retaining wall and remembers falling, remembers hitting her head, and remembers falling after that. Her friend was there and was chasing her to the driveway was unable to catch her. She also denies loss of consciousness. She denies blurred vision, nausea, or vomiting. Patient relates she has some oozing of her left elbow, has some pain in her left wrist, right thumb, left ankle, both knees, and her face.  PCP Dr. Clent Ridges  Past Medical History  Diagnosis Date  . Hypertension   . Hyperlipidemia   . GERD (gastroesophageal reflux disease)   . Asthma   . Arthritis   . Colon polyps   . Compression fx, lumbar spine     l2  . Osteoporosis   . Hematuria   . Anxiety   . Peptic stricture of esophagus   . Hemorrhoids   . Cancer     Breast, sees Dr. Dara Lords     Past Surgical History  Procedure Date  . Knee arthroscopy     left  . Joint replacement     left knee  . Left knee arthroscopy  02/2001  . Total knee arthroplasty left 12/2--3    dr s [place] Diones dean  . L3,l4 discectomy 11/2000    per Dr. Gerlene Fee   . Rhinoplasty     Dr Odis Luster  . Hemorrhoid surgery   . Mastectomy 11/11    left breast  . Portacath placement   . Colonoscopy 11-10-07    per Dr. Russella Dar, int and ext hemorrhoids, repeat in 5 yrs   . Port-a-cath  removal     Family History  Problem Relation Age of Onset  . Arthritis    . Hypertension    . Cancer    . Stroke    . Heart disease    . Heart disease Paternal Grandmother    Last tetanus unknown  History  Substance Use Topics  . Smoking status: Never Smoker   . Smokeless tobacco: Never Used  . Alcohol Use: 0.5 oz/week    1 drink(s) per week  retired  OB History    Grav Para Term Preterm Abortions TAB SAB Ect Mult Living                  Review of Systems  All other systems reviewed and are negative.    Allergies  Dust mite extract; Mold extract; and Other  Home Medications   Current Outpatient Rx  Name Route Sig Dispense Refill  . ACYCLOVIR 400 MG PO TABS Oral Take 400 mg by mouth every 8 (eight) hours as needed. Cold sore.    Marland Kitchen ALPRAZOLAM 0.25 MG PO TABS Oral Take 0.25 mg by mouth 3 (three) times daily as needed. Anxiety.    . CELECOXIB 200 MG PO CAPS Oral  Take 200 mg by mouth daily.    . ENALAPRIL-HYDROCHLOROTHIAZIDE 10-25 MG PO TABS Oral Take 1 tablet by mouth daily. 30 tablet 11  . CENTRUM ULTRA WOMENS PO TABS Oral Take 1 tablet by mouth daily.      Marland Kitchen NEXIUM 40 MG PO CPDR  TAKE 1 CAPSULE EVERY DAY 30 capsule 10  . POTASSIUM CHLORIDE 10 MEQ PO TBCR Oral Take 1 tablet (10 mEq total) by mouth daily. 30 tablet 11  . SERTRALINE HCL 100 MG PO TABS Oral Take 1 tablet (100 mg total) by mouth daily. 30 tablet 11  . SIMVASTATIN 40 MG PO TABS Oral Take 1 tablet (40 mg total) by mouth at bedtime. 30 tablet 11  . ALBUTEROL SULFATE HFA 108 (90 BASE) MCG/ACT IN AERS Inhalation Inhale 1 puff into the lungs every 4 (four) hours as needed. Wheezing/shortness of breath.      There were no vitals taken for this visit.  Physical Exam  Constitutional: She is oriented to person, place, and time. She appears well-developed and well-nourished.  Non-toxic appearance. She does not appear ill. No distress.  HENT:  Head: Normocephalic and atraumatic.  Right Ear: External ear normal.   Left Ear: External ear normal.  Nose: Nose normal. No mucosal edema or rhinorrhea.  Mouth/Throat: Oropharynx is clear and moist and mucous membranes are normal. No dental abscesses or uvula swelling.       Patient has deep abrasion on the left side of her nose extending into her left medial cheek. She has some tenderness to palpation in the same area. She does not have obvious disconjugate gaze but she states she has some mild double vision when she looks upward.  Eyes: Conjunctivae and EOM are normal. Pupils are equal, round, and reactive to light.  Neck: Normal range of motion and full passive range of motion without pain. Neck supple.       Neck is nontender she has good range of motion with normal conversation  Cardiovascular: Normal rate, regular rhythm and normal heart sounds.  Exam reveals no gallop and no friction rub.   No murmur heard. Pulmonary/Chest: Effort normal and breath sounds normal. No respiratory distress. She has no wheezes. She has no rhonchi. She has no rales. She exhibits no tenderness and no crepitus.  Abdominal: Soft. Normal appearance and bowel sounds are normal. She exhibits no distension. There is no tenderness. There is no rebound and no guarding.  Musculoskeletal: Normal range of motion. She exhibits no edema and no tenderness.       Patient has some tenderness over her lateral malleolus of her left ankle with mild swelling. Her foot is nontender. She has good distal pulses. She has minor epidermal abrasions of is. There is no obvious joint effusion seen. Patient has a bruise/abrasion on her left elbow without effusion. She has some discomfort on the ulnar aspect of her left wrist however she has intact range of motion.  Neurological: She is alert and oriented to person, place, and time. She has normal strength. No cranial nerve deficit.  Skin: Skin is warm, dry and intact. No rash noted. No erythema. No pallor.  Psychiatric: She has a normal mood and affect. Her  speech is normal and behavior is normal. Her mood appears not anxious.    ED Course  Procedures (including critical care time)   Medications  HYDROcodone-acetaminophen (NORCO) 5-325 MG per tablet 2 tablet (2 tablet Oral Given 09/03/11 1540)  TDaP (BOOSTRIX) injection 0.5 mL (0.5 mL Intramuscular Given  09/03/11 1541)   Patient placed in ASO.  Labs Reviewed  GLUCOSE, CAPILLARY - Abnormal; Notable for the following:    Glucose-Capillary 108 (*)    All other components within normal limits   Dg Elbow Complete Left  09/03/2011  *RADIOLOGY REPORT*  Clinical Data: Fall, left elbow pain  LEFT ELBOW - COMPLETE 3+ VIEW  Comparison: None.  Findings: No fracture or dislocation.  No soft tissue abnormality. No radiopaque foreign body.  No joint effusion.  IMPRESSION: No acute osseous abnormality.  Original Report Authenticated By: Harrel Lemon, M.D.   Dg Wrist Complete Left  09/03/2011  *RADIOLOGY REPORT*  Clinical Data: Fall, left wrist pain  LEFT WRIST - COMPLETE 3+ VIEW  Comparison: None.  Findings: Well corticated osseous fragment adjacent to the ulnar styloid process likely represents accessory ossicle or remote avulsion fraction. Bones are osteopenic, which may mask subtle fracture.  Carpal rows are normally aligned.  No acute fracture or dislocation.  Scaphoid is normal.  First carpometacarpal joint degenerative change identified.  IMPRESSION: No acute osseous abnormality.  Original Report Authenticated By: Harrel Lemon, M.D.   Dg Ankle Complete Left  09/03/2011  *RADIOLOGY REPORT*  Clinical Data: Fall, left ankle pain  LEFT ANKLE COMPLETE - 3+ VIEW  Comparison: None.  Findings: The ankle mortise is symmetric.  No fracture or dislocation.  Mild plantar calcaneal spurring noted.  IMPRESSION: No acute abnormality of the left ankle.  Original Report Authenticated By: Harrel Lemon, M.D.   Ct Head Wo Contrast  09/03/2011  *RADIOLOGY REPORT*  Clinical Data:  Fall, syncope, facial  abrasions  CT HEAD WITHOUT CONTRAST CT MAXILLOFACIAL WITHOUT CONTRAST  Technique:  Multidetector CT imaging of the head and maxillofacial structures were performed using the standard protocol without intravenous contrast. Multiplanar CT image reconstructions of the maxillofacial structures were also generated.  Comparison:  Head CT 12/13/2008  CT HEAD  Findings: Minimal patchy periventricular white matter hypodensities likely indicate small vessel ischemic change. No acute hemorrhage, acute infarction, or mass lesion is identified.  No midline shift. No ventriculomegaly.  No skull fracture.  Orbits and paranasal sinuses are intact.  IMPRESSION: No acute intracranial finding.  CT MAXILLOFACIAL  Findings:   Streak artifact from dental amalgam is noted.  Orbits are unremarkable.  Soft tissue gas is noted over the left anterior mandible which could indicate laceration, correlate clinically. Discontinuity of the left greater than right nasal bones could indicate nondisplaced fracture.  Temporomandibular joints demonstrate degenerative change but are properly located. Paranasal sinuses are clear.  IMPRESSION: Possible nondisplaced nasal bone fractures.  Apparent soft tissue gas, possibly laceration, over the left anterior pre mandibular soft tissues but no underlying fracture.  Original Report Authenticated By: Harrel Lemon, M.D.   Dg Knee Complete 4 Views Left  09/03/2011  *RADIOLOGY REPORT*  Clinical Data: Fall, left inferior knee abrasion and pain  LEFT KNEE - COMPLETE 4+ VIEW  Comparison: None.  Findings: Evidence of tripartite left total knee arthroplasty noted.  No fracture or dislocation or evidence for hardware failure.  Trace suprapatellar fluid is noted.  Heterotopic bone formation adjacent to the lateral distal femoral condyle is incidentally noted and does not have the typical appearance for fracture.  IMPRESSION: No acute abnormality.  Probable heterotopic bone formation adjacent to the lateral distal  femoral condyle.  Correlation for point tenderness in this area is recommended to help differentiate from fracture.  Original Report Authenticated By: Harrel Lemon, M.D.   Dg Knee Complete 4 Views  Right  09/03/2011  *RADIOLOGY REPORT*  Clinical Data: Right knee pain  RIGHT KNEE - COMPLETE 4+ VIEW  Comparison: None.  Findings: Moderate tricompartmental degenerative change identified, most prominent at the medial compartment.  Trace suprapatellar fluid noted.  Mild left infrapatellar soft tissue swelling is noted.  No fracture or dislocation.  No radiopaque foreign body.  IMPRESSION: Moderate tricompartmental degenerative change and prepatellar soft tissue swelling but no focal acute osseous abnormality.  Original Report Authenticated By: Harrel Lemon, M.D.   Dg Finger Thumb Right  09/03/2011  *RADIOLOGY REPORT*  Clinical Data: Fall, right thumb pain  RIGHT THUMB 2+V  Comparison: None.  Findings: Mild to moderate interphalangeal degenerative changes are noted at the right thumb.  No fracture or dislocation.  No radiopaque foreign body.  IMPRESSION: Moderate interphalangeal degenerative changes without focal acute finding.  Original Report Authenticated By: Harrel Lemon, M.D.   Ct Maxillofacial Wo Cm  09/03/2011  *RADIOLOGY REPORT*  Clinical Data:  Fall, syncope, facial abrasions  CT HEAD WITHOUT CONTRAST CT MAXILLOFACIAL WITHOUT CONTRAST  Technique:  Multidetector CT imaging of the head and maxillofacial structures were performed using the standard protocol without intravenous contrast. Multiplanar CT image reconstructions of the maxillofacial structures were also generated.  Comparison:  Head CT 12/13/2008  CT HEAD  Findings: Minimal patchy periventricular white matter hypodensities likely indicate small vessel ischemic change. No acute hemorrhage, acute infarction, or mass lesion is identified.  No midline shift. No ventriculomegaly.  No skull fracture.  Orbits and paranasal sinuses are  intact.  IMPRESSION: No acute intracranial finding.  CT MAXILLOFACIAL  Findings:   Streak artifact from dental amalgam is noted.  Orbits are unremarkable.  Soft tissue gas is noted over the left anterior mandible which could indicate laceration, correlate clinically. Discontinuity of the left greater than right nasal bones could indicate nondisplaced fracture.  Temporomandibular joints demonstrate degenerative change but are properly located. Paranasal sinuses are clear.  IMPRESSION: Possible nondisplaced nasal bone fractures.  Apparent soft tissue gas, possibly laceration, over the left anterior pre mandibular soft tissues but no underlying fracture.  Original Report Authenticated By: Harrel Lemon, M.D.    Date: 09/03/2011  Rate: 85  Rhythm: normal sinus rhythm  QRS Axis: normal  Intervals: normal  ST/T Wave abnormalities: normal  Conduction Disutrbances:none  Narrative Interpretation:   Old EKG Reviewed: unchanged from 03/16/2010    1. Fall   2. Nasal fracture   3. Abrasion of face and extremities   4. Contusion of left knee   5. Contusion of right knee   6. Sprain of ankle, left   7. Contusion of elbow, left   8. Contusion of left wrist    New Prescriptions   HYDROCODONE-ACETAMINOPHEN (NORCO) 5-325 MG PER TABLET    Take 2 tablets by mouth every 6 (six) hours as needed for pain.   METHOCARBAMOL (ROBAXIN) 500 MG TABLET    Take 1 or 2 po Q 6hrs for pain   Plan discharge Devoria Albe, MD, FACEP    MDM          Ward Givens, MD 09/03/11 1723

## 2011-09-03 NOTE — ED Notes (Signed)
Pt reports sitting on wall to drink water and does not remember falling or tripping, just knows she hit her face and L arm on brick column and rolled down a hill. Skin tears to L elbow and bridge of nose. Generalized body pain from rolling down hill.

## 2011-09-12 ENCOUNTER — Encounter: Payer: Self-pay | Admitting: Family

## 2011-09-12 ENCOUNTER — Ambulatory Visit (HOSPITAL_COMMUNITY)
Admission: RE | Admit: 2011-09-12 | Discharge: 2011-09-12 | Disposition: A | Discharge status: Home / Self Care | Payer: Medicare HMO | Source: Ambulatory Visit | Attending: Oncology | Admitting: Oncology

## 2011-09-12 ENCOUNTER — Ambulatory Visit (HOSPITAL_BASED_OUTPATIENT_CLINIC_OR_DEPARTMENT_OTHER): Payer: Medicare HMO | Admitting: Family

## 2011-09-12 ENCOUNTER — Other Ambulatory Visit (HOSPITAL_BASED_OUTPATIENT_CLINIC_OR_DEPARTMENT_OTHER): Payer: Medicare HMO | Admitting: Lab

## 2011-09-12 ENCOUNTER — Telehealth: Payer: Self-pay | Admitting: Oncology

## 2011-09-12 VITALS — BP 148/80 | HR 76 | Temp 98.4°F | Ht 67.0 in | Wt 204.4 lb

## 2011-09-12 DIAGNOSIS — Z171 Estrogen receptor negative status [ER-]: Secondary | ICD-10-CM

## 2011-09-12 DIAGNOSIS — R600 Localized edema: Secondary | ICD-10-CM

## 2011-09-12 DIAGNOSIS — C50219 Malignant neoplasm of upper-inner quadrant of unspecified female breast: Secondary | ICD-10-CM

## 2011-09-12 DIAGNOSIS — M7989 Other specified soft tissue disorders: Secondary | ICD-10-CM | POA: Insufficient documentation

## 2011-09-12 DIAGNOSIS — Z1231 Encounter for screening mammogram for malignant neoplasm of breast: Secondary | ICD-10-CM

## 2011-09-12 DIAGNOSIS — R609 Edema, unspecified: Secondary | ICD-10-CM

## 2011-09-12 DIAGNOSIS — C50919 Malignant neoplasm of unspecified site of unspecified female breast: Secondary | ICD-10-CM

## 2011-09-12 LAB — CBC WITH DIFFERENTIAL/PLATELET
BASO%: 0.7 % (ref 0.0–2.0)
EOS%: 3.8 % (ref 0.0–7.0)
LYMPH%: 21.3 % (ref 14.0–49.7)
MCHC: 33.9 g/dL (ref 31.5–36.0)
MONO#: 0.3 10*3/uL (ref 0.1–0.9)
MONO%: 6.6 % (ref 0.0–14.0)
Platelets: 174 10*3/uL (ref 145–400)
RBC: 3.87 10*6/uL (ref 3.70–5.45)
WBC: 4.5 10*3/uL (ref 3.9–10.3)
nRBC: 0 % (ref 0–0)

## 2011-09-12 LAB — COMPREHENSIVE METABOLIC PANEL
ALT: 16 U/L (ref 0–35)
AST: 21 U/L (ref 0–37)
Alkaline Phosphatase: 90 U/L (ref 39–117)
Creatinine, Ser: 0.78 mg/dL (ref 0.50–1.10)
Total Bilirubin: 0.6 mg/dL (ref 0.3–1.2)

## 2011-09-12 NOTE — Telephone Encounter (Signed)
gve the pt her July 2013 appt calendar along with the mammo appt 

## 2011-09-12 NOTE — Progress Notes (Signed)
*  PRELIMINARY RESULTS* Vascular Ultrasound Right Lower Extremity Venous Duplex has been completed.  Preliminary findings: Right= No evidence of DVT or baker's cyst.  Farrel Demark, RDMS 09/12/2011, 4:44 PM

## 2011-09-12 NOTE — Progress Notes (Signed)
OFFICE PROGRESS NOTE [facility] SURVIVOR CLINIC EVALUATION  CC [name] Dr. Richarda Overlie Dr. Enriqueta Shutter, MD, MD [address] [place] [address]  DIAGNOSIS: 66 year old female with multifocal invasive ductal carcinoma of the left breast, status post left modified radical mastectomy with axillary lymph node dissection performed November 2011. The node dissection was negative for metastatic disease. ER negative PR negative HER-2/neu negative with a proliferation marker 18%.  PRIOR THERAPY: 1. Mastectomy, left breast with axillary lymph node dissection November 2011. Found to have multifocal disease. Had immediate reconstruction. 2. Received 4 cycles of adjuvant chemotherapy initially consisting of dose dense Adriamycin and Cytoxan from 04/25/2010 to 06/06/2010. 3. Received Taxol and carboplatinum from 06/21/2010 to 08/06/2010. Received a total of 7 cycles of combination, developed peripheral paresthesias and severe myelo-suppression. Carboplatin was discontinued and she went on to complete single agent Taxol from 08/20/2010 2 09/20/2010. Patient completed a total of 12 weeks of taxol containing regimen 4. Reconstruction by Dr. Etter Sjogren.  REASON FOR VISIT: Establish care in the Breast Cancer Survivor Clinic.  HISTORY OF PRESENT ILLNESS: Here for routine breast cancer surveillance, no immediate or urgent physical concerns. No self-detected breast complaints. Last right mammogram July, 2012.   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 Was assessed in the ER and released. Continues to have increasing pain, redness, warmth and edema of right lower extremity, worse below the knee. Extensive bruising around the knee and ankle.   PAST MEDICAL HISTORY:  Past Medical History  Diagnosis Date  . Hypertension   . Hyperlipidemia   . GERD  (gastroesophageal reflux disease)   . Asthma   . Arthritis   . Colon polyps   . Compression fx, lumbar spine     l2  . Osteoporosis   . Hematuria   . Anxiety   . Peptic stricture of esophagus   . Hemorrhoids   . Cancer     Breast, sees Dr. Dara Lords    ALLERGIES:  Allergies as of 09/12/2011 - Review Complete 09/12/2011  Allergen Reaction Noted  . Dust mite extract Other (See Comments) 05/03/2011  . Mold extract (trichophyton mentagrophyte) Other (See Comments) 05/03/2011  . Other  06/19/2011   MEDICATIONS:  Current Outpatient Prescriptions  Medication Sig Dispense Refill  . acyclovir (ZOVIRAX) 400 MG tablet Take 400 mg by mouth every 8 (eight) hours as needed. Cold sore.      Marland Kitchen albuterol (PROVENTIL HFA;VENTOLIN HFA) 108 (90 BASE) MCG/ACT inhaler Inhale 1 puff into the lungs every 4 (four) hours as needed. Wheezing/shortness of breath.      . ALPRAZolam (XANAX) 0.25 MG tablet Take 0.25 mg by mouth 3 (three) times daily as needed. Anxiety.      . celecoxib (CELEBREX) 200 MG capsule Take 200 mg by mouth daily.      . enalapril-hydrochlorothiazide (VASERETIC) 10-25 MG per tablet Take 1 tablet by mouth daily.  30 tablet  11  . HYDROcodone-acetaminophen (NORCO) 5-325 MG per tablet Take 2 tablets by mouth every 6 (six) hours as needed for pain.  12 tablet  0  . methocarbamol (ROBAXIN) 500 MG tablet Take 1 or 2 po Q 6hrs for pain  60 tablet  0  . Multiple Vitamins-Minerals (CENTRUM ULTRA WOMENS) TABS Take 1 tablet by mouth daily.        Marland Kitchen NEXIUM 40 MG capsule TAKE 1 CAPSULE EVERY DAY  30  capsule  10  . potassium chloride (KLOR-CON 10) 10 MEQ CR tablet Take 1 tablet (10 mEq total) by mouth daily.  30 tablet  11  . sertraline (ZOLOFT) 100 MG tablet Take 1 tablet (100 mg total) by mouth daily.  30 tablet  11  . simvastatin (ZOCOR) 40 MG tablet Take 1 tablet (40 mg total) by mouth at bedtime.  30 tablet  11  . DISCONTD: albuterol (PROVENTIL HFA) 108 (90 BASE) MCG/ACT inhaler Inhale 2 puffs  into the lungs every 4 (four) hours as needed for wheezing.  1 Inhaler  11   FAMILY HISTORY:  Family History  Problem Relation Age of Onset  . Arthritis    . Hypertension    . Cancer    . Stroke    . Heart disease    . Heart disease Paternal Grandmother    SELF-ASSESSMENT CONCERNS: Physical: Numbness/tingling, left foot; We discuss pharmacologic interventions for paresthesias and she is unwilling to accept at this time. States it is mild, and does not want it addressed at present.       Body Image; is uncomfortable with the appearance of her reconstructed breast, stating the "it doesn't match the other one". I explain that it is very difficult to reconstruct a native-appearing breast, and reassure her that the cosmetic result      is acceptable.  She is hesitant to engage in intimacy because due to the appearance of her breasts. Also voices concerns that her hair is not coming back as thick, or as quickly as she had hoped. Worries that intimacy would be difficult       while wearing a wig. I encourage her to consider dispensing with the wig and accepting her natural hair.  Spiritual/Religious: None Practical: None Family/Relationship: None Emotional: None Lifestyle or Information Needs: Diet and nutrition; we discuss Weight Watchers and healthy eating. She is very active in her work, and goes to the gym 4 days/week.    REVIEW OF SYSTEMS: General: Negative for fever, chills, night sweats,  loss of appetite or weight loss. HEENT: Negative for headaches, sore  throat, difficulty swallowing, blurred vision or problem with hearing or  sinus congestion. Respiratory: Negative for shortness of breath, cough  or dyspnea on exertion. Cardiovascular: Negative for chest pain,  palpitations or pedal edema. GI: Negative for nausea, vomiting,  diarrhea, constipation, change in bowel habits or blood in the stool.  No jaundice. GU: Negative for painful or frequent urination, change in  color of urine,  or decreased urinary stream. Integumentary: Negative  for skin rashes or other suspicious skin lesions. Hematologic: Negative  for easy bruisability or bleeding. Musculoskeletal: Negative for  complaints of pain, arthralgias, arthritis or myalgias.  Neurological/psychiatric: Negative for numbness, focal weakness,  balance problems or coordination difficulties. No depression or anxiety. Breast: No self-detected breast complaints.   PHYSICAL EXAM: There were no vitals taken for this visit. GENERAL: Well developed, well nourished, in no acute distress.  EENT: No ocular or oral lesions. No stomatitis.  RESPIRATORY: Lungs are clear to auscultation bilaterally with normal respiratory movement and no accessory muscle use. CARDIAC: No murmur, rub or tachycardia. No upper or lower extremity edema.  GI: Abdomen is soft, no palpable hepatosplenomegaly. No fluid wave. No tenderness. Musculoskeletal: No kyphosis, no tenderness over the spine, ribs or hips. Lymph: No cervical, infraclavicular, axillary or inguinal adenopathy. Breast: In the supine position, with the right arm over the head, the right nipple is everted. No periareolar edema or nipple discharge.  No mass in any quadrant or subareolar region. No redness of the skin. No right axillary adenopathy. With the left arm over the head, the left nipple is everted. No periareolar edema or nipple discharge. No mass in any quadrant or subareolar region. No redness of the skin. No left axillary adenopathy. Neuro: No focal neurological deficits. Psych: Alert and oriented X 3, appropriate mood and affect.   LABORATORY STUDIES:  Results for orders placed in visit on 09/12/11  CBC WITH DIFFERENTIAL      Component Value Range   WBC 4.5  3.9 - 10.3 (10e3/uL)   NEUT# 3.1  1.5 - 6.5 (10e3/uL)   HGB 11.4 (*) 11.6 - 15.9 (g/dL)   HCT 96.2 (*) 95.2 - 46.6 (%)   Platelets 174  145 - 400 (10e3/uL)   MCV 87.1  79.5 - 101.0 (fL)   MCH 29.5  25.1 - 34.0 (pg)    MCHC 33.9  31.5 - 36.0 (g/dL)   RBC 8.41  3.24 - 4.01 (10e6/uL)   RDW 14.1  11.2 - 14.5 (%)   lymph# 1.0  0.9 - 3.3 (10e3/uL)   MONO# 0.3  0.1 - 0.9 (10e3/uL)   Eosinophils Absolute 0.2  0.0 - 0.5 (10e3/uL)   Basophils Absolute 0.0  0.0 - 0.1 (10e3/uL)   NEUT% 67.6  38.4 - 76.8 (%)   LYMPH% 21.3  14.0 - 49.7 (%)   MONO% 6.6  0.0 - 14.0 (%)   EOS% 3.8  0.0 - 7.0 (%)   BASO% 0.7  0.0 - 2.0 (%)   nRBC 0  0 - 0 (%)    LAST BREAST IMAGING: 12/03/2010, right breast, no evidence of malignancy.    TEACHING: I addressed the need to screen for other cancers (skin, colon). We reviewed the need for self breast exam and instruction was given. I reiterated symptoms to report and when to call the clinic. Risk reduction strategies per NCCN guidelines were reviewed.   IMPRESSION: 1. History left breast cancer Nov. 2011, no evidence of recurrence.  2. Body image issues related to asymmetry of breasts.  3. Recent fall with trauma to right leg. Right lower leg edema and pain.  4. Due for mammo July.    PLAN: 1. Keep regularly scheduled appt with Dr. Welton Flakes July, 2013.  2. Doppler, right leg ASAP. 3. Mammogram, right breast, in July, prior to visit with Dr. Welton Flakes.   DISCUSSION: A total of 45 was spent with the patient. More than 30 minutes were spent on counseling regarding cancer survivor issues and coordination of follow-up care. NCCN guidelines for follow-up care were reviewed and a surveillance plan was outlined. The importance of compliance was stressed.

## 2011-09-24 ENCOUNTER — Ambulatory Visit (INDEPENDENT_AMBULATORY_CARE_PROVIDER_SITE_OTHER): Payer: Medicare HMO | Admitting: General Surgery

## 2011-09-24 ENCOUNTER — Encounter (INDEPENDENT_AMBULATORY_CARE_PROVIDER_SITE_OTHER): Payer: Self-pay | Admitting: General Surgery

## 2011-09-24 VITALS — BP 135/79 | HR 85 | Temp 97.4°F | Ht 67.0 in | Wt 200.4 lb

## 2011-09-24 DIAGNOSIS — Z853 Personal history of malignant neoplasm of breast: Secondary | ICD-10-CM

## 2011-09-24 NOTE — Patient Instructions (Signed)
Call if you find any masses.

## 2011-09-24 NOTE — Progress Notes (Signed)
Operation:  Left mastectomy and reconstruction  Date:  November 2011   Hormone receptor status: Negative  HPI: She is here for followup of her left breast cancer. Mammogram September 2012 of the right breast was negative. She denies any new masses or adenopathy, although she does not check herself often. She had some injuries from a recent fall and has recovered from this.  PE: Rebekah Moreno looks well and is in no acute distress  Right breast-no visible or palpable abnormalities.  Left breast-she has a reconstructed nipple and her breast reconstruction looks good.. No nodules around the incision or native skin.  Lymph nodes-no palpable cervical, supraclavicular, or axillary adenopathy.    Assessment:  Left breast cancer-no clinical evidence of recurrence.  Plan: Return visit 3 months.

## 2011-11-06 ENCOUNTER — Telehealth: Payer: Self-pay | Admitting: Family Medicine

## 2011-11-06 MED ORDER — ALPRAZOLAM 0.25 MG PO TABS: 0.2500 mg | ORAL_TABLET | Freq: Three times a day (TID) | ORAL | Status: DC | PRN

## 2011-11-06 NOTE — Telephone Encounter (Signed)
Call in #[phone] with 5 rf 

## 2011-11-06 NOTE — Telephone Encounter (Signed)
I called in script 

## 2011-11-06 NOTE — Telephone Encounter (Signed)
Refill request for Alprazolam 0.25 mg take 1 po tid prn and pt last here on 08/13/11.

## 2011-12-11 ENCOUNTER — Encounter (INDEPENDENT_AMBULATORY_CARE_PROVIDER_SITE_OTHER): Payer: Self-pay | Admitting: General Surgery

## 2011-12-11 ENCOUNTER — Ambulatory Visit (INDEPENDENT_AMBULATORY_CARE_PROVIDER_SITE_OTHER): Payer: Medicare HMO | Admitting: General Surgery

## 2011-12-11 VITALS — BP 132/70 | HR 64 | Temp 97.6°F | Resp 14 | Ht 67.0 in | Wt 206.4 lb

## 2011-12-11 DIAGNOSIS — Z853 Personal history of malignant neoplasm of breast: Secondary | ICD-10-CM

## 2011-12-11 NOTE — Progress Notes (Signed)
Operation:  Left mastectomy and reconstruction  Date:  November 2011   Hormone receptor status: Negative  HPI: She is here for followup of her left breast cancer. Mammogram is due this September 2012 of the right breast.  She noticed some dried fluid on her night clothes in the region of the right breast recently which she has not had before.  She denies any new masses or adenopathy, although she does not check herself often.   PE: Rebekah Moreno looks well and is in no acute distress  Right breast-no visible or palpable abnormalities; I am able to elicit some clear nipple discharge from the upper ducts with aggressive manipulation of the area.  Left breast-she has a reconstructed nipple and her breast reconstruction looks good.. No nodules around the incision or native skin.  Lymph nodes-no palpable cervical, supraclavicular, or axillary adenopathy.    Assessment:  Left breast cancer-no clinical evidence of recurrence.  Clear right nipple discharge with significant manipulation that is likely benign.  Plan: Return visit 3 months.  Further evaluation of right nipple discharge if it becomes more frequent or bloody.

## 2011-12-11 NOTE — Patient Instructions (Signed)
Call if the nipple discharge becomes more frequent or bloody.

## 2011-12-17 ENCOUNTER — Other Ambulatory Visit: Payer: Self-pay | Admitting: Medical Oncology

## 2011-12-17 DIAGNOSIS — D126 Benign neoplasm of colon, unspecified: Secondary | ICD-10-CM

## 2011-12-18 ENCOUNTER — Other Ambulatory Visit (HOSPITAL_BASED_OUTPATIENT_CLINIC_OR_DEPARTMENT_OTHER): Payer: Medicare HMO | Admitting: Lab

## 2011-12-18 ENCOUNTER — Encounter: Payer: Self-pay | Admitting: Oncology

## 2011-12-18 ENCOUNTER — Ambulatory Visit (HOSPITAL_BASED_OUTPATIENT_CLINIC_OR_DEPARTMENT_OTHER): Payer: Medicare HMO | Admitting: Oncology

## 2011-12-18 ENCOUNTER — Telehealth: Payer: Self-pay | Admitting: *Deleted

## 2011-12-18 ENCOUNTER — Ambulatory Visit (HOSPITAL_COMMUNITY)
Admission: RE | Admit: 2011-12-18 | Discharge: 2011-12-18 | Disposition: A | Discharge status: Home / Self Care | Payer: Medicare HMO | Source: Ambulatory Visit | Attending: Oncology | Admitting: Oncology

## 2011-12-18 VITALS — BP 134/79 | HR 103 | Temp 98.5°F | Ht 67.0 in | Wt 205.3 lb

## 2011-12-18 DIAGNOSIS — C50219 Malignant neoplasm of upper-inner quadrant of unspecified female breast: Secondary | ICD-10-CM

## 2011-12-18 DIAGNOSIS — R0602 Shortness of breath: Secondary | ICD-10-CM | POA: Insufficient documentation

## 2011-12-18 DIAGNOSIS — F341 Dysthymic disorder: Secondary | ICD-10-CM

## 2011-12-18 DIAGNOSIS — D126 Benign neoplasm of colon, unspecified: Secondary | ICD-10-CM

## 2011-12-18 DIAGNOSIS — Z853 Personal history of malignant neoplasm of breast: Secondary | ICD-10-CM

## 2011-12-18 DIAGNOSIS — I10 Essential (primary) hypertension: Secondary | ICD-10-CM

## 2011-12-18 DIAGNOSIS — Z9010 Acquired absence of unspecified breast and nipple: Secondary | ICD-10-CM | POA: Insufficient documentation

## 2011-12-18 DIAGNOSIS — Z171 Estrogen receptor negative status [ER-]: Secondary | ICD-10-CM

## 2011-12-18 LAB — CBC WITH DIFFERENTIAL/PLATELET
Basophils Absolute: 0 10*3/uL (ref 0.0–0.1)
Eosinophils Absolute: 0.2 10*3/uL (ref 0.0–0.5)
HCT: 36.7 % (ref 34.8–46.6)
HGB: 12.3 g/dL (ref 11.6–15.9)
LYMPH%: 17.1 % (ref 14.0–49.7)
MCV: 86.1 fL (ref 79.5–101.0)
MONO#: 0.3 10*3/uL (ref 0.1–0.9)
MONO%: 7.4 % (ref 0.0–14.0)
NEUT#: 3 10*3/uL (ref 1.5–6.5)
NEUT%: 71.3 % (ref 38.4–76.8)
Platelets: 176 10*3/uL (ref 145–400)
WBC: 4.2 10*3/uL (ref 3.9–10.3)

## 2011-12-18 LAB — COMPREHENSIVE METABOLIC PANEL
BUN: 20 mg/dL (ref 6–23)
CO2: 29 mEq/L (ref 19–32)
Glucose, Bld: 76 mg/dL (ref 70–99)
Sodium: 142 mEq/L (ref 135–145)
Total Bilirubin: 0.5 mg/dL (ref 0.3–1.2)
Total Protein: 6.3 g/dL (ref 6.0–8.3)

## 2011-12-18 NOTE — Telephone Encounter (Signed)
Per MD, notified pt Chest Xray ok. Pt verbalized understanding.

## 2011-12-18 NOTE — Patient Instructions (Addendum)
Chest X-ray  Follow up in 4 months (December 2013)

## 2011-12-18 NOTE — Telephone Encounter (Signed)
Message copied by Cooper Render on Wed Dec 18, 2011  4:27 PM ------      Message from: Victorino December      Created: Wed Dec 18, 2011  4:25 PM       Call patient: chest x-ray ok

## 2011-12-18 NOTE — Telephone Encounter (Signed)
Gave patient appointment for 05-06-2012 starting at 10:00am gave patient instructions on getting her chest x-ray in the [facility] as an walk in

## 2011-12-18 NOTE — Progress Notes (Signed)
OFFICE PROGRESS NOTE  CC Rebekah Moreno Dr. Richarda Overlie Dr. Enriqueta Shutter, MD [address] [place] [address]  DIAGNOSIS: 66 year old female with multifocal invasive ductal carcinoma of the left breast status post left modified radical mastectomy with axillary lymph node dissection performed in November 2011. The tumor was ER negative PR negative HER-2/neu negative with a proliferation marker 18%  PRIOR THERAPY:  #1 patient underwent a mastectomy of the left breast with axillary lymph node dissection November 2011. She was found to have multifocal disease. The node dissection was negative for metastatic disease. Tumor was ER negative PR negative HER-2/neu negative proliferation marker 18%. She had immediate reconstruction.  #2 patient then received 4 cycles of adjuvant chemotherapy initially consisting of dose dense Adriamycin and Cytoxan from 04/25/2010 to 06/06/2010.  #3 she then received Taxol and carboplatinum from 06/21/2010 to 08/06/2010. She received a total of 7 cycles of combination. Patient developed peripheral paresthesias and severe myelo suppression. Thus carboplatin was discontinued and she went on to complete single agent Taxol from 08/20/2010 2 09/20/2010. Patient completed a total of 12 weeks of taxane containing regimen  #3 patient has undergone reconstruction by Dr. Etter Sjogren.  CURRENT THERAPY:Observation  INTERVAL HISTORY: Rebekah Moreno 66 y.o. female returns for Followup visit today. Clinically an oncological he she is doing well.She is working full-time she does complain of some fatigue. She has had a little bit of residual neuropathy off-and-on. But nothing consistent. She denies any headaches double vision blurring of vision no fevers chills or night sweats. She has no bleeding no chest pains no shortness of breath. She does complain of fatigue. We discussed exercise today. Remainder of the template review of systems is  negative. MEDICAL HISTORY: Past Medical History  Diagnosis Date  . Hypertension   . Hyperlipidemia   . GERD (gastroesophageal reflux disease)   . Asthma   . Arthritis   . Colon polyps   . Compression fx, lumbar spine     l2  . Osteoporosis   . Hematuria   . Anxiety   . Peptic stricture of esophagus   . Hemorrhoids   . Cancer     Breast, sees Dr. Dara Lords     ALLERGIES:  is allergic to dust mite extract; mold extract; and other.  MEDICATIONS:  Current Outpatient Prescriptions  Medication Sig Dispense Refill  . acyclovir (ZOVIRAX) 400 MG tablet Take 400 mg by mouth as needed. Cold sore.      Marland Kitchen albuterol (PROVENTIL HFA;VENTOLIN HFA) 108 (90 BASE) MCG/ACT inhaler Inhale 1 puff into the lungs every 4 (four) hours as needed. Wheezing/shortness of breath.      . ALPRAZolam (XANAX) 0.25 MG tablet Take 1 tablet (0.25 mg total) by mouth 3 (three) times daily as needed. Anxiety.  90 tablet  5  . B Complex-Biotin-FA (B-COMPLEX PO) Take by mouth daily.      . celecoxib (CELEBREX) 200 MG capsule Take 200 mg by mouth daily.      . enalapril-hydrochlorothiazide (VASERETIC) 10-25 MG per tablet Take 1 tablet by mouth daily.  30 tablet  11  . Multiple Vitamins-Minerals (CENTRUM ULTRA WOMENS) TABS Take 1 tablet by mouth daily.        Marland Kitchen NEXIUM 40 MG capsule TAKE 1 CAPSULE EVERY DAY  30 capsule  10  . potassium chloride (KLOR-CON 10) 10 MEQ CR tablet Take 1 tablet (10 mEq total) by mouth daily.  30 tablet  11  . sertraline (ZOLOFT) 100 MG tablet  Take 1 tablet (100 mg total) by mouth daily.  30 tablet  11  . simvastatin (ZOCOR) 40 MG tablet Take 1 tablet (40 mg total) by mouth at bedtime.  30 tablet  11  . DISCONTD: albuterol (PROVENTIL HFA) 108 (90 BASE) MCG/ACT inhaler Inhale 2 puffs into the lungs every 4 (four) hours as needed for wheezing.  1 Inhaler  11    SURGICAL HISTORY:  Past Surgical History  Procedure Date  . Knee arthroscopy     left  . Joint replacement     left knee  . Left  knee arthroscopy  02/2001  . Total knee arthroplasty left 12/2--3    dr s Livingston Diones dean  . L3,l4 discectomy 11/2000    per Dr. Gerlene Fee   . Rhinoplasty     dr Jearld Fenton  . Hemorrhoid surgery   . Mastectomy 11/11    left breast  . Portacath placement   . Colonoscopy 11-10-07    per Dr. Russella Dar, int and ext hemorrhoids, repeat in 5 yrs   . Port-a-cath removal   . Breast surgery     left mastectomy and reconstruction    REVIEW OF SYSTEMS:  Pertinent items are noted in HPI.   PHYSICAL EXAMINATION: General appearance: alert, cooperative and appears stated age Head: Normocephalic, without obvious abnormality, atraumatic Neck: no adenopathy, no carotid bruit, no JVD, supple, symmetrical, trachea midline and thyroid not enlarged, symmetric, no tenderness/mass/nodules Lymph nodes: Cervical, supraclavicular, and axillary nodes normal. Resp: clear to auscultation bilaterally and normal percussion bilaterally Back: symmetric, no curvature. ROM normal. No CVA tenderness. Cardio: regular rate and rhythm, S1, S2 normal, no murmur, click, rub or gallop and normal apical impulse GI: soft, non-tender; bowel sounds normal; no masses,  no organomegaly Extremities: extremities normal, atraumatic, no cyanosis or edema Neurologic: Alert and oriented X 3, normal strength and tone. Normal symmetric reflexes. Normal coordination and gait Breast examination right breast no masses temple discharge no nipple retraction. Left reconstructed breast looks well healed there is no overlying skin changes she does have a reconstructed nipple present ECOG PERFORMANCE STATUS: 0 - Asymptomatic  Blood pressure 134/79, pulse 103, temperature 98.5 F (36.9 C), temperature source Oral, height 5\' 7"  (1.702 m), weight 205 lb 4.8 oz (93.123 kg).  LABORATORY DATA: Lab Results  Component Value Date   WBC 4.2 12/18/2011   HGB 12.3 12/18/2011   HCT 36.7 12/18/2011   MCV 86.1 12/18/2011   PLT 176 12/18/2011      Chemistry      Component  Value Date/Time   NA 140 09/12/2011 1404   K 4.1 09/12/2011 1404   CL 103 09/12/2011 1404   CO2 29 09/12/2011 1404   BUN 22 09/12/2011 1404   CREATININE 0.78 09/12/2011 1404      Component Value Date/Time   CALCIUM 9.5 09/12/2011 1404   ALKPHOS 90 09/12/2011 1404   AST 21 09/12/2011 1404   ALT 16 09/12/2011 1404   BILITOT 0.6 09/12/2011 1404       RADIOGRAPHIC STUDIES:  No results found.  ASSESSMENT: 66 year old female with  #1 multifocal invasive ductal carcinoma of the left breast status post modified radical mastectomy with axillary lymph node dissection performed in November 2011. Her final pathology showed a triple negative disease.She is status post adjuvant chemotherapy consisting of initially Adriamycin Cytoxan followed by Taxol carboplatinum.  #2 she has a reconstructed left breast.  #3 bilateral arthritis of the knees.  #4 depression and anxiety.  #5 hypercholesterolemia  #6 hypertension  PLAN:   #1 overall patient is doing well she has no evidence of recurrent disease. She is up to date on her mammograms.  #2 I will plan on seeing her back in 6 months time.  #3 exercise and diet are discussed. And stress reduction.   All questions were answered. The patient knows to call the clinic with any problems, questions or concerns. We can certainly see the patient much sooner if necessary.  I spent 20 minutes counseling the patient face to face. The total time spent in the appointment was 30 minutes.    Drue Second, MD Medical/Oncology [facility] [phone] (beeper) [phone] (Office)  12/18/2011, 11:06 AM

## 2011-12-20 ENCOUNTER — Telehealth: Payer: Self-pay | Admitting: Medical Oncology

## 2011-12-20 NOTE — Telephone Encounter (Signed)
Message copied by Tylene Fantasia on Fri Dec 20, 2011  9:37 AM ------      Message from: Victorino December      Created: Thu Dec 19, 2011  5:25 PM       Please call patient: blood work normal

## 2011-12-20 NOTE — Telephone Encounter (Signed)
LMOVM, per MD, blood work looked normal.  Instructed patient to call with any questions or concerns

## 2012-01-18 ENCOUNTER — Other Ambulatory Visit: Payer: Self-pay | Admitting: Family Medicine

## 2012-01-30 ENCOUNTER — Ambulatory Visit
Admission: RE | Admit: 2012-01-30 | Discharge: 2012-01-30 | Disposition: A | Discharge status: Home / Self Care | Payer: Medicare HMO | Source: Ambulatory Visit | Attending: Family | Admitting: Family

## 2012-01-30 DIAGNOSIS — Z1231 Encounter for screening mammogram for malignant neoplasm of breast: Secondary | ICD-10-CM

## 2012-03-07 ENCOUNTER — Other Ambulatory Visit: Payer: Self-pay | Admitting: Family Medicine

## 2012-03-10 ENCOUNTER — Ambulatory Visit (INDEPENDENT_AMBULATORY_CARE_PROVIDER_SITE_OTHER): Payer: Medicare HMO | Admitting: General Surgery

## 2012-03-10 ENCOUNTER — Encounter (INDEPENDENT_AMBULATORY_CARE_PROVIDER_SITE_OTHER): Payer: Self-pay | Admitting: General Surgery

## 2012-03-10 VITALS — BP 134/80 | HR 74 | Temp 98.2°F | Resp 18 | Ht 67.0 in | Wt 208.2 lb

## 2012-03-10 DIAGNOSIS — Z853 Personal history of malignant neoplasm of breast: Secondary | ICD-10-CM

## 2012-03-10 NOTE — Progress Notes (Signed)
Operation:  Left mastectomy and reconstruction  Date:  November 2011  Final pathology: T1N0   Hormone receptor status: Negative  HPI: She is here for followup of her left breast cancer. She is no longer having right nipple discharge.  She denies left chest wall or right breast masses.  No adenopathy. PE: Rebekah Moreno looks well and is in no acute distress  Right breast-no visible or palpable abnormalities.  Left breast-she has a reconstructed nipple and her breast reconstruction looks good.. No nodules around the incision or native skin.  Lymph nodes-no palpable cervical, supraclavicular, or axillary adenopathy.  Assessment:  Left breast cancer-no clinical evidence of recurrence.  Nipple discharge has resolved.  Plan: Return visit 6 months.

## 2012-03-10 NOTE — Patient Instructions (Addendum)
Call if you feel any masses in your chest or breast.  You had stage 1 breast cancer.  The chances of it coming back are low.

## 2012-03-17 ENCOUNTER — Encounter (HOSPITAL_COMMUNITY): Payer: Self-pay | Admitting: *Deleted

## 2012-03-17 ENCOUNTER — Emergency Department (HOSPITAL_COMMUNITY): Payer: Medicare HMO

## 2012-03-17 ENCOUNTER — Emergency Department (HOSPITAL_COMMUNITY)
Admission: EM | Admit: 2012-03-17 | Discharge: 2012-03-17 | Disposition: A | Discharge status: Home / Self Care | Payer: Medicare HMO | Attending: Emergency Medicine | Admitting: Emergency Medicine

## 2012-03-17 DIAGNOSIS — W010XXA Fall on same level from slipping, tripping and stumbling without subsequent striking against object, initial encounter: Secondary | ICD-10-CM | POA: Insufficient documentation

## 2012-03-17 DIAGNOSIS — Z853 Personal history of malignant neoplasm of breast: Secondary | ICD-10-CM | POA: Insufficient documentation

## 2012-03-17 DIAGNOSIS — J45909 Unspecified asthma, uncomplicated: Secondary | ICD-10-CM | POA: Insufficient documentation

## 2012-03-17 DIAGNOSIS — E785 Hyperlipidemia, unspecified: Secondary | ICD-10-CM | POA: Insufficient documentation

## 2012-03-17 DIAGNOSIS — S79919A Unspecified injury of unspecified hip, initial encounter: Secondary | ICD-10-CM | POA: Insufficient documentation

## 2012-03-17 DIAGNOSIS — F411 Generalized anxiety disorder: Secondary | ICD-10-CM | POA: Insufficient documentation

## 2012-03-17 DIAGNOSIS — M810 Age-related osteoporosis without current pathological fracture: Secondary | ICD-10-CM | POA: Insufficient documentation

## 2012-03-17 DIAGNOSIS — W19XXXA Unspecified fall, initial encounter: Secondary | ICD-10-CM

## 2012-03-17 DIAGNOSIS — M129 Arthropathy, unspecified: Secondary | ICD-10-CM | POA: Insufficient documentation

## 2012-03-17 DIAGNOSIS — S20219A Contusion of unspecified front wall of thorax, initial encounter: Secondary | ICD-10-CM

## 2012-03-17 DIAGNOSIS — K219 Gastro-esophageal reflux disease without esophagitis: Secondary | ICD-10-CM | POA: Insufficient documentation

## 2012-03-17 DIAGNOSIS — Y9269 Other specified industrial and construction area as the place of occurrence of the external cause: Secondary | ICD-10-CM | POA: Insufficient documentation

## 2012-03-17 DIAGNOSIS — I10 Essential (primary) hypertension: Secondary | ICD-10-CM | POA: Insufficient documentation

## 2012-03-17 DIAGNOSIS — S79929A Unspecified injury of unspecified thigh, initial encounter: Secondary | ICD-10-CM | POA: Insufficient documentation

## 2012-03-17 DIAGNOSIS — Z8719 Personal history of other diseases of the digestive system: Secondary | ICD-10-CM | POA: Insufficient documentation

## 2012-03-17 DIAGNOSIS — Y9301 Activity, walking, marching and hiking: Secondary | ICD-10-CM | POA: Insufficient documentation

## 2012-03-17 LAB — CBC WITH DIFFERENTIAL/PLATELET
Basophils Absolute: 0 10*3/uL (ref 0.0–0.1)
Basophils Relative: 0 % (ref 0–1)
Eosinophils Absolute: 0.2 10*3/uL (ref 0.0–0.7)
MCHC: 32.7 g/dL (ref 30.0–36.0)
Neutro Abs: 9.9 10*3/uL — ABNORMAL HIGH (ref 1.7–7.7)
Neutrophils Relative %: 83 % — ABNORMAL HIGH (ref 43–77)
RDW: 13.6 % (ref 11.5–15.5)

## 2012-03-17 LAB — COMPREHENSIVE METABOLIC PANEL
ALT: 23 U/L (ref 0–35)
Alkaline Phosphatase: 98 U/L (ref 39–117)
CO2: 28 mEq/L (ref 19–32)
Chloride: 97 mEq/L (ref 96–112)
GFR calc Af Amer: >90 mL/min (ref >90)
GFR calc non Af Amer: 88 mL/min — ABNORMAL LOW (ref >90)
Glucose, Bld: 138 mg/dL — ABNORMAL HIGH (ref 70–99)
Potassium: 4.2 mEq/L (ref 3.5–5.1)
Sodium: 135 mEq/L (ref 135–145)
Total Bilirubin: 0.4 mg/dL (ref 0.3–1.2)

## 2012-03-17 MED ORDER — HYDROMORPHONE HCL PF 1 MG/ML IJ SOLN
1.0000 mg | Freq: Once | INTRAMUSCULAR | Status: AC
  Administered 2012-03-17: 1 mg via INTRAVENOUS
  Filled 2012-03-17: qty 1

## 2012-03-17 MED ORDER — OXYCODONE-ACETAMINOPHEN 5-325 MG PO TABS: 1.0000 | ORAL_TABLET | ORAL | Status: DC | PRN

## 2012-03-17 MED ORDER — IBUPROFEN 600 MG PO TABS: 600.0000 mg | ORAL_TABLET | Freq: Three times a day (TID) | ORAL | Status: DC | PRN

## 2012-03-17 MED ORDER — OXYCODONE-ACETAMINOPHEN 5-325 MG PO TABS
1.0000 | ORAL_TABLET | Freq: Once | ORAL | Status: AC
  Administered 2012-03-17: 1 via ORAL
  Filled 2012-03-17: qty 1

## 2012-03-17 NOTE — ED Notes (Signed)
Pt slipped fell down seven stairs; landing on back; hitting head; neg loc; c collar placed in triage; neck pain; shoulders/back pain; hip pain; abrasion/bruising bilateral hands; knot to top and back of head

## 2012-03-17 NOTE — ED Provider Notes (Signed)
History     CSN: [id number]  Arrival date & time 03/17/12  8469   First MD Initiated Contact with Patient 03/17/12 2029      Chief Complaint  Patient presents with  . Fall     The history is provided by the patient.   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  She spun around landed on her left side.  She reports mild left hip pain as well as left lateral chest pain.  She's mild neck pain.  She denies weakness of her upper lower extremities.  She has no abdominal pain.  She is not on anticoagulants.  Family reports she is acting normal at this time.  Her pain is moderate to severe and is mainly located in her left lateral chest it is worse with movement and palpation.  She is very mild neck pain.  She's been ambulatory since the event.  She walked in to the emergency department.  Past Medical History  Diagnosis Date  . Hypertension   . Hyperlipidemia   . GERD (gastroesophageal reflux disease)   . Asthma   . Arthritis   . Colon polyps   . Compression fx, lumbar spine     l2  . Osteoporosis   . Hematuria   . Anxiety   . Peptic stricture of esophagus   . Hemorrhoids   . Cancer     Breast, sees Dr. Dara Lords     Past Surgical History  Procedure Date  . Knee arthroscopy     left  . Joint replacement     left knee  . Left knee arthroscopy  02/2001  . Total knee arthroplasty left 12/2--3    dr s [place] Diones dean  . L3,l4 discectomy 11/2000    per Dr. Gerlene Fee   . Rhinoplasty     dr Jearld Fenton  . Hemorrhoid surgery   . Mastectomy 11/11    left breast  . Portacath placement   . Colonoscopy 11-10-07    per Dr. Russella Dar, int and ext hemorrhoids, repeat in 5 yrs   . Port-a-cath removal   . Breast surgery     left mastectomy and reconstruction    Family History  Problem Relation Age of Onset  . Arthritis    . Hypertension    . Cancer    . Stroke    . Heart disease    . Heart disease Paternal Grandmother      History  Substance Use Topics  . Smoking status: Never Smoker   . Smokeless tobacco: Never Used  . Alcohol Use: 0.5 oz/week    1 drink(s) per week    OB History    Grav Para Term Preterm Abortions TAB SAB Ect Mult Living                  Review of Systems  All other systems reviewed and are negative.    Allergies  Dust mite extract; Mold extract; and Other  Home Medications   Current Outpatient Rx  Name Route Sig Dispense Refill  . ALBUTEROL SULFATE HFA 108 (90 BASE) MCG/ACT IN AERS Inhalation Inhale 1 puff into the lungs every 4 (four) hours as needed. Wheezing/shortness of breath.    . ALPRAZOLAM 0.25 MG PO TABS Oral Take 0.25 mg by mouth 3 (three) times daily as needed. Anxiety.    . B-COMPLEX PO Oral Take by mouth daily.    . CELECOXIB 200 MG  PO CAPS Oral Take 200 mg by mouth daily.    . ENALAPRIL-HYDROCHLOROTHIAZIDE 10-25 MG PO TABS Oral Take 1 tablet by mouth daily.    Marland Kitchen ESOMEPRAZOLE MAGNESIUM 40 MG PO CPDR Oral Take 40 mg by mouth daily before breakfast.    . IBUPROFEN 200 MG PO TABS Oral Take 200 mg by mouth every 6 (six) hours as needed. Pain    . CENTRUM ULTRA WOMENS PO TABS Oral Take 1 tablet by mouth daily.      Marland Kitchen POTASSIUM CHLORIDE ER 10 MEQ PO TBCR Oral Take 10 mEq by mouth daily.    . SERTRALINE HCL 100 MG PO TABS Oral Take 100 mg by mouth daily.    Marland Kitchen SIMVASTATIN 40 MG PO TABS Oral Take 40 mg by mouth every evening.    . IBUPROFEN 600 MG PO TABS Oral Take 1 tablet (600 mg total) by mouth every 8 (eight) hours as needed for pain. 15 tablet 0  . OXYCODONE-ACETAMINOPHEN 5-325 MG PO TABS Oral Take 1 tablet by mouth every 4 (four) hours as needed for pain. 20 tablet 0    BP 148/86  Pulse 83  Temp 98.7 F (37.1 C)  Resp 20  SpO2 99%  Physical Exam  Nursing note and vitals reviewed. Constitutional: She is oriented to person, place, and time. She appears well-developed and well-nourished. No distress.  HENT:  Head: Normocephalic and atraumatic.  Eyes:  EOM are normal. Pupils are equal, round, and reactive to light.  Neck: Neck supple.       Mild cervical and paracervical tenderness.  Immobilized in cervical collar  Cardiovascular: Normal rate, regular rhythm and normal heart sounds.   Pulmonary/Chest: Effort normal and breath sounds normal.  Abdominal: Soft. She exhibits no distension. There is no tenderness. There is no rebound.  Musculoskeletal: Normal range of motion.       Full range of motion of bilateral hips.  Normal pulses in her feet.  Neurological: She is alert and oriented to person, place, and time.  Skin: Skin is warm and dry.  Psychiatric: She has a normal mood and affect. Judgment normal.    ED Course  Procedures (including critical care time)  Labs Reviewed  COMPREHENSIVE METABOLIC PANEL - Abnormal; Notable for the following:    Glucose, Bld 138 (*)     AST 42 (*)     GFR calc non Af Amer 88 (*)     All other components within normal limits  CBC WITH DIFFERENTIAL - Abnormal; Notable for the following:    WBC 11.9 (*)     Hemoglobin 11.5 (*)     HCT 35.2 (*)     Neutrophils Relative 83 (*)     Neutro Abs 9.9 (*)     Lymphocytes Relative 10 (*)     All other components within normal limits   Dg Chest 2 View  03/17/2012  *RADIOLOGY REPORT*  Clinical Data: Pain post fall  CHEST - 2 VIEW  Comparison: 12/18/2011  Findings:  Prior left mastectomy.No pneumothorax. Lungs clear. Heart size and pulmonary vascularity normal.  No effusion. Visualized bones unremarkable.  IMPRESSION: No acute disease   Original Report Authenticated By: Osa Craver, M.D.    Dg Cervical Spine Complete  03/17/2012  *RADIOLOGY REPORT*  Clinical Data: Pain post fall  CERVICAL SPINE - COMPLETE 4+ VIEW  Comparison: None.  Findings: Negative for fracture, dislocation, or other acute bone abnormality.  No prevertebral soft tissue swelling.  No significant degenerative change.  IMPRESSION:  Negative for fracture or other acute abnormality.    Original Report Authenticated By: Osa Craver, M.D.    Dg Pelvis 1-2 Views  03/17/2012  *RADIOLOGY REPORT*  Clinical Data: Pain post fall.  PELVIS - 1-2 VIEW  Comparison: CT 09/13/2004  Findings: Stable bone island in the left ischium.  Bilateral pelvic phleboliths.  Negative for acute fracture, dislocation, or other acute bony abnormality.  Old fracture deformity of the left superior and inferior ischiopubic rami.  Spondylitic changes in the visualized lower lumbar spine.  IMPRESSION:  1.  No acute abnormality   Original Report Authenticated By: Osa Craver, M.D.     I personally reviewed the imaging tests through PACS system  I reviewed available ER/hospitalization records thought the EMR   1. Chest wall contusion   2. Fall       MDM  11:05 PM The patient is feeling better at this time.  No indication for head imaging.  C-spine cleared after cervical spine films.  Abdomen benign.  Full range of motion bilateral hips.  Discharge home with pain medicine.  Suspect rib contusion versus nondisplaced hairline rib fracture       Lyanne Co, MD 03/17/12 2310

## 2012-03-24 ENCOUNTER — Encounter: Payer: Self-pay | Admitting: Family Medicine

## 2012-03-24 ENCOUNTER — Ambulatory Visit (INDEPENDENT_AMBULATORY_CARE_PROVIDER_SITE_OTHER): Payer: Medicare HMO | Admitting: Family Medicine

## 2012-03-24 VITALS — BP 168/96 | HR 90 | Temp 98.9°F | Wt 211.0 lb

## 2012-03-24 DIAGNOSIS — M545 Low back pain, unspecified: Secondary | ICD-10-CM

## 2012-03-24 DIAGNOSIS — M542 Cervicalgia: Secondary | ICD-10-CM

## 2012-03-24 MED ORDER — CYCLOBENZAPRINE HCL 10 MG PO TABS: 10.0000 mg | ORAL_TABLET | Freq: Three times a day (TID) | ORAL | Status: DC | PRN

## 2012-03-24 MED ORDER — ALPRAZOLAM 0.5 MG PO TABS: 0.5000 mg | ORAL_TABLET | Freq: Three times a day (TID) | ORAL | Status: DC | PRN

## 2012-03-24 NOTE — Progress Notes (Signed)
  Subjective:    Patient ID: Rebekah Moreno, female    DOB: 1945/05/22, 66 y.o.   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 She went to the ER that day , and No fractures were found. Xrays were normal of the chest, cervcical spine, and pelvis. She was given Percocet and Ibuprofen. She has improved some but still has some stiffness and pain in the neck and lower back. She has been working full time.    Review of Systems  Constitutional: Negative.   HENT: Positive for neck pain and neck stiffness.   Musculoskeletal: Positive for back pain.  Neurological: Negative.        Objective:   Physical Exam  Constitutional: She is oriented to person, place, and time. She appears well-developed and well-nourished.  Musculoskeletal:       She is tender and has stiffness in the neck with reduced ROM. Also has stiffness and tenderness in the lower back with good ROM  Neurological: She is alert and oriented to person, place, and time. She has normal reflexes. No cranial nerve deficit. She exhibits normal muscle tone. Coordination normal.          Assessment & Plan:  Contusions to the neck and back . Add Flexeril for spasms. Try PT for a few weeks

## 2012-03-25 ENCOUNTER — Ambulatory Visit: Payer: Medicare HMO | Attending: Family Medicine

## 2012-03-25 DIAGNOSIS — IMO0001 Reserved for inherently not codable concepts without codable children: Secondary | ICD-10-CM | POA: Insufficient documentation

## 2012-03-25 DIAGNOSIS — R5381 Other malaise: Secondary | ICD-10-CM | POA: Insufficient documentation

## 2012-03-25 DIAGNOSIS — M2569 Stiffness of other specified joint, not elsewhere classified: Secondary | ICD-10-CM | POA: Insufficient documentation

## 2012-03-25 DIAGNOSIS — M545 Low back pain, unspecified: Secondary | ICD-10-CM | POA: Insufficient documentation

## 2012-03-25 DIAGNOSIS — M542 Cervicalgia: Secondary | ICD-10-CM | POA: Insufficient documentation

## 2012-03-26 ENCOUNTER — Ambulatory Visit: Payer: Medicare HMO

## 2012-03-27 ENCOUNTER — Telehealth: Payer: Self-pay | Admitting: Family Medicine

## 2012-03-27 MED ORDER — HYDROCODONE-ACETAMINOPHEN 10-325 MG PO TABS: 1.0000 | ORAL_TABLET | Freq: Four times a day (QID) | ORAL | Status: DC | PRN

## 2012-03-27 NOTE — Telephone Encounter (Signed)
I called in script and spoke with pt. 

## 2012-03-27 NOTE — Telephone Encounter (Signed)
Caller: Saphyra/Patient; Patient Name: Rebekah Moreno; PCP: Gershon Crane [facility]); Best Callback Phone Number: [phone] Patient states she fell this week and was seen in the ED and had follow up with Dr. Clent Ridges. She is requesting pain medication.  He gave her Flexeril and she has started Physical Therapy. She is using Ibuprofen for pain but that is not helping. She c/o of pain in her shoulders, left ribs and hips. She rates pain at a 8-9 on pain scale when she is moving around and trying to reach things. Emergent s/s of Falls protocol r/o. Pt to see provider within 4hrs. Pt states she was just seen in office, calling for a prescription to help with pain. Pharmacy is [facility]. Please call.

## 2012-03-27 NOTE — Telephone Encounter (Signed)
Call in Vicodin 10/325 to take q 6 hours prn pain, #60 with one rf

## 2012-03-30 ENCOUNTER — Ambulatory Visit: Payer: Medicare HMO

## 2012-04-01 ENCOUNTER — Ambulatory Visit: Payer: Medicare HMO

## 2012-04-06 ENCOUNTER — Ambulatory Visit (INDEPENDENT_AMBULATORY_CARE_PROVIDER_SITE_OTHER): Payer: Medicare HMO | Admitting: Family Medicine

## 2012-04-06 ENCOUNTER — Ambulatory Visit: Payer: Medicare HMO | Admitting: Physical Therapy

## 2012-04-06 ENCOUNTER — Encounter: Payer: Self-pay | Admitting: Family Medicine

## 2012-04-06 VITALS — BP 132/80 | HR 93 | Temp 98.5°F | Wt 209.0 lb

## 2012-04-06 DIAGNOSIS — S2020XA Contusion of thorax, unspecified, initial encounter: Secondary | ICD-10-CM

## 2012-04-06 DIAGNOSIS — M533 Sacrococcygeal disorders, not elsewhere classified: Secondary | ICD-10-CM

## 2012-04-06 DIAGNOSIS — S300XXA Contusion of lower back and pelvis, initial encounter: Secondary | ICD-10-CM

## 2012-04-07 ENCOUNTER — Encounter: Payer: Self-pay | Admitting: Family Medicine

## 2012-04-07 NOTE — Progress Notes (Signed)
  Subjective:    Patient ID: Rebekah Moreno, female    DOB: 11-10-1945, 66 y.o.   MRN: [id number]  HPI Here at the request of the PT office to follow up on pelvic pain after a fall on 03-17-12. She fell down some steps that day and was seen at the ER. She had numerous Xrays including Xrays of the pelvis, showing no fractures. She has been taking Flexeril and some Motrin and some Vicodin for the pain. She has been attending PT 3 days a week, but she has had a lot of pain around the tailbone and the perineal area. She was at PT today and they told her to come over here to be seen.    Review of Systems  Constitutional: Negative.   Musculoskeletal: Positive for arthralgias.       Objective:   Physical Exam  Constitutional: She appears well-developed and well-nourished.       She walks and gets on the exam table easily   Musculoskeletal:       Tender around the sacrum and over the coccyx. No crepitus. No swelling. Full ROM of the lower spine and hips.           Assessment & Plan:  She needs to be on an antiinflammatory medication, so I advised her to get back on Celebrex which she has at home. She will start at 200 mg bid for awhile and continue PT. See me in a few weeks.

## 2012-04-08 ENCOUNTER — Ambulatory Visit: Payer: Medicare HMO | Admitting: Physical Therapy

## 2012-04-10 ENCOUNTER — Ambulatory Visit: Payer: Medicare HMO | Admitting: Physical Therapy

## 2012-04-13 ENCOUNTER — Ambulatory Visit: Payer: Medicare HMO | Admitting: Physical Therapy

## 2012-04-20 ENCOUNTER — Ambulatory Visit: Payer: Medicare HMO | Attending: Family Medicine | Admitting: Physical Therapy

## 2012-04-20 DIAGNOSIS — M545 Low back pain, unspecified: Secondary | ICD-10-CM | POA: Insufficient documentation

## 2012-04-20 DIAGNOSIS — IMO0001 Reserved for inherently not codable concepts without codable children: Secondary | ICD-10-CM | POA: Insufficient documentation

## 2012-04-20 DIAGNOSIS — M542 Cervicalgia: Secondary | ICD-10-CM | POA: Insufficient documentation

## 2012-04-20 DIAGNOSIS — R5381 Other malaise: Secondary | ICD-10-CM | POA: Insufficient documentation

## 2012-04-22 ENCOUNTER — Ambulatory Visit: Payer: Medicare HMO

## 2012-04-29 ENCOUNTER — Telehealth: Payer: Self-pay | Admitting: Oncology

## 2012-04-29 NOTE — Telephone Encounter (Signed)
S/w the pt and she is aware of her cancelled 05/06/2012 appt that has been r/s to 05/05/2012.

## 2012-05-05 ENCOUNTER — Ambulatory Visit (HOSPITAL_BASED_OUTPATIENT_CLINIC_OR_DEPARTMENT_OTHER): Payer: Medicare HMO | Admitting: Oncology

## 2012-05-05 ENCOUNTER — Telehealth: Payer: Self-pay | Admitting: *Deleted

## 2012-05-05 ENCOUNTER — Encounter: Payer: Self-pay | Admitting: Oncology

## 2012-05-05 ENCOUNTER — Other Ambulatory Visit (HOSPITAL_BASED_OUTPATIENT_CLINIC_OR_DEPARTMENT_OTHER): Payer: Medicare HMO | Admitting: Lab

## 2012-05-05 VITALS — BP 153/84 | HR 93 | Temp 98.2°F | Resp 20 | Ht 67.0 in | Wt 208.4 lb

## 2012-05-05 DIAGNOSIS — C50219 Malignant neoplasm of upper-inner quadrant of unspecified female breast: Secondary | ICD-10-CM

## 2012-05-05 DIAGNOSIS — Z853 Personal history of malignant neoplasm of breast: Secondary | ICD-10-CM

## 2012-05-05 DIAGNOSIS — Z171 Estrogen receptor negative status [ER-]: Secondary | ICD-10-CM

## 2012-05-05 LAB — CBC WITH DIFFERENTIAL/PLATELET
Basophils Absolute: 0 10*3/uL (ref 0.0–0.1)
EOS%: 0 % (ref 0.0–7.0)
Eosinophils Absolute: 0 10*3/uL (ref 0.0–0.5)
HGB: 12.2 g/dL (ref 11.6–15.9)
MCH: 27.8 pg (ref 25.1–34.0)
NEUT#: 8 10*3/uL — ABNORMAL HIGH (ref 1.5–6.5)
RBC: 4.39 10*6/uL (ref 3.70–5.45)
RDW: 13.9 % (ref 11.2–14.5)
lymph#: 0.8 10*3/uL — ABNORMAL LOW (ref 0.9–3.3)

## 2012-05-05 LAB — COMPREHENSIVE METABOLIC PANEL (CC13)
ALT: 18 U/L (ref 0–55)
AST: 19 U/L (ref 5–34)
Albumin: 3.9 g/dL (ref 3.5–5.0)
BUN: 27 mg/dL — ABNORMAL HIGH (ref 7.0–26.0)
Calcium: 9.6 mg/dL (ref 8.4–10.4)
Chloride: 102 mEq/L (ref 98–107)
Potassium: 4.2 mEq/L (ref 3.5–5.1)
Sodium: 141 mEq/L (ref 136–145)
Total Protein: 6.8 g/dL (ref 6.4–8.3)

## 2012-05-05 NOTE — Patient Instructions (Addendum)
Continue to follow up every 4 months

## 2012-05-05 NOTE — Telephone Encounter (Signed)
Gave patient appointment for four month appointment

## 2012-05-05 NOTE — Progress Notes (Signed)
OFFICE PROGRESS NOTE  CC Rebekah Moreno Dr. Richarda Overlie Dr. Enriqueta Shutter, MD [address] [place] [address]  DIAGNOSIS: 66 year old female with multifocal invasive ductal carcinoma of the left breast status post left modified radical mastectomy with axillary lymph node dissection performed in November 2011. The tumor was ER negative PR negative HER-2/neu negative with a proliferation marker 18%  PRIOR THERAPY:  #1 patient underwent a mastectomy of the left breast with axillary lymph node dissection November 2011. She was found to have multifocal disease. The node dissection was negative for metastatic disease. Tumor was ER negative PR negative HER-2/neu negative proliferation marker 18%. She had immediate reconstruction.  #2 patient then received 4 cycles of adjuvant chemotherapy initially consisting of dose dense Adriamycin and Cytoxan from 04/25/2010 to 06/06/2010.  #3 she then received Taxol and carboplatinum from 06/21/2010 to 08/06/2010. She received a total of 7 cycles of combination. Patient developed peripheral paresthesias and severe myelo suppression. Thus carboplatin was discontinued and she went on to complete single agent Taxol from 08/20/2010 2 09/20/2010. Patient completed a total of 12 weeks of taxane containing regimen  #3 patient has undergone reconstruction by Dr. Etter Sjogren.  CURRENT THERAPY:Observation  INTERVAL HISTORY: Rebekah Moreno 66 y.o. female returns for Followup visit today. Clinically and oncological he she is doing well.She is working full-time she does complain of some fatigue. She has had a little bit of residual neuropathy off-and-on. But nothing consistent. She denies any headaches double vision blurring of vision no fevers chills or night sweats. She has no bleeding no chest pains no shortness of breath. She does complain of fatigue. We discussed exercise today. Remainder of the template review of systems is  negative. MEDICAL HISTORY: Past Medical History  Diagnosis Date  . Hypertension   . Hyperlipidemia   . GERD (gastroesophageal reflux disease)   . Asthma   . Arthritis   . Colon polyps   . Compression fx, lumbar spine     l2  . Osteoporosis   . Hematuria   . Anxiety   . Peptic stricture of esophagus   . Hemorrhoids   . Cancer     Breast, sees Dr. Dara Lords     ALLERGIES:  is allergic to dust mite extract; mold extract; and other.  MEDICATIONS:  Current Outpatient Prescriptions  Medication Sig Dispense Refill  . albuterol (PROVENTIL HFA;VENTOLIN HFA) 108 (90 BASE) MCG/ACT inhaler Inhale 1 puff into the lungs every 4 (four) hours as needed. Wheezing/shortness of breath.      . ALPRAZolam (XANAX) 0.5 MG tablet Take 1 tablet (0.5 mg total) by mouth 3 (three) times daily as needed for sleep or anxiety.  90 tablet  5  . celecoxib (CELEBREX) 200 MG capsule Take 200 mg by mouth daily.      . cyclobenzaprine (FLEXERIL) 10 MG tablet Take 1 tablet (10 mg total) by mouth 3 (three) times daily as needed for muscle spasms.  60 tablet  5  . enalapril-hydrochlorothiazide (VASERETIC) 10-25 MG per tablet Take 1 tablet by mouth daily.      Marland Kitchen esomeprazole (NEXIUM) 40 MG capsule Take 40 mg by mouth daily before breakfast.      . HYDROcodone-acetaminophen (NORCO) 10-325 MG per tablet Take 1 tablet by mouth every 6 (six) hours as needed for pain.  60 tablet  1  . ibuprofen (ADVIL,MOTRIN) 200 MG tablet Take 200 mg by mouth every 6 (six) hours as needed. Pain      .  Multiple Vitamins-Minerals (CENTRUM ULTRA WOMENS) TABS Take 1 tablet by mouth daily.        . potassium chloride (K-DUR) 10 MEQ tablet Take 10 mEq by mouth daily.      . sertraline (ZOLOFT) 100 MG tablet Take 100 mg by mouth daily.      . simvastatin (ZOCOR) 40 MG tablet Take 40 mg by mouth every evening.        SURGICAL HISTORY:  Past Surgical History  Procedure Date  . Knee arthroscopy     left  . Joint replacement     left knee   . Left knee arthroscopy  02/2001  . Total knee arthroplasty left 12/2--3    dr s Living[place] Diones dean  . L3,l4 discectomy 11/2000    per Dr. Gerlene Fee   . Rhinoplasty     dr Jearld Fenton  . Hemorrhoid surgery   . Mastectomy 11/11    left breast  . Portacath placement   . Colonoscopy 11-10-07    per Dr. Russella Dar, int and ext hemorrhoids, repeat in 5 yrs   . Port-a-cath removal   . Breast surgery     left mastectomy and reconstruction    REVIEW OF SYSTEMS:  Pertinent items are noted in HPI.   PHYSICAL EXAMINATION: General appearance: alert, cooperative and appears stated age Head: Normocephalic, without obvious abnormality, atraumatic Neck: no adenopathy, no carotid bruit, no JVD, supple, symmetrical, trachea midline and thyroid not enlarged, symmetric, no tenderness/mass/nodules Lymph nodes: Cervical, supraclavicular, and axillary nodes normal. Resp: clear to auscultation bilaterally and normal percussion bilaterally Back: symmetric, no curvature. ROM normal. No CVA tenderness. Cardio: regular rate and rhythm, S1, S2 normal, no murmur, click, rub or gallop and normal apical impulse GI: soft, non-tender; bowel sounds normal; no masses,  no organomegaly Extremities: extremities normal, atraumatic, no cyanosis or edema Neurologic: Alert and oriented X 3, normal strength and tone. Normal symmetric reflexes. Normal coordination and gait Breast examination right breast no masses temple discharge no nipple retraction. Left reconstructed breast looks well healed there is no overlying skin changes she does have a reconstructed nipple present ECOG PERFORMANCE STATUS: 0 - Asymptomatic  Blood pressure 153/84, pulse 93, temperature 98.2 F (36.8 C), temperature source Oral, resp. rate 20, height 5\' 7"  (1.702 m), weight 208 lb 6.4 oz (94.53 kg).  LABORATORY DATA: Lab Results  Component Value Date   WBC 9.4 05/05/2012   HGB 12.2 05/05/2012   HCT 37.6 05/05/2012   MCV 85.6 05/05/2012   PLT 210 05/05/2012       Chemistry      Component Value Date/Time   NA 135 03/17/2012 2000   K 4.2 03/17/2012 2000   CL 97 03/17/2012 2000   CO2 28 03/17/2012 2000   BUN 18 03/17/2012 2000   CREATININE 0.72 03/17/2012 2000      Component Value Date/Time   CALCIUM 9.6 03/17/2012 2000   ALKPHOS 98 03/17/2012 2000   AST 42* 03/17/2012 2000   ALT 23 03/17/2012 2000   BILITOT 0.4 03/17/2012 2000       RADIOGRAPHIC STUDIES:  No results found.  ASSESSMENT: 66 year old female with  #1 multifocal invasive ductal carcinoma of the left breast status post modified radical mastectomy with axillary lymph node dissection performed in November 2011. Her final pathology showed a triple negative disease.She is status post adjuvant chemotherapy consisting of initially Adriamycin Cytoxan followed by Taxol carboplatinum.  #2 she has a reconstructed left breast.  #3 bilateral arthritis of the knees.  #4 depression and  anxiety.  #5 hypercholesterolemia  #6 hypertension   PLAN:   #1 overall patient is doing well she has no evidence of recurrent disease. She is up to date on her mammograms.  #2 I will plan on seeing her back in 4 months time.  #3 exercise and diet are discussed. And stress reduction.   All questions were answered. The patient knows to call the clinic with any problems, questions or concerns. We can certainly see the patient much sooner if necessary.  I spent 25 minutes counseling the patient face to face. The total time spent in the appointment was 30 minutes.    Drue Second, MD Medical/Oncology [facility] [phone] (beeper) [phone] (Office)  05/05/2012, 11:40 AM

## 2012-05-06 ENCOUNTER — Ambulatory Visit: Payer: Medicare HMO | Admitting: Oncology

## 2012-05-06 ENCOUNTER — Other Ambulatory Visit: Payer: Medicare HMO | Admitting: Lab

## 2012-05-17 ENCOUNTER — Other Ambulatory Visit: Payer: Self-pay | Admitting: Family Medicine

## 2012-06-15 ENCOUNTER — Other Ambulatory Visit: Payer: Self-pay | Admitting: Family Medicine

## 2012-07-03 ENCOUNTER — Encounter (HOSPITAL_COMMUNITY): Payer: Self-pay

## 2012-07-03 ENCOUNTER — Emergency Department (HOSPITAL_COMMUNITY): Payer: Medicare HMO

## 2012-07-03 ENCOUNTER — Inpatient Hospital Stay (HOSPITAL_COMMUNITY)
Admission: EM | Admit: 2012-07-03 | Discharge: 2012-07-07 | DRG: 482 | Disposition: A | Discharge status: Skilled Nursing Facility | Payer: Medicare HMO | Attending: Orthopaedic Surgery | Admitting: Orthopaedic Surgery

## 2012-07-03 DIAGNOSIS — F411 Generalized anxiety disorder: Secondary | ICD-10-CM | POA: Diagnosis present

## 2012-07-03 DIAGNOSIS — W010XXA Fall on same level from slipping, tripping and stumbling without subsequent striking against object, initial encounter: Secondary | ICD-10-CM | POA: Diagnosis present

## 2012-07-03 DIAGNOSIS — I10 Essential (primary) hypertension: Secondary | ICD-10-CM

## 2012-07-03 DIAGNOSIS — E785 Hyperlipidemia, unspecified: Secondary | ICD-10-CM | POA: Diagnosis present

## 2012-07-03 DIAGNOSIS — Z860100 Personal history of colon polyps, unspecified: Secondary | ICD-10-CM

## 2012-07-03 DIAGNOSIS — Z853 Personal history of malignant neoplasm of breast: Secondary | ICD-10-CM

## 2012-07-03 DIAGNOSIS — M810 Age-related osteoporosis without current pathological fracture: Secondary | ICD-10-CM | POA: Diagnosis present

## 2012-07-03 DIAGNOSIS — K219 Gastro-esophageal reflux disease without esophagitis: Secondary | ICD-10-CM | POA: Diagnosis present

## 2012-07-03 DIAGNOSIS — S7291XA Unspecified fracture of right femur, initial encounter for closed fracture: Secondary | ICD-10-CM

## 2012-07-03 DIAGNOSIS — M1990 Unspecified osteoarthritis, unspecified site: Secondary | ICD-10-CM

## 2012-07-03 DIAGNOSIS — Z9010 Acquired absence of unspecified breast and nipple: Secondary | ICD-10-CM

## 2012-07-03 DIAGNOSIS — J45909 Unspecified asthma, uncomplicated: Secondary | ICD-10-CM | POA: Diagnosis present

## 2012-07-03 DIAGNOSIS — Z8719 Personal history of other diseases of the digestive system: Secondary | ICD-10-CM

## 2012-07-03 DIAGNOSIS — Y929 Unspecified place or not applicable: Secondary | ICD-10-CM

## 2012-07-03 DIAGNOSIS — Z96659 Presence of unspecified artificial knee joint: Secondary | ICD-10-CM

## 2012-07-03 DIAGNOSIS — M1710 Unilateral primary osteoarthritis, unspecified knee: Secondary | ICD-10-CM | POA: Diagnosis present

## 2012-07-03 DIAGNOSIS — S72451A Displaced supracondylar fracture without intracondylar extension of lower end of right femur, initial encounter for closed fracture: Secondary | ICD-10-CM

## 2012-07-03 DIAGNOSIS — S72453A Displaced supracondylar fracture without intracondylar extension of lower end of unspecified femur, initial encounter for closed fracture: Principal | ICD-10-CM | POA: Diagnosis present

## 2012-07-03 LAB — CBC WITH DIFFERENTIAL/PLATELET
HCT: 33.6 % — ABNORMAL LOW (ref 36.0–46.0)
Hemoglobin: 11.1 g/dL — ABNORMAL LOW (ref 12.0–15.0)
Lymphocytes Relative: 12 % (ref 12–46)
Lymphs Abs: 1.1 10*3/uL (ref 0.7–4.0)
MCV: 85.9 fL (ref 78.0–100.0)
Monocytes Absolute: 0.5 10*3/uL (ref 0.1–1.0)
Monocytes Relative: 5 % (ref 3–12)
Neutro Abs: 7.2 10*3/uL (ref 1.7–7.7)
WBC: 9.1 10*3/uL (ref 4.0–10.5)

## 2012-07-03 LAB — SAMPLE TO BLOOD BANK

## 2012-07-03 MED ORDER — FENTANYL CITRATE 0.05 MG/ML IJ SOLN
50.0000 ug | Freq: Once | INTRAMUSCULAR | Status: AC
  Administered 2012-07-03: 50 ug via INTRAVENOUS
  Filled 2012-07-03: qty 2

## 2012-07-03 MED ORDER — HYDROMORPHONE HCL PF 1 MG/ML IJ SOLN
1.0000 mg | Freq: Once | INTRAMUSCULAR | Status: AC
  Administered 2012-07-03: 1 mg via INTRAVENOUS
  Filled 2012-07-03: qty 1

## 2012-07-03 NOTE — ED Notes (Signed)
Per ems- Pt was ambulating and right knee "gave out." Pt caught herself with her hands but "felt a pop in right knees." Denies hitting head, denies neck/back pain. Right knee very swollen, right foot colder than left. Pulses not palpable. Questionable patellar deformity. fentanyl given PTA, 4mg  zofran. 22g IV Right hand placed en route. BP-162/80 HR-100 O2-95 RR-18

## 2012-07-03 NOTE — Progress Notes (Signed)
67 yo woman fell and injured the right thigh.  Exam shows tenderness over the right distal femur.  The skin is intact.  X-rays show a fracture of the right distal femur with angulation of about 45 degrees.  Call to Marcene Corning, M.D., her orthopedist, who will be in to see her.

## 2012-07-03 NOTE — ED Provider Notes (Signed)
History     CSN: [id number]  Arrival date & time 07/03/12  2042   First MD Initiated Contact with Patient 07/03/12 2054      Chief Complaint  Patient presents with  . Fall  . Knee Pain    (Consider location/radiation/quality/duration/timing/severity/associated sxs/prior treatment) The history is provided by the patient, the EMS personnel and medical records.    Rebekah Moreno is a 67 y.o. female  with a hx of hypertension, hyperlipidemia, arthritis, osteoporosis, anxiety presents to the Emergency Department complaining of acute, persistent pain in the right leg onset 45 minutes prior to arrival.  Patient states she was walking when her right knee gave out and she fell. Patient states she caught herself with her hands but she felt a pop in her right knee. She denies loss of consciousness, hitting her head, neck or back pain. Associated symptoms include swelling in the right lower extremity, pain to palpation, pain with movement.  Lying still makes it better and palpation and movement makes it worse.  Pt denies fever, chills, headache, neck pain, chest pain, shortness of breath, abdominal pain, nausea, vomiting, diarrhea, weakness, dizziness or syncope, dysuria, hematuria.  Patient states she's had problems with her right knee for some time now. She scheduled for knee replacement on March 13 by Dr. Margreta Journey at [facility] orthopedic.  OrthoMargreta Journey - scheduled for knee replacement on March 13  Past Medical History  Diagnosis Date  . Hypertension   . Hyperlipidemia   . GERD (gastroesophageal reflux disease)   . Asthma   . Arthritis   . Colon polyps   . Compression fx, lumbar spine     l2  . Osteoporosis   . Hematuria   . Anxiety   . Peptic stricture of esophagus   . Hemorrhoids   . Cancer     Breast, sees Dr. Dara Lords     Past Surgical History  Procedure Laterality Date  . Knee arthroscopy      left  . Joint replacement      left knee  . Left knee arthroscopy    02/2001  . Total knee arthroplasty  left 12/2--3    dr s [place] Diones dean  . L3,l4 discectomy  11/2000    per Dr. Gerlene Fee   . Rhinoplasty      dr Jearld Fenton  . Hemorrhoid surgery    . Mastectomy  11/11    left breast  . Portacath placement    . Colonoscopy  11-10-07    per Dr. Russella Dar, int and ext hemorrhoids, repeat in 5 yrs   . Port-a-cath removal    . Breast surgery      left mastectomy and reconstruction    Family History  Problem Relation Age of Onset  . Arthritis    . Hypertension    . Cancer    . Stroke    . Heart disease    . Heart disease Paternal Grandmother     History  Substance Use Topics  . Smoking status: Never Smoker   . Smokeless tobacco: Never Used  . Alcohol Use: 0.5 oz/week    1 drink(s) per week    OB History   Grav Para Term Preterm Abortions TAB SAB Ect Mult Living                  Review of Systems  Constitutional: Negative for fever, diaphoresis, appetite change, fatigue and unexpected weight change.  HENT: Negative for mouth sores and neck stiffness.   Eyes:  Negative for visual disturbance.  Respiratory: Negative for cough, chest tightness, shortness of breath and wheezing.   Cardiovascular: Negative for chest pain.  Gastrointestinal: Negative for nausea, vomiting, abdominal pain, diarrhea and constipation.  Genitourinary: Negative for dysuria, urgency, frequency and hematuria.  Musculoskeletal: Positive for joint swelling, arthralgias and gait problem (and inability to weight-bear). Negative for back pain.  Skin: Negative for rash.  Neurological: Negative for syncope, light-headedness and headaches.  Hematological: Does not bruise/bleed easily.  Psychiatric/Behavioral: Negative for sleep disturbance. The patient is not nervous/anxious.   All other systems reviewed and are negative.    Allergies  Dust mite extract; Mold extract; and Other  Home Medications   Current Outpatient Rx  Name  Route  Sig  Dispense  Refill  . ALPRAZolam (XANAX) 0.5  MG tablet   Oral   Take 0.5 mg by mouth 3 (three) times daily as needed for sleep or anxiety.         . celecoxib (CELEBREX) 200 MG capsule   Oral   Take 200 mg by mouth daily.         . cyclobenzaprine (FLEXERIL) 10 MG tablet   Oral   Take 10 mg by mouth 3 (three) times daily as needed for muscle spasms.         . enalapril-hydrochlorothiazide (VASERETIC) 10-25 MG per tablet   Oral   Take 1 tablet by mouth daily.         Marland Kitchen esomeprazole (NEXIUM) 40 MG capsule   Oral   Take 40 mg by mouth daily before breakfast.         . HYDROcodone-acetaminophen (NORCO) 10-325 MG per tablet   Oral   Take 1 tablet by mouth every 6 (six) hours as needed for pain.         . Multiple Vitamins-Minerals (CENTRUM ULTRA WOMENS) TABS   Oral   Take 1 tablet by mouth daily.           . sertraline (ZOLOFT) 100 MG tablet   Oral   Take 100 mg by mouth daily.         . simvastatin (ZOCOR) 40 MG tablet   Oral   Take 40 mg by mouth every evening.         Marland Kitchen ibuprofen (ADVIL,MOTRIN) 200 MG tablet   Oral   Take 200 mg by mouth every 6 (six) hours as needed. Pain           BP 140/74  Pulse 104  Temp(Src) 98.1 F (36.7 C) (Oral)  Resp 22  SpO2 96%  Physical Exam  Nursing note and vitals reviewed. Constitutional: She is oriented to person, place, and time. She appears well-developed and well-nourished. No distress.  Patient arrives fully immobilized.  HENT:  Head: Normocephalic and atraumatic.  Mouth/Throat: Oropharynx is clear and moist. No oropharyngeal exudate.  Eyes: Conjunctivae and EOM are normal. Pupils are equal, round, and reactive to light. No scleral icterus.  Neck: Normal range of motion and full passive range of motion without pain. Neck supple. No spinous process tenderness and no muscular tenderness present. No rigidity. Normal range of motion present.  Cardiovascular: Normal rate, regular rhythm, normal heart sounds and intact distal pulses.  Exam reveals no  gallop and no friction rub.   No murmur heard. Capillary refill less than 3 seconds  Pulmonary/Chest: Effort normal and breath sounds normal. No respiratory distress. She has no wheezes. She has no rales. She exhibits no tenderness.  Abdominal: Soft. Bowel sounds are  normal. She exhibits no distension and no mass. There is no tenderness. There is no rebound and no guarding.  Musculoskeletal: She exhibits no edema and no tenderness.       Right knee: She exhibits decreased range of motion and swelling. She exhibits no effusion, no ecchymosis, no deformity, no laceration and no erythema. Tenderness found.       Right upper leg: She exhibits tenderness, bony tenderness, swelling and deformity.       Legs: Full range of motion of the right toes and right ankle  Neurological: She is alert and oriented to person, place, and time. She exhibits normal muscle tone. Coordination normal.  Speech is clear and goal oriented Sensation intact Strength 5 out of 5 in the right toes and right ankle; unable to test strength of the right knee secondary pain  Skin: Skin is warm and dry. She is not diaphoretic. No erythema.  Psychiatric: She has a normal mood and affect.    ED Course  Procedures (including critical care time)  Labs Reviewed  CBC WITH DIFFERENTIAL - Abnormal; Notable for the following:    Hemoglobin 11.1 (*)    HCT 33.6 (*)    Neutrophils Relative 80 (*)    All other components within normal limits  COMPREHENSIVE METABOLIC PANEL - Abnormal; Notable for the following:    Glucose, Bld 144 (*)    BUN 25 (*)    Albumin 3.4 (*)    Total Bilirubin 0.2 (*)    GFR calc non Af Amer 75 (*)    GFR calc Af Amer 87 (*)    All other components within normal limits  URINALYSIS, ROUTINE W REFLEX MICROSCOPIC  SAMPLE TO BLOOD BANK   Dg Femur Right  07/03/2012  *RADIOLOGY REPORT*  Clinical Data: Status post fall  RIGHT FEMUR - 2 VIEW  Comparison: None  Findings: There is an acute, comminuted,  intra-articular deformity involving the distal shaft of the right femur. Fracture line extends into the femoral notch.  There is dorsal angulation of the distal fracture fragments by approximately 45 degrees.  IMPRESSION:  1.  Comminuted, intra-articular fracture of the distal femur. 2.  Dorsal angulation of the distal fracture fragments   Original Report Authenticated By: Signa Kell, M.D.    Dg Knee 1-2 Views Right  07/03/2012  *RADIOLOGY REPORT*  Clinical Data: Status post fall  RIGHT KNEE - 1-2 VIEW  Comparison: None  Findings: The bones appear osteopenic.  There is a comminuted, intra-articular fracture involving the distal shaft of the right femur.  On the lateral radiograph there is dorsal angulation of the distal fracture fragments by approximately 45 degrees.  IMPRESSION:  1.  Comminuted, intra-articular fracture of the distal femur. 2.  Dorsal angulation of approximately 45 degrees   Original Report Authenticated By: Signa Kell, M.D.    Dg Chest Port 1 View  07/04/2012  *RADIOLOGY REPORT*  Clinical Data: Preop ORIF of femur fracture  PORTABLE CHEST - 1 VIEW  Comparison: 03/17/2012  Findings: Normal heart size.  No pleural effusion or edema.  There is no airspace consolidation.  Hiatal hernia is noted.  The visualized osseous structures appear intact.  IMPRESSION:  1.  No acute cardiopulmonary abnormalities. 2.  Hiatal hernia.   Original Report Authenticated By: Signa Kell, M.D.      1. Femur fracture, right, closed, initial encounter   2. OSTEOARTHRITIS   3. OSTEOPOROSIS   4. HYPERTENSION       MDM  [place] Pink presents  after fall with right lower extremity pain and deformity.  Significant concern for femur fracture versus knee dislocation.  Will obtain imaging and treat pain.  Patient removed from spinal immobilization without pain to palpation of the C-spine, T-spine or L-spine.  X-ray with Comminuted, intra-articular fracture of the distal femur. Dorsal angulation of  approximately 45 degrees.  Basic labs obtained.  Orthopedics consulted; they will admit for further management.    Dr. Ignacia Palma was consulted, evaluated this patient with me and agrees with the plan.             Dahlia Client Olivianna Higley, PA-C 07/04/12 [id number]

## 2012-07-03 NOTE — ED Notes (Signed)
BLE dopplerable only. Not palpable. Sites marked.

## 2012-07-04 ENCOUNTER — Encounter (HOSPITAL_COMMUNITY): Payer: Self-pay | Admitting: Certified Registered"

## 2012-07-04 ENCOUNTER — Inpatient Hospital Stay (HOSPITAL_COMMUNITY): Payer: Medicare HMO

## 2012-07-04 ENCOUNTER — Encounter (HOSPITAL_COMMUNITY)
Admission: EM | Disposition: A | Discharge status: Skilled Nursing Facility | Payer: Self-pay | Source: Home / Self Care | Attending: Orthopaedic Surgery

## 2012-07-04 ENCOUNTER — Inpatient Hospital Stay (HOSPITAL_COMMUNITY): Payer: Medicare HMO | Admitting: Certified Registered"

## 2012-07-04 DIAGNOSIS — S72451A Displaced supracondylar fracture without intracondylar extension of lower end of right femur, initial encounter for closed fracture: Secondary | ICD-10-CM

## 2012-07-04 HISTORY — PX: FEMUR IM NAIL: SHX1597

## 2012-07-04 LAB — COMPREHENSIVE METABOLIC PANEL
AST: 19 U/L (ref 0–37)
BUN: 25 mg/dL — ABNORMAL HIGH (ref 6–23)
CO2: 30 mEq/L (ref 19–32)
Chloride: 102 mEq/L (ref 96–112)
Creatinine, Ser: 0.8 mg/dL (ref 0.50–1.10)
GFR calc Af Amer: 87 mL/min — ABNORMAL LOW (ref >90)
GFR calc non Af Amer: 75 mL/min — ABNORMAL LOW (ref >90)
Glucose, Bld: 144 mg/dL — ABNORMAL HIGH (ref 70–99)
Total Bilirubin: 0.2 mg/dL — ABNORMAL LOW (ref 0.3–1.2)

## 2012-07-04 LAB — URINALYSIS, ROUTINE W REFLEX MICROSCOPIC
Hgb urine dipstick: NEGATIVE
Protein, ur: NEGATIVE mg/dL
Urobilinogen, UA: 0.2 mg/dL (ref 0.0–1.0)

## 2012-07-04 SURGERY — INSERTION, INTRAMEDULLARY ROD, FEMUR, RETROGRADE
Anesthesia: General | Site: Leg Upper | Laterality: Right | Wound class: Clean

## 2012-07-04 MED ORDER — PHENYLEPHRINE HCL 10 MG/ML IJ SOLN
10.0000 mg | INTRAVENOUS | Status: DC | PRN
  Administered 2012-07-04: 40 ug/min via INTRAVENOUS

## 2012-07-04 MED ORDER — BISACODYL 5 MG PO TBEC: 5.0000 mg | DELAYED_RELEASE_TABLET | Freq: Every day | ORAL | Status: DC | PRN

## 2012-07-04 MED ORDER — ALPRAZOLAM 0.5 MG PO TABS
0.5000 mg | ORAL_TABLET | Freq: Three times a day (TID) | ORAL | Status: DC | PRN
  Administered 2012-07-05 – 2012-07-07 (×5): 0.5 mg via ORAL
  Filled 2012-07-04 (×5): qty 1

## 2012-07-04 MED ORDER — HYDROMORPHONE HCL PF 1 MG/ML IJ SOLN
0.5000 mg | INTRAMUSCULAR | Status: DC | PRN
  Administered 2012-07-04 – 2012-07-05 (×2): 1 mg via INTRAVENOUS
  Filled 2012-07-04 (×2): qty 1

## 2012-07-04 MED ORDER — FENTANYL CITRATE 0.05 MG/ML IJ SOLN
INTRAMUSCULAR | Status: DC | PRN
  Administered 2012-07-04: 50 ug via INTRAVENOUS

## 2012-07-04 MED ORDER — OXYCODONE HCL 5 MG PO TABS
5.0000 mg | ORAL_TABLET | Freq: Once | ORAL | Status: AC | PRN
  Administered 2012-07-04: 5 mg via ORAL

## 2012-07-04 MED ORDER — HYDROMORPHONE HCL PF 1 MG/ML IJ SOLN
1.0000 mg | Freq: Once | INTRAMUSCULAR | Status: AC
  Administered 2012-07-04: 1 mg via INTRAVENOUS
  Filled 2012-07-04: qty 1

## 2012-07-04 MED ORDER — CELECOXIB 200 MG PO CAPS
200.0000 mg | ORAL_CAPSULE | Freq: Every day | ORAL | Status: DC
  Filled 2012-07-04: qty 1

## 2012-07-04 MED ORDER — ENALAPRIL MALEATE 10 MG PO TABS
10.0000 mg | ORAL_TABLET | Freq: Every day | ORAL | Status: DC
  Administered 2012-07-05 – 2012-07-07 (×3): 10 mg via ORAL
  Filled 2012-07-04 (×4): qty 1

## 2012-07-04 MED ORDER — HYDROMORPHONE HCL PF 1 MG/ML IJ SOLN
0.2500 mg | INTRAMUSCULAR | Status: DC | PRN
  Administered 2012-07-04: 0.5 mg via INTRAVENOUS

## 2012-07-04 MED ORDER — ENALAPRIL-HYDROCHLOROTHIAZIDE 10-25 MG PO TABS: 1.0000 | ORAL_TABLET | Freq: Every day | ORAL | Status: DC

## 2012-07-04 MED ORDER — FERROUS SULFATE 325 (65 FE) MG PO TABS
325.0000 mg | ORAL_TABLET | Freq: Every day | ORAL | Status: DC
  Administered 2012-07-05 – 2012-07-07 (×3): 325 mg via ORAL
  Filled 2012-07-04 (×4): qty 1

## 2012-07-04 MED ORDER — METHOCARBAMOL 100 MG/ML IJ SOLN
500.0000 mg | Freq: Four times a day (QID) | INTRAVENOUS | Status: DC | PRN
  Filled 2012-07-04: qty 5

## 2012-07-04 MED ORDER — DEXTROSE IN LACTATED RINGERS 5 % IV SOLN
INTRAVENOUS | Status: DC
  Administered 2012-07-04 – 2012-07-05 (×2): via INTRAVENOUS

## 2012-07-04 MED ORDER — SERTRALINE HCL 100 MG PO TABS
100.0000 mg | ORAL_TABLET | Freq: Every day | ORAL | Status: DC
  Administered 2012-07-05 – 2012-07-07 (×3): 100 mg via ORAL
  Filled 2012-07-04 (×4): qty 1

## 2012-07-04 MED ORDER — HYDROMORPHONE HCL PF 1 MG/ML IJ SOLN
1.0000 mg | INTRAMUSCULAR | Status: DC | PRN
  Administered 2012-07-04 (×3): 1 mg via INTRAVENOUS
  Filled 2012-07-04 (×3): qty 1

## 2012-07-04 MED ORDER — METOCLOPRAMIDE HCL 5 MG/ML IJ SOLN: 5.0000 mg | Freq: Three times a day (TID) | INTRAMUSCULAR | Status: DC | PRN

## 2012-07-04 MED ORDER — LACTATED RINGERS IV SOLN
INTRAVENOUS | Status: DC | PRN
  Administered 2012-07-04 (×2): via INTRAVENOUS

## 2012-07-04 MED ORDER — OXYCODONE HCL 5 MG PO TABS
ORAL_TABLET | ORAL | Status: AC
  Filled 2012-07-04: qty 1

## 2012-07-04 MED ORDER — PROPOFOL 10 MG/ML IV BOLUS
INTRAVENOUS | Status: DC | PRN
  Administered 2012-07-04: 120 mg via INTRAVENOUS
  Administered 2012-07-04: 30 mg via INTRAVENOUS

## 2012-07-04 MED ORDER — HYDROCHLOROTHIAZIDE 25 MG PO TABS
25.0000 mg | ORAL_TABLET | Freq: Every day | ORAL | Status: DC
  Administered 2012-07-05 – 2012-07-07 (×3): 25 mg via ORAL
  Filled 2012-07-04 (×4): qty 1

## 2012-07-04 MED ORDER — ALPRAZOLAM 0.5 MG PO TABS: 0.5000 mg | ORAL_TABLET | Freq: Three times a day (TID) | ORAL | Status: DC | PRN

## 2012-07-04 MED ORDER — CEFAZOLIN SODIUM-DEXTROSE 2-3 GM-% IV SOLR
INTRAVENOUS | Status: DC | PRN
  Administered 2012-07-04: 2 g via INTRAVENOUS

## 2012-07-04 MED ORDER — DOCUSATE SODIUM 100 MG PO CAPS
100.0000 mg | ORAL_CAPSULE | Freq: Two times a day (BID) | ORAL | Status: DC
  Administered 2012-07-04 – 2012-07-07 (×6): 100 mg via ORAL
  Filled 2012-07-04 (×6): qty 1

## 2012-07-04 MED ORDER — METOCLOPRAMIDE HCL 10 MG PO TABS: 5.0000 mg | ORAL_TABLET | Freq: Three times a day (TID) | ORAL | Status: DC | PRN

## 2012-07-04 MED ORDER — OXYCODONE HCL 5 MG/5ML PO SOLN: 5.0000 mg | Freq: Once | ORAL | Status: AC | PRN

## 2012-07-04 MED ORDER — PANTOPRAZOLE SODIUM 40 MG PO TBEC
40.0000 mg | DELAYED_RELEASE_TABLET | Freq: Every day | ORAL | Status: DC
  Administered 2012-07-04 – 2012-07-07 (×4): 40 mg via ORAL
  Filled 2012-07-04 (×4): qty 1

## 2012-07-04 MED ORDER — HYDROCODONE-ACETAMINOPHEN 10-325 MG PO TABS
1.0000 | ORAL_TABLET | ORAL | Status: DC | PRN
  Administered 2012-07-05: 1 via ORAL
  Administered 2012-07-05 (×2): 2 via ORAL
  Administered 2012-07-06 (×3): 1 via ORAL
  Administered 2012-07-07 (×2): 2 via ORAL
  Filled 2012-07-04 (×2): qty 1
  Filled 2012-07-04: qty 2
  Filled 2012-07-04: qty 1
  Filled 2012-07-04: qty 2
  Filled 2012-07-04 (×3): qty 1
  Filled 2012-07-04: qty 2

## 2012-07-04 MED ORDER — ONDANSETRON HCL 4 MG PO TABS
4.0000 mg | ORAL_TABLET | Freq: Four times a day (QID) | ORAL | Status: DC | PRN
  Administered 2012-07-05: 4 mg via ORAL
  Filled 2012-07-04 (×2): qty 1

## 2012-07-04 MED ORDER — CEFAZOLIN SODIUM-DEXTROSE 2-3 GM-% IV SOLR
2.0000 g | Freq: Four times a day (QID) | INTRAVENOUS | Status: AC
  Administered 2012-07-04 – 2012-07-05 (×2): 2 g via INTRAVENOUS
  Filled 2012-07-04 (×2): qty 50

## 2012-07-04 MED ORDER — ONDANSETRON HCL 4 MG/2ML IJ SOLN: 4.0000 mg | Freq: Four times a day (QID) | INTRAMUSCULAR | Status: DC | PRN

## 2012-07-04 MED ORDER — MIDAZOLAM HCL 5 MG/5ML IJ SOLN
INTRAMUSCULAR | Status: DC | PRN
  Administered 2012-07-04: 1 mg via INTRAVENOUS

## 2012-07-04 MED ORDER — SUFENTANIL CITRATE 50 MCG/ML IV SOLN
INTRAVENOUS | Status: DC | PRN
  Administered 2012-07-04 (×2): 10 ug via INTRAVENOUS

## 2012-07-04 MED ORDER — SIMVASTATIN 40 MG PO TABS
40.0000 mg | ORAL_TABLET | Freq: Every evening | ORAL | Status: DC
Start: 2012-07-04 — End: 2012-07-07
  Administered 2012-07-05 – 2012-07-06 (×2): 40 mg via ORAL
  Filled 2012-07-04 (×4): qty 1

## 2012-07-04 MED ORDER — ASPIRIN EC 325 MG PO TBEC
325.0000 mg | DELAYED_RELEASE_TABLET | Freq: Two times a day (BID) | ORAL | Status: DC
  Administered 2012-07-05 – 2012-07-07 (×5): 325 mg via ORAL
  Filled 2012-07-04 (×7): qty 1

## 2012-07-04 MED ORDER — CYCLOBENZAPRINE HCL 10 MG PO TABS
10.0000 mg | ORAL_TABLET | Freq: Three times a day (TID) | ORAL | Status: DC | PRN
  Administered 2012-07-04: 10 mg via ORAL
  Filled 2012-07-04 (×2): qty 1

## 2012-07-04 MED ORDER — 0.9 % SODIUM CHLORIDE (POUR BTL) OPTIME
TOPICAL | Status: DC | PRN
  Administered 2012-07-04: 1000 mL

## 2012-07-04 MED ORDER — ROCURONIUM BROMIDE 100 MG/10ML IV SOLN
INTRAVENOUS | Status: DC | PRN
  Administered 2012-07-04: 50 mg via INTRAVENOUS

## 2012-07-04 MED ORDER — HYDROCODONE-ACETAMINOPHEN 10-325 MG PO TABS
1.0000 | ORAL_TABLET | Freq: Four times a day (QID) | ORAL | Status: DC | PRN
  Administered 2012-07-04: 1 via ORAL
  Filled 2012-07-04: qty 1

## 2012-07-04 MED ORDER — HYDROMORPHONE HCL PF 1 MG/ML IJ SOLN: 1.0000 mg | Freq: Once | INTRAMUSCULAR | Status: DC

## 2012-07-04 MED ORDER — METHOCARBAMOL 500 MG PO TABS
500.0000 mg | ORAL_TABLET | Freq: Four times a day (QID) | ORAL | Status: DC | PRN
  Administered 2012-07-05 – 2012-07-06 (×5): 500 mg via ORAL
  Filled 2012-07-04 (×5): qty 1

## 2012-07-04 MED ORDER — LIDOCAINE HCL (CARDIAC) 20 MG/ML IV SOLN
INTRAVENOUS | Status: DC | PRN
  Administered 2012-07-04: 50 mg via INTRAVENOUS

## 2012-07-04 SURGICAL SUPPLY — 67 items
BANDAGE ELASTIC 4 VELCRO ST LF (GAUZE/BANDAGES/DRESSINGS) ×2 IMPLANT
BANDAGE ELASTIC 6 VELCRO ST LF (GAUZE/BANDAGES/DRESSINGS) ×2 IMPLANT
BANDAGE GAUZE ELAST BULKY 4 IN (GAUZE/BANDAGES/DRESSINGS) ×2 IMPLANT
BIT DRILL CALIBRATED 4.3MMX365 (DRILL) IMPLANT
BIT DRILL CROWE PNT TWST 4.5MM (DRILL) IMPLANT
BLADE SURG 10 STRL SS (BLADE) ×1 IMPLANT
CLEANER TIP ELECTROSURG 2X2 (MISCELLANEOUS) ×2 IMPLANT
CLOTH BEACON ORANGE TIMEOUT ST (SAFETY) ×2 IMPLANT
COVER MAYO STAND STRL (DRAPES) ×1 IMPLANT
COVER SURGICAL LIGHT HANDLE (MISCELLANEOUS) ×2 IMPLANT
CUFF TOURNIQUET SINGLE 34IN LL (TOURNIQUET CUFF) IMPLANT
CUFF TOURNIQUET SINGLE 44IN (TOURNIQUET CUFF) IMPLANT
DRAPE C-ARM 42X72 X-RAY (DRAPES) ×2 IMPLANT
DRAPE C-ARMOR (DRAPES) ×1 IMPLANT
DRAPE PROXIMA HALF (DRAPES) ×2 IMPLANT
DRAPE U-SHAPE 47X51 STRL (DRAPES) ×2 IMPLANT
DRILL CALIBRATED 4.3MMX365 (DRILL) ×2
DRILL CROWE POINT TWIST 4.5MM (DRILL) ×2
DRSG ADAPTIC 3X8 NADH LF (GAUZE/BANDAGES/DRESSINGS) ×1 IMPLANT
DRSG EMULSION OIL 3X3 NADH (GAUZE/BANDAGES/DRESSINGS) ×2 IMPLANT
DRSG MEPILEX BORDER 4X4 (GAUZE/BANDAGES/DRESSINGS) ×1 IMPLANT
DRSG PAD ABDOMINAL 8X10 ST (GAUZE/BANDAGES/DRESSINGS) ×1 IMPLANT
DURAPREP 26ML APPLICATOR (WOUND CARE) ×2 IMPLANT
ELECT REM PT RETURN 9FT ADLT (ELECTROSURGICAL) ×2
ELECTRODE REM PT RTRN 9FT ADLT (ELECTROSURGICAL) ×1 IMPLANT
GLOVE BIO SURGEON STRL SZ8.5 (GLOVE) ×2 IMPLANT
GLOVE BIOGEL PI IND STRL 8 (GLOVE) ×1 IMPLANT
GLOVE BIOGEL PI IND STRL 8.5 (GLOVE) ×1 IMPLANT
GLOVE BIOGEL PI INDICATOR 8 (GLOVE) ×1
GLOVE BIOGEL PI INDICATOR 8.5 (GLOVE) ×1
GLOVE SS BIOGEL STRL SZ 8 (GLOVE) ×1 IMPLANT
GLOVE SUPERSENSE BIOGEL SZ 8 (GLOVE) ×1
GOWN PREVENTION PLUS XXLARGE (GOWN DISPOSABLE) ×2 IMPLANT
GOWN STRL NON-REIN LRG LVL3 (GOWN DISPOSABLE) ×4 IMPLANT
GUIDEPIN 3.2X17.5 THRD DISP (PIN) ×1 IMPLANT
GUIDEWIRE BEAD TIP (WIRE) ×1 IMPLANT
KIT BASIN OR (CUSTOM PROCEDURE TRAY) ×2 IMPLANT
KIT ROOM TURNOVER OR (KITS) ×2 IMPLANT
MANIFOLD NEPTUNE II (INSTRUMENTS) ×1 IMPLANT
NDL HYPO 21X1.5 SAFETY (NEEDLE) ×1 IMPLANT
NDL HYPO 25GX1X1/2 BEV (NEEDLE) ×1 IMPLANT
NEEDLE HYPO 21X1.5 SAFETY (NEEDLE) IMPLANT
NEEDLE HYPO 25GX1X1/2 BEV (NEEDLE) IMPLANT
NS IRRIG 1000ML POUR BTL (IV SOLUTION) ×2 IMPLANT
PACK ORTHO EXTREMITY (CUSTOM PROCEDURE TRAY) ×2 IMPLANT
PAD ARMBOARD 7.5X6 YLW CONV (MISCELLANEOUS) ×4 IMPLANT
PENCIL BUTTON HOLSTER BLD 10FT (ELECTRODE) ×1 IMPLANT
SCREW CORT TI DBL LEAD 5X32 (Screw) ×1 IMPLANT
SCREW CORT TI DBL LEAD 5X56 (Screw) ×1 IMPLANT
SCREW CORT TI DBL LEAD 5X60 (Screw) ×1 IMPLANT
SCREW CORT TI DBL LEAD 5X70 (Screw) ×1 IMPLANT
SCREW CORT TI DBL LEAD 5X80 (Screw) ×1 IMPLANT
SPONGE GAUZE 4X4 12PLY (GAUZE/BANDAGES/DRESSINGS) ×2 IMPLANT
SPONGE LAP 18X18 X RAY DECT (DISPOSABLE) ×2 IMPLANT
STAPLER VISISTAT 35W (STAPLE) ×2 IMPLANT
SUCTION FRAZIER TIP 10 FR DISP (SUCTIONS) ×2 IMPLANT
SUT VIC AB 0 CT1 27 (SUTURE) ×2
SUT VIC AB 0 CT1 27XBRD ANBCTR (SUTURE) ×1 IMPLANT
SUT VIC AB 2-0 CT1 27 (SUTURE) ×2
SUT VIC AB 2-0 CT1 TAPERPNT 27 (SUTURE) ×1 IMPLANT
SYR BULB IRRIGATION 50ML (SYRINGE) ×2 IMPLANT
SYR CONTROL 10ML LL (SYRINGE) ×1 IMPLANT
TOWEL OR 17X24 6PK STRL BLUE (TOWEL DISPOSABLE) ×2 IMPLANT
TOWEL OR 17X26 10 PK STRL BLUE (TOWEL DISPOSABLE) ×2 IMPLANT
TUBE CONNECTING 12X1/4 (SUCTIONS) ×2 IMPLANT
WATER STERILE IRR 1000ML POUR (IV SOLUTION) ×1 IMPLANT
femoral nail - retrograde 13.5mm x 380mm ×1 IMPLANT

## 2012-07-04 NOTE — Progress Notes (Signed)
Comfortable vss  set for surg todsy

## 2012-07-04 NOTE — Transfer of Care (Signed)
Immediate Anesthesia Transfer of Care Note  Patient: Rebekah Moreno  Procedure(s) Performed: Procedure(s): INTRAMEDULLARY (IM) RETROGRADE FEMORAL NAILING (Right)  Patient Location: PACU  Anesthesia Type:General  Level of Consciousness: awake  Airway & Oxygen Therapy: Patient Spontanous Breathing and Patient connected to face mask  Post-op Assessment: Report given to PACU RN and Post -op Vital signs reviewed and stable  Post vital signs: Reviewed and stable  Complications: No apparent anesthesia complications and O2 saturation on nasal cannula decreased to 70's, on rebreather mask saturation high 80's, then placed on non-rebreather. with adequate saturation

## 2012-07-04 NOTE — Anesthesia Preprocedure Evaluation (Addendum)
Anesthesia Evaluation  Patient identified by MRN, date of birth, ID band Patient awake    Reviewed: Allergy & Precautions, H&P , NPO status , Patient's Chart, lab work & pertinent test results  Airway Mallampati: I  Neck ROM: Full    Dental  (+) Teeth Intact   Pulmonary asthma ,          Cardiovascular hypertension, Pt. on medications Rhythm:Regular Rate:Normal     Neuro/Psych    GI/Hepatic GERD-  Medicated and Controlled,  Endo/Other    Renal/GU      Musculoskeletal   Abdominal   Peds  Hematology   Anesthesia Other Findings   Reproductive/Obstetrics                          Anesthesia Physical Anesthesia Plan  ASA: II  Anesthesia Plan: General   Post-op Pain Management:    Induction: Intravenous  Airway Management Planned: Oral ETT  Additional Equipment:   Intra-op Plan:   Post-operative Plan: Extubation in OR  Informed Consent: I have reviewed the patients History and Physical, chart, labs and discussed the procedure including the risks, benefits and alternatives for the proposed anesthesia with the patient or authorized representative who has indicated his/her understanding and acceptance.   Dental advisory given  Plan Discussed with: Anesthesiologist and Surgeon  Anesthesia Plan Comments:         Anesthesia Quick Evaluation

## 2012-07-04 NOTE — Anesthesia Postprocedure Evaluation (Signed)
  Anesthesia Post-op Note  Patient: Rebekah Moreno  Procedure(s) Performed: Procedure(s): INTRAMEDULLARY (IM) RETROGRADE FEMORAL NAILING (Right)  Patient Location: PACU  Anesthesia Type:General  Level of Consciousness: awake and alert   Airway and Oxygen Therapy: Patient Spontanous Breathing and Patient connected to nasal cannula oxygen  Post-op Pain: mild  Post-op Assessment: Post-op Vital signs reviewed, Patient's Cardiovascular Status Stable, Respiratory Function Stable, Patent Airway and No signs of Nausea or vomiting  Post-op Vital Signs: Reviewed and stable  Complications: No apparent anesthesia complications

## 2012-07-04 NOTE — Op Note (Signed)
#  [id number] 

## 2012-07-04 NOTE — Interval H&P Note (Signed)
History and Physical Interval Note:  07/04/2012 2:12 PM  Rebekah Moreno  has presented today for surgery, with the diagnosis of right supracondylar femoral fracture  The various methods of treatment have been discussed with the patient and family. After consideration of risks, benefits and other options for treatment, the patient has consented to  Procedure(s): INTRAMEDULLARY (IM) RETROGRADE FEMORAL NAILING (Right) as a surgical intervention .  The patient's history has been reviewed, patient examined, no change in status, stable for surgery.  I have reviewed the patient's chart and labs.  Questions were answered to the patient's satisfaction.     Ariv Penrod G

## 2012-07-04 NOTE — Anesthesia Procedure Notes (Signed)
Procedure Name: Intubation Date/Time: 07/04/2012 2:51 PM Performed by: Alanda Amass A Pre-anesthesia Checklist: Patient identified, Timeout performed, Emergency Drugs available, Suction available and Patient being monitored Patient Re-evaluated:Patient Re-evaluated prior to inductionOxygen Delivery Method: Circle system utilized Preoxygenation: Pre-oxygenation with 100% oxygen Intubation Type: IV induction Ventilation: Mask ventilation without difficulty Laryngoscope Size: Mac and 3 Grade View: Grade IV Tube type: Oral Laser Tube: Cuffed inflated with minimal occlusive pressure - saline Tube size: 7.5 mm Number of attempts: 2 Airway Equipment and Method: Stylet and Video-laryngoscopy Placement Confirmation: ETT inserted through vocal cords under direct vision,  breath sounds checked- equal and bilateral and positive ETCO2 Secured at: 22 cm Tube secured with: Tape Dental Injury: Teeth and Oropharynx as per pre-operative assessment  Comments: Laryngoscopy #1, view of posterior vocal cords only, ETT misplaced in to esophagus, immediately recognized and removed.  Laryngoscopy #2 with video glide scope.  Good view, ETT placed correctly under direct view.

## 2012-07-04 NOTE — H&P (Signed)
Reason for Consult:   Right femur fracture Referring Physician:    Dr Carleene Cooper  Rebekah Moreno is an 67 y.o. female  Contractor known to me for right knee DJD. Was considering TKR in coming months and fell today and could not get up.  Taken to [facility] and Xray showed [place] femur fracture.  ORS consulted.  Denies antecedant chest pain and LOC.  No pain elsewhere.    Past Medical History  Diagnosis Date  . Hypertension   . Hyperlipidemia   . GERD (gastroesophageal reflux disease)   . Asthma   . Arthritis   . Colon polyps   . Compression fx, lumbar spine     l2  . Osteoporosis   . Hematuria   . Anxiety   . Peptic stricture of esophagus   . Hemorrhoids   . Cancer     Breast, sees Dr. Dara Lords     Past Surgical History  Procedure Laterality Date  . Knee arthroscopy      left  . Joint replacement      left knee  . Left knee arthroscopy   02/2001  . Total knee arthroplasty  left 12/2--3    dr s [place] Diones dean  . L3,l4 discectomy  11/2000    per Dr. Gerlene Fee   . Rhinoplasty      dr Jearld Fenton  . Hemorrhoid surgery    . Mastectomy  11/11    left breast  . Portacath placement    . Colonoscopy  11-10-07    per Dr. Russella Dar, int and ext hemorrhoids, repeat in 5 yrs   . Port-a-cath removal    . Breast surgery      left mastectomy and reconstruction    Family History  Problem Relation Age of Onset  . Arthritis    . Hypertension    . Cancer    . Stroke    . Heart disease    . Heart disease Paternal Grandmother     Social History:  reports that she has never smoked. She has never used smokeless tobacco. She reports that she drinks about 0.5 ounces of alcohol per week. She reports that she does not use illicit drugs. Lives alone but daughter in [place]  Allergies:  Allergies  Allergen Reactions  . Dust Mite Extract Other (See Comments)    Wheezing  . Mold Extract (Trichophyton Mentagrophyte) Other (See Comments)    Wheezing  . Other     Animal dandruff    Medications:  I have reviewed the patient's current medications.  Results for orders placed during the hospital encounter of 07/03/12 (from the past 48 hour(s))  CBC WITH DIFFERENTIAL     Status: Abnormal   Collection Time    07/03/12 11:28 PM      Result Value Range   WBC 9.1  4.0 - 10.5 K/uL   RBC 3.91  3.87 - 5.11 MIL/uL   Hemoglobin 11.1 (*) 12.0 - 15.0 g/dL   HCT 24.4 (*) 01.0 - 27.2 %   MCV 85.9  78.0 - 100.0 fL   MCH 28.4  26.0 - 34.0 pg   MCHC 33.0  30.0 - 36.0 g/dL   RDW 53.6  64.4 - 03.4 %   Platelets 200  150 - 400 K/uL   Neutrophils Relative 80 (*) 43 - 77 %   Neutro Abs 7.2  1.7 - 7.7 K/uL   Lymphocytes Relative 12  12 - 46 %   Lymphs Abs 1.1  0.7 -  4.0 K/uL   Monocytes Relative 5  3 - 12 %   Monocytes Absolute 0.5  0.1 - 1.0 K/uL   Eosinophils Relative 2  0 - 5 %   Eosinophils Absolute 0.2  0.0 - 0.7 K/uL   Basophils Relative 0  0 - 1 %   Basophils Absolute 0.0  0.0 - 0.1 K/uL  SAMPLE TO BLOOD BANK     Status: None   Collection Time    07/03/12 11:40 PM      Result Value Range   Blood Bank Specimen SAMPLE AVAILABLE FOR TESTING     Sample Expiration 07/04/2012      Dg Femur Right  07/03/2012  *RADIOLOGY REPORT*  Clinical Data: Status post fall  RIGHT FEMUR - 2 VIEW  Comparison: None  Findings: There is an acute, comminuted, intra-articular deformity involving the distal shaft of the right femur. Fracture line extends into the femoral notch.  There is dorsal angulation of the distal fracture fragments by approximately 45 degrees.  IMPRESSION:  1.  Comminuted, intra-articular fracture of the distal femur. 2.  Dorsal angulation of the distal fracture fragments   Original Report Authenticated By: Signa Kell, M.D.    Dg Knee 1-2 Views Right  07/03/2012  *RADIOLOGY REPORT*  Clinical Data: Status post fall  RIGHT KNEE - 1-2 VIEW  Comparison: None  Findings: The bones appear osteopenic.  There is a comminuted, intra-articular fracture involving the distal shaft of the right femur.  On  the lateral radiograph there is dorsal angulation of the distal fracture fragments by approximately 45 degrees.  IMPRESSION:  1.  Comminuted, intra-articular fracture of the distal femur. 2.  Dorsal angulation of approximately 45 degrees   Original Report Authenticated By: Signa Kell, M.D.     @ROS @ Blood pressure 140/74, pulse 104, temperature 98.1 F (36.7 C), temperature source Oral, resp. rate 22, SpO2 96.00%.  PHYSICAL EXAM:   ABD soft Neurovascular intact Sensation intact distally Intact pulses distally Dorsiflexion/Plantar flexion intact Compartment soft  Other leg and both arms move fully Head is atraumatic  ASSESSMENT:   Right [place] femur fracture and DJD knee  PLAN:   Needs ORIF of her femur in hopes of establishing stable construct for eventual TKR.  Best option is probably retrograde IM nail with supplemental interfrag screws at joint.  Will admit not and plan for surgery tomorrow afternoon.  Discussed with daughter over phone.   Clemon Devaul G 07/04/2012, 12:16 AM

## 2012-07-05 LAB — BASIC METABOLIC PANEL
BUN: 20 mg/dL (ref 6–23)
Creatinine, Ser: 0.62 mg/dL (ref 0.50–1.10)
GFR calc Af Amer: >90 mL/min (ref >90)
GFR calc non Af Amer: >90 mL/min (ref >90)

## 2012-07-05 LAB — CBC
MCHC: 33.5 g/dL (ref 30.0–36.0)
Platelets: 149 10*3/uL — ABNORMAL LOW (ref 150–400)
RDW: 14.7 % (ref 11.5–15.5)

## 2012-07-05 MED ORDER — ASPIRIN 325 MG PO TBEC: 325.0000 mg | DELAYED_RELEASE_TABLET | Freq: Two times a day (BID) | ORAL | Status: DC

## 2012-07-05 MED ORDER — HYDROCODONE-ACETAMINOPHEN 10-325 MG PO TABS: 1.0000 | ORAL_TABLET | Freq: Four times a day (QID) | ORAL | Status: DC | PRN

## 2012-07-05 MED ORDER — MUPIROCIN 2 % EX OINT
TOPICAL_OINTMENT | Freq: Two times a day (BID) | CUTANEOUS | Status: DC
  Administered 2012-07-05 – 2012-07-07 (×6): via NASAL
  Filled 2012-07-05: qty 22

## 2012-07-05 NOTE — Progress Notes (Signed)
Occupational Therapy Evaluation Patient Details Name: Rebekah Moreno MRN: [id number] DOB: 08/06/1945 Today's Date: 07/05/2012 Time: 5621-3086 OT Time Calculation (min): 47 min  OT Assessment / Plan / Recommendation Clinical Impression  67 yo s/p fall with R supracondylar femur fx. ORIF. KI at all times. TDWB. Pt will benefit from SNF for rehab to return to PLOF. Pt will benefit from skilled OT services to max independence with ADL and mobility to facilitate D/C to snf.     OT Assessment  Patient needs continued OT Services    Follow Up Recommendations  SNF    Barriers to Discharge Decreased caregiver support    Equipment Recommendations  3 in 1 bedside comode;Tub/shower bench    Recommendations for Other Services    Frequency  Min 2X/week    Precautions / Restrictions Precautions Precautions: Fall Required Braces or Orthoses: Knee Immobilizer - Right Knee Immobilizer - Right: On at all times Restrictions Weight Bearing Restrictions: Yes RLE Weight Bearing: Touchdown weight bearing   Pertinent Vitals/Pain 3. Leg. Repositioned. Ice to leg O2 SATS 95 1.5 L. HR 98    ADL  Grooming: Performed;Set up Where Assessed - Grooming: Supported sitting Upper Body Bathing: Performed;Set up Where Assessed - Upper Body Bathing: Supported sitting Lower Body Bathing: Maximal assistance Where Assessed - Lower Body Bathing: Supported sit to stand Upper Body Dressing: Set up Where Assessed - Upper Body Dressing: Supported sitting Lower Body Dressing: Maximal assistance Where Assessed - Lower Body Dressing: Supported sit to Pharmacist, hospital: Moderate assistance Toilet Transfer Method: Sit to stand;Stand pivot Toilet Transfer Equipment: Bedside commode Toileting - Clothing Manipulation and Hygiene: Maximal assistance Where Assessed - Toileting Clothing Manipulation and Hygiene: Sit to stand from 3-in-1 or toilet Equipment Used: Gait belt;Knee Immobilizer;Rolling  walker Transfers/Ambulation Related to ADLs: Mod A. RW levl ADL Comments: Pt very anxious about session. Completed ADL/toileting with pt. Pt very motivated after session. works well with calm reassuring feedback.    OT Diagnosis: Generalized weakness;Acute pain  OT Problem List: Decreased strength;Decreased range of motion;Decreased activity tolerance;Impaired balance (sitting and/or standing);Decreased knowledge of use of DME or AE;Decreased knowledge of precautions;Cardiopulmonary status limiting activity;Obesity;Pain OT Treatment Interventions: Self-care/ADL training;Therapeutic exercise;Energy conservation;DME and/or AE instruction;Therapeutic activities;Patient/family education;Balance training   OT Goals Acute Rehab OT Goals OT Goal Formulation: With patient Time For Goal Achievement: 07/19/12 Potential to Achieve Goals: Good ADL Goals Pt Will Perform Lower Body Bathing: with min assist;with set-up;Sitting, chair;Supported;with adaptive equipment;Other (comment) (lat leans) ADL Goal: Lower Body Bathing - Progress: Goal set today Pt Will Transfer to Toilet: with min assist;with DME;Ambulation;3-in-1;Maintaining weight bearing status ADL Goal: Toilet Transfer - Progress: Goal set today Pt Will Perform Toileting - Clothing Manipulation: with min assist;Sitting on 3-in-1 or toilet;Standing ADL Goal: Toileting - Clothing Manipulation - Progress: Goal set today Pt Will Perform Toileting - Hygiene: Sit to stand from 3-in-1/toilet;with supervision ADL Goal: Toileting - Hygiene - Progress: Goal set today Additional ADL Goal #1: Pt will complete bed mobility in preparation for ADL with Min A ADL Goal: Additional Goal #1 - Progress: Goal set today  Visit Information  Last OT Received On: 07/05/12 Assistance Needed: +1    Subjective Data   You helped me so much   Prior Functioning     Home Living Lives With: Alone Available Help at Discharge:  (SNF) Type of Home: House Home Access:  Stairs to enter [employer] of Steps: 2 Entrance Stairs-Rails: None Home Layout: One level Firefighter: Standard Home Adaptive Equipment: Crutches Prior  Function Level of Independence: Independent Able to Take Stairs?: Yes Driving: Yes Vocation: Retired Musician: No difficulties Dominant Hand: Right         Vision/Perception Vision - History Baseline Vision: Wears glasses all the time   Cognition  Cognition Overall Cognitive Status: Appears within functional limits for tasks assessed/performed Arousal/Alertness: Awake/alert Orientation Level: Appears intact for tasks assessed Behavior During Session: Anxious    Extremity/Trunk Assessment Right Upper Extremity Assessment RUE ROM/Strength/Tone: Within functional levels Left Upper Extremity Assessment LUE ROM/Strength/Tone: Within functional levels Right Lower Extremity Assessment RLE ROM/Strength/Tone: Deficits;Unable to fully assess;Due to pain;Due to precautions Left Lower Extremity Assessment LLE ROM/Strength/Tone: Within functional levels Trunk Assessment Trunk Assessment: Normal     Mobility Bed Mobility Bed Mobility: Not assessed Transfers Transfers: Sit to Stand;Stand to Sit Sit to Stand: 3: Mod assist;With upper extremity assist;From chair/3-in-1 Stand to Sit: With upper extremity assist;To chair/3-in-1;3: Mod assist Details for Transfer Assistance: able to maintain WBS during mobility     Exercise     Balance  Min A   End of Session OT - End of Session Equipment Utilized During Treatment: Gait belt;Right knee immobilizer Activity Tolerance: Patient tolerated treatment well Patient left: in chair;with call bell/phone within reach;with family/visitor present Nurse Communication: Mobility status;Weight bearing status;Precautions  GO     Jamian Andujo,HILLARY 07/05/2012, 7:03 PM [facility], OTR/L  [phone] 07/05/2012

## 2012-07-05 NOTE — Progress Notes (Signed)
Subjective: 1 Day Post-Op Procedure(s) (LRB): INTRAMEDULLARY (IM) RETROGRADE FEMORAL NAILING (Right)  Activity level:  nwb right leg Diet tolerance:  eating Voiding:  ok Patient reports pain as 3 on 0-10 scale.    Objective: Vital signs in last 24 hours: Temp:  [97.2 F (36.2 C)-98.9 F (37.2 C)] 98.9 F (37.2 C) (02/16 0510) Pulse Rate:  [89-108] 93 (02/16 0510) Resp:  [14-23] 18 (02/16 0510) BP: (120-181)/(69-92) 133/73 mmHg (02/16 0510) SpO2:  [89 %-100 %] 96 % (02/16 0510) FiO2 (%):  [40 %-50 %] 40 % (02/15 1830)  Labs:  Recent Labs  07/03/12 2328 07/05/12 0445  HGB 11.1* 9.4*    Recent Labs  07/03/12 2328 07/05/12 0445  WBC 9.1 6.1  RBC 3.91 3.26*  HCT 33.6* 28.1*  PLT 200 149*    Recent Labs  07/03/12 2328 07/05/12 0445  NA 140 136  K 4.3 4.1  CL 102 100  CO2 30 25  BUN 25* 20  CREATININE 0.80 0.62  GLUCOSE 144* 195*  CALCIUM 9.3 8.6   No results found for this basename: LABPT, INR,  in the last 72 hours  Physical Exam:  Neurologically intact ABD soft Neurovascular intact Sensation intact distally Intact pulses distally Dorsiflexion/Plantar flexion intact Incision: dressing C/D/I No cellulitis present Compartment soft  Assessment/Plan:  1 Day Post-Op Procedure(s) (LRB): INTRAMEDULLARY (IM) RETROGRADE FEMORAL NAILING (Right) Advance diet Up with therapy D/C IV fluids Discharge to SNF when bed found  - mon or tues ASA 325 BID x 2- 4 weeks NWB  r leg    Ladell Lea R 07/05/2012, 11:56 AM

## 2012-07-05 NOTE — Evaluation (Signed)
Physical Therapy Evaluation Patient Details Name: Rebekah Moreno MRN: [id number] DOB: [birth year] Today's Date: 07/05/2012 Time: 4098-1191 PT Time Calculation (min): 25 min  PT Assessment / Plan / Recommendation Clinical Impression  Pt was scheduled for a R TKA this summer prior to falling on the ice Friday (07-03-12) and sustaining a R femur fx.  She underwent ORIF of R femur 07-04-12.  Acute PT indicated to initiate mobility training and provide education.  Pt lives alone and will need ST SNF for continiued PT prior to d/c home.    PT Assessment  Patient needs continued PT services    Follow Up Recommendations  CIR    Does the patient have the potential to tolerate intense rehabilitation      Barriers to Discharge Decreased caregiver support      Equipment Recommendations  Rolling walker with 5" wheels    Recommendations for Other Services     Frequency Min 5X/week    Precautions / Restrictions Precautions Required Braces or Orthoses: Knee Immobilizer - Right Knee Immobilizer - Right: On at all times Restrictions Weight Bearing Restrictions: Yes RLE Weight Bearing: Touchdown weight bearing   Pertinent Vitals/Pain 6/10      Mobility  Bed Mobility Bed Mobility: Supine to Sit Supine to Sit: 3: Mod assist;With rails Details for Bed Mobility Assistance: verbal cues for sequencing, assist with RLE Transfers Transfers: Sit to Stand;Stand to Sit;Stand Pivot Transfers Sit to Stand: 3: Mod assist;With upper extremity assist;From bed Stand to Sit: 4: Min assist;With upper extremity assist;To chair/3-in-1 Stand Pivot Transfers: 4: Min assist Details for Transfer Assistance: RW used for SPT, verbal cues for sequencing, pt able to maintain TDWB throughout stance Ambulation/Gait Assistive device: Rolling walker    Exercises     PT Diagnosis: Difficulty walking;Acute pain  PT Problem List: Decreased activity tolerance;Decreased balance;Decreased mobility;Decreased knowledge  of use of DME;Pain PT Treatment Interventions: DME instruction;Gait training;Functional mobility training;Therapeutic activities;Patient/family education;Therapeutic exercise   PT Goals Acute Rehab PT Goals PT Goal Formulation: With patient Time For Goal Achievement: 07/12/12 Potential to Achieve Goals: Good Pt will go Supine/Side to Sit: with supervision;with rail PT Goal: Supine/Side to Sit - Progress: Goal set today Pt will go Sit to Supine/Side: with supervision;with rail PT Goal: Sit to Supine/Side - Progress: Goal set today Pt will go Sit to Stand: with supervision;with upper extremity assist PT Goal: Sit to Stand - Progress: Goal set today Pt will go Stand to Sit: with supervision;with upper extremity assist PT Goal: Stand to Sit - Progress: Goal set today Pt will Ambulate: 16 - 50 feet;with rolling walker;with supervision PT Goal: Ambulate - Progress: Goal set today  Visit Information  Last PT Received On: 07/05/12 Assistance Needed: +1    Subjective Data  Subjective: "I am nervous about moving." Patient Stated Goal: have her R knee replaced after healing   Prior Functioning  Home Living Lives With: Alone Type of Home: House Home Access: Stairs to enter Secretary/administrator of Steps: 2 Entrance Stairs-Rails: None Home Layout: One level Firefighter: Standard Home Adaptive Equipment: Crutches Prior Function Level of Independence: Independent Able to Take Stairs?: Yes Driving: Yes Vocation: Retired Musician: No difficulties    Copywriter, advertising Overall Cognitive Status: Appears within functional limits for tasks assessed/performed Arousal/Alertness: Awake/alert Orientation Level: Appears intact for tasks assessed Behavior During Session: [facility] for tasks performed    Extremity/Trunk Assessment     Balance    End of Session PT - End of Session Equipment Utilized During  Treatment: Gait belt;Right knee immobilizer Activity Tolerance:  Other (comment) (anxiety, HR increased to 130s) Patient left: in chair;with call bell/phone within reach;with family/visitor present Nurse Communication: Mobility status  GP     Ilda Foil 07/05/2012, 11:52 AM  Aida Raider, PT  Office # [phone] Pager [phone]

## 2012-07-05 NOTE — Op Note (Signed)
Rebekah Moreno, Rebekah Moreno             ACCOUNT NO.:  [id number]  MEDICAL RECORD NO.:  [id number]  LOCATION:  5N29C                        FACILITY:  [facility]  PHYSICIAN:  Lubertha Basque. Dionte Blaustein, M.D.DATE OF BIRTH:  February 21, 1946  DATE OF PROCEDURE:  07/04/2012 DATE OF DISCHARGE:                              OPERATIVE REPORT   PREOPERATIVE DIAGNOSIS:  Right supracondylar femur fracture.  POSTOPERATIVE DIAGNOSIS:  Right supracondylar femur fracture.  PROCEDURE:  Right femur ORIF.  ANESTHESIA:  General.  ATTENDING SURGEON:  Velna Ochs, MD.  ASSISTANT:  Lindwood Qua, PA.  INDICATION FOR PROCEDURE:  The patient is a 67 year old woman with some significant baseline knee pain related to arthritis.  She was actually contemplating a knee replacement operation, but unfortunately slipped on ice yesterday and fell.  She sustained a supracondylar femur fracture which had an intra-articular extension to the knee joint.  She is offered ORIF in hopes of getting her femur to heal and also in hopes of some day allowing her to have a knee replacement.  Informed operative consent was obtained after discussion of possible complications including reaction to anesthesia, infection, nonunion, malunion, and DVT.  SUMMARY OF FINDINGS AND PROCEDURE:  Under general anesthesia, through a small incision, we performed ORIF of the right distal femur using a retrograde nail by [employer].  Placed a [place] retro nail size 13.5 x 38. We locked it distally with four screws and proximally with one.  Use of fluoroscopy throughout the case to make appropriate intraoperative decisions and read all of these views myself.  Lindwood Qua, PA, assisted throughout and was valuable to the completion of the case mostly helped maintain reduction while I placed the hardware.  She was closed primarily, admitted back to the orthopedic surgery service for appropriate care.  DESCRIPTION OF PROCEDURE:  The patient's operating  suite where general anesthetic was applied without difficulty.  She was positioned in supine and prepped draped in normal sterile fashion.  After administration of IV Kefzol, an appropriate time-out.  We placed her right leg on a triangle.  I then used a [employer] clamp percutaneously to reduce the intra-articular split and stabilize this.  We placed a second Darrick Penna to reduce the fracture site.  This was checked on AP and lateral views. I made an incision just medial to the patellar tendon with dissection down to the intercondylar notch.  I placed a guidewire fluoroscopically up the shaft and then over reamed to a diameter of 14.5 mm.  This was followed by placement of the aforementioned retrograde nail.  Using the attached guide, we placed four locking screws in the distal portion.  We seemed to get good purchase and then also tightened the locking screw in the shaft of the device.  We then placed one percutaneous anterior-to- posterior locking screw proximally.  Fluoroscopy was used to confirm adequate placement of hardware and reduction of the fracture.  I read these views myself.  The reduction clamps were removed and the reduction seems stable as did the splint which went down into the knee joint.  The wounds were irrigated followed by reapproximation of deep tissues with #0 Vicryl and subcutaneous tissues with 2-0 undyed Vicryl.  Skin was  then closed with staples at the multiple sites.  We placed Adaptic and dry gauze dressing with loose Ace wrap.  We did place a knee immobilizer as well.  Estimated blood loss and fluids obtained from anesthesia records.  DISPOSITION:  The patient was extubated in the operating room and taken to recovery in stable condition.  She was to be admitted back to the orthopedic surgery service for appropriate postop care to include perioperative antibiotic and aspirin for DVT prophylaxis along with immediate mobilization.     Lubertha Basque [name],  M.D.     PGD/MEDQ  D:  07/04/2012  T:  07/04/2012  Job:  [id number]

## 2012-07-06 LAB — CBC
Platelets: 143 10*3/uL — ABNORMAL LOW (ref 150–400)
RBC: 3.16 MIL/uL — ABNORMAL LOW (ref 3.87–5.11)
WBC: 6 10*3/uL (ref 4.0–10.5)

## 2012-07-06 NOTE — Progress Notes (Signed)
Subjective: 2 Days Post-Op Procedure(s) (LRB): INTRAMEDULLARY (IM) RETROGRADE FEMORAL NAILING (Right)  Activity level:  OOB with PT Diet tolerance:  regular Voiding:  Well in BR Patient reports pain as moderate.    Objective: Vital signs in last 24 hours: Temp:  [97.8 F (36.6 C)-98.4 F (36.9 C)] 98.2 F (36.8 C) (02/17 0624) Pulse Rate:  [90-97] 90 (02/17 0624) Resp:  [16-18] 16 (02/17 0624) BP: (119-141)/(58-66) 119/58 mmHg (02/17 0624) SpO2:  [95 %-99 %] 99 % (02/17 0624)  Labs:  Recent Labs  07/03/12 2328 07/05/12 0445 07/06/12 0906  HGB 11.1* 9.4* 9.1*    Recent Labs  07/05/12 0445 07/06/12 0906  WBC 6.1 6.0  RBC 3.26* 3.16*  HCT 28.1* 27.2*  PLT 149* 143*    Recent Labs  07/03/12 2328 07/05/12 0445  NA 140 136  K 4.3 4.1  CL 102 100  CO2 30 25  BUN 25* 20  CREATININE 0.80 0.62  GLUCOSE 144* 195*  CALCIUM 9.3 8.6   No results found for this basename: LABPT, INR,  in the last 72 hours  Physical Exam:  ABD soft Neurovascular intact Sensation intact distally Intact pulses distally Dorsiflexion/Plantar flexion intact Incision: no drainage  Assessment/Plan:  2 Days Post-Op Procedure(s) (LRB): INTRAMEDULLARY (IM) RETROGRADE FEMORAL NAILING (Right)  Dressing changed Up with therapy Discharge to SNF and meeting with case managers this morning    Jahshua Bonito G 07/06/2012, 9:54 AM

## 2012-07-06 NOTE — Clinical Social Work Psychosocial (Addendum)
Clinical Social Work Department  BRIEF PSYCHOSOCIAL ASSESSMENT  Patient: Rebekah Moreno Account Number: [id number]  Admit date: 07/03/12 Clinical Social Worker Sabino Niemann, MSW Date/Time: 07/06/2012 2:30 PM Referred by: Physician Date Referred: 07/06/2012 Referred for   SNF Placement   Other Referral:  Interview type: Patient  Other interview type: PSYCHOSOCIAL DATA  Living Status:alone Admitted from facility:  Level of care:  Primary support name: Stout,Stephanie   Primary support relationship to patient: daughter Degree of support available:  Strong and vested  CURRENT CONCERNS  Current Concerns   Post-Acute Placement   Other Concerns:  SOCIAL WORK ASSESSMENT / PLAN  CSW met with pt re: PT recommendation for SNF.   Pt lives alone. Patient's daughter lives in [place]  CSW explained placement process and answered questions.   Pt reports [facility] as her preference    CSW completed FL2 and initiated SNF search.     Assessment/plan status: Information/Referral to [facility]  Other assessment/ plan:  Information/referral to community resources:  SNF   PTAR   PATIENT'S/FAMILY'S RESPONSE TO PLAN OF CARE:  Pt reports she is agreeable to ST SNF in order to increase strength and independence with mobility prior to return home  Pt verbalized understanding of placement process and appreciation for SW assist.   Sabino Niemann, MSW [phone]

## 2012-07-06 NOTE — Progress Notes (Signed)
UR COMPLETED  

## 2012-07-06 NOTE — Progress Notes (Signed)
Physical Therapy Treatment Patient Details Name: TIRZA SENTENO MRN: [id number] DOB: 1946-03-08 Today's Date: 07/06/2012 Time: 4098-1191 PT Time Calculation (min): 24 min  PT Assessment / Plan / Recommendation Comments on Treatment Session  Patient s/p ORIF of Rt. femur fracture.  Progressing with mobility.  Agree with need for SNF at discharge to continue therapy.    Follow Up Recommendations  SNF     Does the patient have the potential to tolerate intense rehabilitation     Barriers to Discharge        Equipment Recommendations  Rolling walker with 5" wheels    Recommendations for Other Services    Frequency Min 5X/week   Plan Frequency remains appropriate;Discharge plan needs to be updated    Precautions / Restrictions Precautions Precautions: Fall Required Braces or Orthoses: Knee Immobilizer - Right Knee Immobilizer - Right: On at all times Restrictions Weight Bearing Restrictions: Yes RLE Weight Bearing: Touchdown weight bearing   Pertinent Vitals/Pain Pain limiting mobility.    Mobility  Bed Mobility Bed Mobility: Not assessed Transfers Transfers: Sit to Stand;Stand to Sit Sit to Stand: 4: Min assist;With upper extremity assist;With armrests;From chair/3-in-1 Stand to Sit: 4: Min assist;With upper extremity assist;With armrests;To chair/3-in-1 Details for Transfer Assistance: Verbal cues for hand placement on chair and 3-in-1.  Cues for TDWB statuse for RLE (patient maintaining NWB - does not want to put foot on floor). Ambulation/Gait Ambulation/Gait Assistance: 4: Min assist Ambulation Distance (Feet): 52 Feet Assistive device: Rolling walker Ambulation/Gait Assistance Details: Verbal cues for safe use of RW and gait sequence.  Encouraged patient to touch RLE to floor for balance.  Patient felt more comfortable using NWB gait pattern due to pain in RLE.  Required 2 standing rest breaks during gait. Gait Pattern: Step-to pattern;Decreased step length -  left Gait velocity: Slow gait speed    Exercises General Exercises - Lower Extremity Ankle Circles/Pumps: AROM;Both;10 reps;Seated     PT Goals Acute Rehab PT Goals PT Goal: Sit to Stand - Progress: Progressing toward goal PT Goal: Stand to Sit - Progress: Progressing toward goal PT Goal: Ambulate - Progress: Progressing toward goal  Visit Information  Last PT Received On: 07/06/12 Assistance Needed: +1    Subjective Data  Subjective: "I went into the bathroom this morning with nurses"  Patient proud of her progress.   Cognition  Cognition Overall Cognitive Status: Appears within functional limits for tasks assessed/performed Arousal/Alertness: Awake/alert Orientation Level: Appears intact for tasks assessed Behavior During Session: Anxious    Balance     End of Session PT - End of Session Equipment Utilized During Treatment: Gait belt;Right knee immobilizer Activity Tolerance: Patient limited by fatigue;Patient limited by pain Patient left: in chair;with call bell/phone within reach;with family/visitor present Nurse Communication: Mobility status   GP     Vena Austria 07/06/2012, 5:18 PM Durenda Hurt. Renaldo Fiddler, [facility] Acute Rehab Services Pager [phone]

## 2012-07-06 NOTE — ED Provider Notes (Signed)
Medical screening examination/treatment/procedure(s) were conducted as a shared visit with non-physician practitioner(s) and myself.  I personally evaluated the patient during the encounter 67 yo woman fell and injured the right thigh. Exam shows tenderness over the right distal femur. The skin is intact. X-rays show a fracture of the right distal femur with angulation of about 45 degrees. Call to Marcene Corning, M.D., her orthopedist, who will be in to see her.       Carleene Cooper III, MD 07/06/12 [phone]

## 2012-07-06 NOTE — Clinical Social Work Placement (Addendum)
Clinical Social Work Department  CLINICAL SOCIAL WORK PLACEMENT NOTE  04/26/2012  Patient:  Account Number:   Admit date: 07/03/12 Clinical Social Worker: Sabino Niemann LCSWA Date/time: 07/06/2012 2:35 AM  Clinical Social Work is seeking post-discharge placement for this patient at the following level of care: SKILLED NURSING (*CSW will update this form in Epic as items are completed)  07/06/2012 Patient/family provided with [facility] Department of Clinical Social Work's list of facilities offering this level of care within the geographic area requested by the patient (or if unable, by the patient's family).  07/06/2012 Patient/family informed of their freedom to choose among providers that offer the needed level of care, that participate in Medicare, Medicaid or managed care program needed by the patient, have an available bed and are willing to accept the patient.  07/06/2012 Patient/family informed of [facility]' ownership interest in [facility], as well as of the fact that they are under no obligation to receive care at this facility.  PASARR submitted to EDS on   PASARR number received from EDS on   FL2 transmitted to all facilities in geographic area requested by pt/family on 07/06/2012 FL2 transmitted to all facilities within larger geographic area on  Patient informed that his/her managed care company has contracts with or will negotiate with certain facilities, including the following:  Patient/family informed of bed offers received: 07/06/2012 Patient chooses bed at  Physician recommends and patient chooses bed at [facility] Patient to be transferred to on 07/07/12 Patient to be transferred to facility by PTAR The following physician request were entered in Epic:  Additional Comments:  Sabino Niemann, MSW, [phone]

## 2012-07-06 NOTE — Progress Notes (Addendum)
Inpatient Diabetes Program Recommendations  AACE/ADA: New Consensus Statement on Inpatient Glycemic Control (2013)  Target Ranges:  Prepandial:   less than 140 mg/dL      Peak postprandial:   less than 180 mg/dL (1-2 hours)      Critically ill patients:  140 - 180 mg/dL   Reason for Visit: Elevated lab glucose of 195 @ 0445 on 2/16 No documented history of diabetes Please check HgbA1C (?Could be result from IV with dextrose??   Note: Thank you, Lenor Coffin, RN, CNS, Diabetes Coordinator [phone])

## 2012-07-06 NOTE — Discharge Summary (Addendum)
Patient ID: Rebekah Moreno MRN: [id number] DOB/AGE: [birth year] 67 y.o.  Admit date: 07/03/2012 Discharge date: 07/07/2012  Admission Diagnoses:  Principal Problem:   Closed supracondylar fracture of right femur   Discharge Diagnoses:  Same  Past Medical History  Diagnosis Date  . Hypertension   . Hyperlipidemia   . GERD (gastroesophageal reflux disease)   . Asthma   . Arthritis   . Colon polyps   . Compression fx, lumbar spine     l2  . Osteoporosis   . Hematuria   . Anxiety   . Peptic stricture of esophagus   . Hemorrhoids   . Cancer     Breast, sees Dr. Dara Lords     Surgeries: Procedure(s): INTRAMEDULLARY (IM) RETROGRADE FEMORAL NAILING on 07/03/2012 - 07/04/2012   Consultants:    Discharged Condition: Improved  Hospital Course: Rebekah Moreno is an 67 y.o. female who was admitted 07/03/2012 for operative treatment ofClosed supracondylar fracture of right femur. Patient has severe unremitting pain that affects sleep, daily activities, and work/hobbies. After pre-op clearance the patient was taken to the operating room on 07/03/2012 - 07/04/2012 and underwent  Procedure(s): INTRAMEDULLARY (IM) RETROGRADE FEMORAL NAILING.  ASA 325 Bid to prevent DVT. SCD as well.  Patient was given perioperative antibiotics: Anti-infectives   Start     Dose/Rate Route Frequency Ordered Stop   07/04/12 2100  ceFAZolin (ANCEF) IVPB 2 g/50 mL premix     2 g 100 mL/hr over 30 Minutes Intravenous Every 6 hours 07/04/12 2046 07/05/12 9562       Patient was given sequential compression devices, early ambulation, and chemoprophylaxis to prevent DVT.  Patient benefited maximally from hospital stay and there were no complications.    Recent vital signs: Patient Vitals for the past 24 hrs:  BP Temp Pulse Resp SpO2  07/06/12 0624 119/58 mmHg 98.2 F (36.8 C) 90 16 99 %  07/05/12 2331 120/62 mmHg 98.4 F (36.9 C) 96 18 95 %  07/05/12 1341 141/66 mmHg 97.8 F (36.6 C) 97 18 98 %      Recent laboratory studies:  Recent Labs  07/03/12 2328 07/05/12 0445  WBC 9.1 6.1  HGB 11.1* 9.4*  HCT 33.6* 28.1*  PLT 200 149*  NA 140 136  K 4.3 4.1  CL 102 100  CO2 30 25  BUN 25* 20  CREATININE 0.80 0.62  GLUCOSE 144* 195*  CALCIUM 9.3 8.6     Discharge Medications:     Medication List    STOP taking these medications       celecoxib 200 MG capsule  Commonly known as:  CELEBREX     ibuprofen 200 MG tablet  Commonly known as:  ADVIL,MOTRIN      TAKE these medications       ALPRAZolam 0.5 MG tablet  Commonly known as:  XANAX  Take 0.5 mg by mouth 3 (three) times daily as needed for sleep or anxiety.     aspirin 325 MG EC tablet  Take 1 tablet (325 mg total) by mouth 2 (two) times daily.     CENTRUM ULTRA WOMENS Tabs  Take 1 tablet by mouth daily.     cyclobenzaprine 10 MG tablet  Commonly known as:  FLEXERIL  Take 10 mg by mouth 3 (three) times daily as needed for muscle spasms.     enalapril-hydrochlorothiazide 10-25 MG per tablet  Commonly known as:  VASERETIC  Take 1 tablet by mouth daily.     esomeprazole 40 MG  capsule  Commonly known as:  NEXIUM  Take 40 mg by mouth daily before breakfast.     HYDROcodone-acetaminophen 10-325 MG per tablet  Commonly known as:  NORCO  Take 1 tablet by mouth every 6 (six) hours as needed for pain.     sertraline 100 MG tablet  Commonly known as:  ZOLOFT  Take 100 mg by mouth daily.     simvastatin 40 MG tablet  Commonly known as:  ZOCOR  Take 40 mg by mouth every evening.      Pt should be NWB right leg with brace on Cont ASA x 4 weeks RTO in 2 weeks Zofran 4 mg tablets 1 po q 6 prn n/v Diagnostic Studies: Dg Femur Right  07/04/2012  *RADIOLOGY REPORT*  Clinical Data: Internal fixation of right femur fracture.  DG C-ARM 61-120 MIN,RIGHT FEMUR - 2 VIEW  Comparison:  07/03/2012  Findings: Four intraoperative spot views of the right femur are submitted postoperatively for interpretation. An  intramedullary rod within the right femur is noted with proximal and distal transfixing screws.  This rod traverses the distal femur fracture in near anatomic alignment and position.  No complicating hardware features are identified.  IMPRESSION: Internal fixation of distal femur fracture, in near anatomic alignment and position.  No definite complicating features.   Original Report Authenticated By: Harmon Pier, M.D.    Dg Femur Right  07/03/2012  *RADIOLOGY REPORT*  Clinical Data: Status post fall  RIGHT FEMUR - 2 VIEW  Comparison: None  Findings: There is an acute, comminuted, intra-articular deformity involving the distal shaft of the right femur. Fracture line extends into the femoral notch.  There is dorsal angulation of the distal fracture fragments by approximately 45 degrees.  IMPRESSION:  1.  Comminuted, intra-articular fracture of the distal femur. 2.  Dorsal angulation of the distal fracture fragments   Original Report Authenticated By: Signa Kell, M.D.    Dg Knee 1-2 Views Right  07/03/2012  *RADIOLOGY REPORT*  Clinical Data: Status post fall  RIGHT KNEE - 1-2 VIEW  Comparison: None  Findings: The bones appear osteopenic.  There is a comminuted, intra-articular fracture involving the distal shaft of the right femur.  On the lateral radiograph there is dorsal angulation of the distal fracture fragments by approximately 45 degrees.  IMPRESSION:  1.  Comminuted, intra-articular fracture of the distal femur. 2.  Dorsal angulation of approximately 45 degrees   Original Report Authenticated By: Signa Kell, M.D.    Dg Chest Port 1 View  07/04/2012  *RADIOLOGY REPORT*  Clinical Data: Preop ORIF of femur fracture  PORTABLE CHEST - 1 VIEW  Comparison: 03/17/2012  Findings: Normal heart size.  No pleural effusion or edema.  There is no airspace consolidation.  Hiatal hernia is noted.  The visualized osseous structures appear intact.  IMPRESSION:  1.  No acute cardiopulmonary abnormalities. 2.  Hiatal  hernia.   Original Report Authenticated By: Signa Kell, M.D.    Dg C-arm [phone] Min  07/04/2012  *RADIOLOGY REPORT*  Clinical Data: Internal fixation of right femur fracture.  DG C-ARM 61-120 MIN,RIGHT FEMUR - 2 VIEW  Comparison:  07/03/2012  Findings: Four intraoperative spot views of the right femur are submitted postoperatively for interpretation. An intramedullary rod within the right femur is noted with proximal and distal transfixing screws.  This rod traverses the distal femur fracture in near anatomic alignment and position.  No complicating hardware features are identified.  IMPRESSION: Internal fixation of distal femur fracture, in near  anatomic alignment and position.  No definite complicating features.   Original Report Authenticated By: Harmon Pier, M.D.     Disposition: 01-Home or Self Care       Future Appointments Provider Department Dept Phone   09/09/2012 9:30 AM Delcie Roch [facility] MEDICAL ONCOLOGY [phone]   09/09/2012 10:00 AM Victorino December, MD [facility] MEDICAL ONCOLOGY [phone]      Follow-up Information   Follow up with DALLDORF,PETER G, MD. Call in 12 days.   Contact information:   [address] [place] [address] [phone]        Signed: Prince Rome 07/06/2012, 7:28 AM

## 2012-07-07 MED ORDER — ONDANSETRON HCL 4 MG PO TABS: 4.0000 mg | ORAL_TABLET | Freq: Four times a day (QID) | ORAL | Status: DC | PRN

## 2012-07-07 MED ORDER — ALPRAZOLAM 0.5 MG PO TABS: 0.5000 mg | ORAL_TABLET | Freq: Three times a day (TID) | ORAL | Status: DC | PRN

## 2012-07-07 NOTE — Progress Notes (Signed)
Subjective: 3 Days Post-Op Procedure(s) (LRB): INTRAMEDULLARY (IM) RETROGRADE FEMORAL NAILING (Right) Doing well Activity level:  nwb right leg Diet tolerance:  ok Voiding:  ok Patient reports pain as 3 on 0-10 scale.    Objective: Vital signs in last 24 hours: Temp:  [97.9 F (36.6 C)-98.6 F (37 C)] 98.4 F (36.9 C) (02/18 0553) Pulse Rate:  [90-106] 90 (02/18 0553) Resp:  [18] 18 (02/18 0553) BP: (130-150)/(63-82) 150/75 mmHg (02/18 0553) SpO2:  [94 %-100 %] 96 % (02/18 0553)  Labs:  Recent Labs  07/05/12 0445 07/06/12 0906  HGB 9.4* 9.1*    Recent Labs  07/05/12 0445 07/06/12 0906  WBC 6.1 6.0  RBC 3.26* 3.16*  HCT 28.1* 27.2*  PLT 149* 143*    Recent Labs  07/05/12 0445  NA 136  K 4.1  CL 100  CO2 25  BUN 20  CREATININE 0.62  GLUCOSE 195*  CALCIUM 8.6   No results found for this basename: LABPT, INR,  in the last 72 hours  Physical Exam:  Neurologically intact ABD soft Neurovascular intact Sensation intact distally Intact pulses distally Dorsiflexion/Plantar flexion intact Incision: dressing C/D/I No cellulitis present Compartment soft  Assessment/Plan:  3 Days Post-Op Procedure(s) (LRB): INTRAMEDULLARY (IM) RETROGRADE FEMORAL NAILING (Right) Advance diet Up with therapy D/C IV fluids Discharge to SNF ASA 325 BiD for 2-4 weeks to prevent dvt NWB r leg    Rebekah Moreno R 07/07/2012, 9:38 AM

## 2012-07-07 NOTE — Progress Notes (Signed)
Pt d/c to [facility] this afternoon per EMS without issue.  Report called and given to receiving nurse.

## 2012-07-10 ENCOUNTER — Encounter (HOSPITAL_COMMUNITY): Payer: Self-pay | Admitting: Orthopaedic Surgery

## 2012-07-30 ENCOUNTER — Inpatient Hospital Stay: Admit: 2012-07-30 | Payer: Medicare HMO | Admitting: Orthopaedic Surgery

## 2012-07-30 SURGERY — ARTHROPLASTY, KNEE, TOTAL
Anesthesia: Choice | Laterality: Right

## 2012-09-07 ENCOUNTER — Ambulatory Visit (INDEPENDENT_AMBULATORY_CARE_PROVIDER_SITE_OTHER): Payer: Medicare HMO | Admitting: General Surgery

## 2012-09-07 ENCOUNTER — Encounter (INDEPENDENT_AMBULATORY_CARE_PROVIDER_SITE_OTHER): Payer: Self-pay | Admitting: General Surgery

## 2012-09-07 VITALS — BP 150/90 | HR 90 | Temp 96.7°F | Ht 67.0 in | Wt 206.0 lb

## 2012-09-07 DIAGNOSIS — Z853 Personal history of malignant neoplasm of breast: Secondary | ICD-10-CM

## 2012-09-07 NOTE — Progress Notes (Signed)
Operation:  Left mastectomy and reconstruction  Date:  November 2011  Final pathology: T1N0   Hormone receptor status: Negative  HPI: She is here for followup of her left breast cancer.  She denies left chest wall or right breast masses.  No adenopathy.  She recently had surgery for a broken right femur. PE: Rebekah Moreno looks well and is in no acute distress  Right breast-no visible or palpable abnormalities.  Left breast-she has a reconstructed nipple and her breast reconstruction looks good.. No nodules around the incision or native skin.  Lymph nodes-no palpable cervical, supraclavicular, or axillary adenopathy.  Assessment:  Left breast cancer-no clinical evidence of recurrence.    Plan: Return visit 6 months.

## 2012-09-07 NOTE — Patient Instructions (Signed)
Call if you feel any new breast lumps or chest wall nodules.

## 2012-09-09 ENCOUNTER — Telehealth: Payer: Self-pay | Admitting: Oncology

## 2012-09-09 ENCOUNTER — Telehealth: Payer: Self-pay | Admitting: Medical Oncology

## 2012-09-09 ENCOUNTER — Encounter: Payer: Self-pay | Admitting: Oncology

## 2012-09-09 ENCOUNTER — Ambulatory Visit (HOSPITAL_BASED_OUTPATIENT_CLINIC_OR_DEPARTMENT_OTHER): Payer: Medicare HMO | Admitting: Oncology

## 2012-09-09 ENCOUNTER — Other Ambulatory Visit (HOSPITAL_BASED_OUTPATIENT_CLINIC_OR_DEPARTMENT_OTHER): Payer: Medicare HMO | Admitting: Lab

## 2012-09-09 VITALS — BP 163/80 | HR 83 | Temp 98.1°F | Resp 20 | Ht 67.0 in | Wt 204.6 lb

## 2012-09-09 DIAGNOSIS — F341 Dysthymic disorder: Secondary | ICD-10-CM

## 2012-09-09 DIAGNOSIS — I10 Essential (primary) hypertension: Secondary | ICD-10-CM

## 2012-09-09 DIAGNOSIS — Z171 Estrogen receptor negative status [ER-]: Secondary | ICD-10-CM

## 2012-09-09 DIAGNOSIS — C50219 Malignant neoplasm of upper-inner quadrant of unspecified female breast: Secondary | ICD-10-CM

## 2012-09-09 DIAGNOSIS — Z853 Personal history of malignant neoplasm of breast: Secondary | ICD-10-CM

## 2012-09-09 LAB — CBC WITH DIFFERENTIAL/PLATELET
BASO%: 1 % (ref 0.0–2.0)
Basophils Absolute: 0 10*3/uL (ref 0.0–0.1)
HCT: 35.5 % (ref 34.8–46.6)
HGB: 11.6 g/dL (ref 11.6–15.9)
MCHC: 32.6 g/dL (ref 31.5–36.0)
MONO#: 0.3 10*3/uL (ref 0.1–0.9)
NEUT%: 70.2 % (ref 38.4–76.8)
WBC: 4.8 10*3/uL (ref 3.9–10.3)
lymph#: 0.9 10*3/uL (ref 0.9–3.3)

## 2012-09-09 LAB — COMPREHENSIVE METABOLIC PANEL (CC13)
ALT: 16 U/L (ref 0–55)
Albumin: 3.7 g/dL (ref 3.5–5.0)
CO2: 29 mEq/L (ref 22–29)
Calcium: 9.5 mg/dL (ref 8.4–10.4)
Chloride: 104 mEq/L (ref 98–107)
Creatinine: 0.9 mg/dL (ref 0.6–1.1)

## 2012-09-09 MED ORDER — POTASSIUM CHLORIDE CRYS ER 20 MEQ PO TBCR: 20.0000 meq | EXTENDED_RELEASE_TABLET | Freq: Every day | ORAL | Status: DC

## 2012-09-09 NOTE — Telephone Encounter (Signed)
Message copied by Rexene Edison on Wed Sep 09, 2012  4:30 PM ------      Message from: Laural Golden      Created: Wed Sep 09, 2012  3:54 PM       Please call patient and inform her that her potassium is 3.3.            Call in K dur po daily x 5 days to her pharmacy.       Disp 5, no refills.            Thanks,       L      ----- Message -----         From: [facility] Interface         Sent: 09/09/2012   9:44 AM           To: Victorino December, MD                   ------

## 2012-09-09 NOTE — Telephone Encounter (Signed)
LVMOM. Per NP, informed patient of her potassium level 3.3 and that NP would like for her to take Kdur 20 meq by mouth daily for 5 days and that the prescription has been sent to her listed pharmacy. Asked pt to call back confirming receipt of message.

## 2012-09-09 NOTE — Progress Notes (Signed)
OFFICE PROGRESS NOTE  CC Rebekah Moreno Dr. Richarda Overlie Dr. Enriqueta Shutter, MD [address] [place] [address]  DIAGNOSIS: 67 year old female with multifocal invasive ductal carcinoma of the left breast status post left modified radical mastectomy with axillary lymph node dissection performed in November 2011. The tumor was ER negative PR negative HER-2/neu negative with a proliferation marker 18%  PRIOR THERAPY:  #1 patient underwent a mastectomy of the left breast with axillary lymph node dissection November 2011. She was found to have multifocal disease. The node dissection was negative for metastatic disease. Tumor was ER negative PR negative HER-2/neu negative proliferation marker 18%. She had immediate reconstruction.  #2 patient then received 4 cycles of adjuvant chemotherapy initially consisting of dose dense Adriamycin and Cytoxan from 04/25/2010 to 06/06/2010.  #3 she then received Taxol and carboplatinum from 06/21/2010 to 08/06/2010. She received a total of 7 cycles of combination. Patient developed peripheral paresthesias and severe myelo suppression. Thus carboplatin was discontinued and she went on to complete single agent Taxol from 08/20/2010 2 09/20/2010. Patient completed a total of 12 weeks of taxane containing regimen  #3 patient has undergone reconstruction by Dr. Etter Sjogren.  CURRENT THERAPY:Observation  INTERVAL HISTORY: [name] 67 y.o. female returns for Followup visit today. Clinically and oncological he she is doing well.She is working full-time she does complain of some fatigue. She has had a little bit of residual neuropathy off-and-on. But nothing consistent. She denies any headaches double vision blurring of vision no fevers chills or night sweats. She has no bleeding no chest pains no shortness of breath. She does complain of fatigue. We discussed exercise today. Remainder of the 10 point review of systems is  negative.  MEDICAL HISTORY: Past Medical History  Diagnosis Date  . Hypertension   . Hyperlipidemia   . GERD (gastroesophageal reflux disease)   . Asthma   . Arthritis   . Colon polyps   . Compression fx, lumbar spine     l2  . Osteoporosis   . Hematuria   . Anxiety   . Peptic stricture of esophagus   . Hemorrhoids   . Cancer     Breast, sees Dr. Dara Lords     ALLERGIES:  is allergic to dust mite extract; mold extract; and other.  MEDICATIONS:  Current Outpatient Prescriptions  Medication Sig Dispense Refill  . ALPRAZolam (XANAX) 0.5 MG tablet Take 1 tablet (0.5 mg total) by mouth 3 (three) times daily as needed for sleep or anxiety.  40 tablet  0  . enalapril-hydrochlorothiazide (VASERETIC) 10-25 MG per tablet Take 1 tablet by mouth daily.      Marland Kitchen esomeprazole (NEXIUM) 40 MG capsule Take 40 mg by mouth daily before breakfast.      . HYDROcodone-acetaminophen (NORCO) 10-325 MG per tablet Take 1 tablet by mouth every 6 (six) hours as needed for pain.  60 tablet  0  . Multiple Vitamins-Minerals (CENTRUM ULTRA WOMENS) TABS Take 1 tablet by mouth daily.        . sertraline (ZOLOFT) 100 MG tablet Take 100 mg by mouth daily.      . simvastatin (ZOCOR) 40 MG tablet Take 40 mg by mouth every evening.      Marland Kitchen aspirin EC 325 MG EC tablet Take 1 tablet (325 mg total) by mouth 2 (two) times daily.  30 tablet  1   No current facility-administered medications for this visit.    SURGICAL HISTORY:  Past Surgical History  Procedure Laterality Date  . Knee arthroscopy      left  . Joint replacement      left knee  . Left knee arthroscopy   02/2001  . Total knee arthroplasty  left 12/2--3    dr s Livingston Diones dean  . L3,l4 discectomy  11/2000    per Dr. Gerlene Fee   . Rhinoplasty      dr Jearld Fenton  . Hemorrhoid surgery    . Mastectomy  11/11    left breast  . Portacath placement    . Colonoscopy  11-10-07    per Dr. Russella Dar, int and ext hemorrhoids, repeat in 5 yrs   . Port-a-cath removal    .  Breast surgery      left mastectomy and reconstruction  . Femur im nail Right 07/04/2012    Procedure: INTRAMEDULLARY (IM) RETROGRADE FEMORAL NAILING;  Surgeon: Velna Ochs, MD;  Location: [facility] OR;  Service: Orthopedics;  Laterality: Right;    REVIEW OF SYSTEMS:  Pertinent items are noted in HPI.   PHYSICAL EXAMINATION: General appearance: alert, cooperative and appears stated age Head: Normocephalic, without obvious abnormality, atraumatic Neck: no adenopathy, no carotid bruit, no JVD, supple, symmetrical, trachea midline and thyroid not enlarged, symmetric, no tenderness/mass/nodules Lymph nodes: Cervical, supraclavicular, and axillary nodes normal. Resp: clear to auscultation bilaterally and normal percussion bilaterally Back: symmetric, no curvature. ROM normal. No CVA tenderness. Cardio: regular rate and rhythm, S1, S2 normal, no murmur, click, rub or gallop and normal apical impulse GI: soft, non-tender; bowel sounds normal; no masses,  no organomegaly Extremities: extremities normal, atraumatic, no cyanosis or edema Neurologic: Alert and oriented X 3, normal strength and tone. Normal symmetric reflexes. Normal coordination and gait Breast examination right breast no masses temple discharge no nipple retraction. Left reconstructed breast looks well healed there is no overlying skin changes she does have a reconstructed nipple present ECOG PERFORMANCE STATUS: 0 - Asymptomatic  Blood pressure 163/80, pulse 83, temperature 98.1 F (36.7 C), temperature source Oral, resp. rate 20, height 5\' 7"  (1.702 m), weight 204 lb 9.6 oz (92.806 kg).  LABORATORY DATA: Lab Results  Component Value Date   WBC 4.8 09/09/2012   HGB 11.6 09/09/2012   HCT 35.5 09/09/2012   MCV 82.1 09/09/2012   PLT 195 09/09/2012      Chemistry      Component Value Date/Time   NA 143 09/09/2012 0935   NA 136 07/05/2012 0445   K 3.3* 09/09/2012 0935   K 4.1 07/05/2012 0445   CL 104 09/09/2012 0935   CL 100 07/05/2012  0445   CO2 29 09/09/2012 0935   CO2 25 07/05/2012 0445   BUN 19.5 09/09/2012 0935   BUN 20 07/05/2012 0445   CREATININE 0.9 09/09/2012 0935   CREATININE 0.62 07/05/2012 0445      Component Value Date/Time   CALCIUM 9.5 09/09/2012 0935   CALCIUM 8.6 07/05/2012 0445   ALKPHOS 130 09/09/2012 0935   ALKPHOS 116 07/03/2012 2328   AST 19 09/09/2012 0935   AST 19 07/03/2012 2328   ALT 16 09/09/2012 0935   ALT 15 07/03/2012 2328   BILITOT 0.37 09/09/2012 0935   BILITOT 0.2* 07/03/2012 2328       RADIOGRAPHIC STUDIES:  No results found.  ASSESSMENT: 67 year old female with  #1 multifocal invasive ductal carcinoma of the left breast status post modified radical mastectomy with axillary lymph node dissection performed in November 2011. Her final pathology showed a triple negative disease.She is status post  adjuvant chemotherapy consisting of initially Adriamycin Cytoxan followed by Taxol carboplatinum.  #2 she has a reconstructed left breast.  #3 bilateral arthritis of the knees.  #4 depression and anxiety.  #5 hypercholesterolemia  #6 hypertension  #7 Caroline fracture of the right leg status post rodding.   PLAN:   #1 overall patient is doing well she has no evidence of recurrent disease. She is up to date on her mammograms.  #2 I will plan on seeing her back in 4 months time.  #3 exercise and diet are discussed. And stress reduction.   All questions were answered. The patient knows to call the clinic with any problems, questions or concerns. We can certainly see the patient much sooner if necessary.  I spent 25 minutes counseling the patient face to face. The total time spent in the appointment was 30 minutes.    Drue Second, MD Medical/Oncology [facility] [place] [phone] (beeper) [phone] (Office)  09/09/2012, 11:00 AM

## 2012-09-09 NOTE — Patient Instructions (Addendum)
Doing well you remain cancer free  I will see you back in 4 months  Foods Rich in Potassium Food / Potassium (mg)  Apricots, dried,  cup / 378 mg   Apricots, raw, 1 cup halves / 401 mg   Avocado,  / 487 mg   Banana, 1 large / 487 mg   Beef, lean, round, 3 oz / 202 mg   Cantaloupe, 1 cup cubes / 427 mg   Dates, medjool, 5 whole / 835 mg   Ham, cured, 3 oz / 212 mg   Lentils, dried,  cup / 458 mg   Lima beans, frozen,  cup / 258 mg   Orange, 1 large / 333 mg   Orange juice, 1 cup / 443 mg   Peaches, dried,  cup / 398 mg   Peas, split, cooked,  cup / 355 mg   Potato, boiled, 1 medium / 515 mg   Prunes, dried, uncooked,  cup / 318 mg   Raisins,  cup / 309 mg   Salmon, pink, raw, 3 oz / 275 mg   Sardines, canned , 3 oz / 338 mg   Tomato, raw, 1 medium / 292 mg   Tomato juice, 6 oz / 417 mg   [place], 3 oz / 349 mg  Document Released: 05/06/2005 Document Revised: 01/16/2011 Document Reviewed: 09/19/2008 [facility] Patient Information 2012 [place], [place].

## 2012-09-15 ENCOUNTER — Other Ambulatory Visit: Payer: Self-pay | Admitting: Family Medicine

## 2012-09-24 ENCOUNTER — Other Ambulatory Visit: Payer: Self-pay | Admitting: Family Medicine

## 2012-09-25 ENCOUNTER — Telehealth: Payer: Self-pay | Admitting: *Deleted

## 2012-09-25 MED ORDER — ALPRAZOLAM 0.5 MG PO TABS: 0.5000 mg | ORAL_TABLET | Freq: Three times a day (TID) | ORAL | Status: DC | PRN

## 2012-09-25 MED ORDER — CELECOXIB 200 MG PO CAPS: 200.0000 mg | ORAL_CAPSULE | Freq: Two times a day (BID) | ORAL | Status: DC

## 2012-09-25 NOTE — Telephone Encounter (Signed)
Call in Xanax #[phone] with 5 rf, also refill Celebrex for one year

## 2012-09-25 NOTE — Telephone Encounter (Signed)
Patient is requesting a refill of Alprazolam and Celebrex. Is this okay to fill?

## 2012-10-15 ENCOUNTER — Encounter: Payer: Self-pay | Admitting: Gastroenterology

## 2012-10-17 ENCOUNTER — Other Ambulatory Visit: Payer: Self-pay | Admitting: Family Medicine

## 2012-10-23 ENCOUNTER — Other Ambulatory Visit: Payer: Self-pay | Admitting: Family Medicine

## 2012-10-26 ENCOUNTER — Other Ambulatory Visit: Payer: Self-pay | Admitting: Family Medicine

## 2012-10-26 NOTE — Telephone Encounter (Signed)
Can we refill this? 

## 2012-11-14 ENCOUNTER — Other Ambulatory Visit: Payer: Self-pay | Admitting: Family Medicine

## 2012-11-16 ENCOUNTER — Other Ambulatory Visit: Payer: Self-pay | Admitting: Family Medicine

## 2012-11-16 NOTE — Telephone Encounter (Signed)
Can we refill this and if so, is this the correct dose?

## 2012-12-21 ENCOUNTER — Telehealth: Payer: Self-pay | Admitting: Family Medicine

## 2012-12-21 MED ORDER — SIMVASTATIN 40 MG PO TABS: 40.0000 mg | ORAL_TABLET | Freq: Every evening | ORAL | Status: DC

## 2012-12-21 NOTE — Telephone Encounter (Signed)
Refill request for Simvastatin and I did send e-scribe.

## 2013-01-13 ENCOUNTER — Encounter: Payer: Self-pay | Admitting: Oncology

## 2013-01-13 ENCOUNTER — Telehealth: Payer: Self-pay | Admitting: Oncology

## 2013-01-13 ENCOUNTER — Other Ambulatory Visit (HOSPITAL_BASED_OUTPATIENT_CLINIC_OR_DEPARTMENT_OTHER): Payer: Medicare HMO | Admitting: Lab

## 2013-01-13 ENCOUNTER — Ambulatory Visit (HOSPITAL_BASED_OUTPATIENT_CLINIC_OR_DEPARTMENT_OTHER): Payer: Medicare HMO | Admitting: Oncology

## 2013-01-13 VITALS — BP 169/85 | HR 82 | Temp 97.8°F | Resp 20 | Ht 67.0 in | Wt 201.4 lb

## 2013-01-13 DIAGNOSIS — IMO0002 Reserved for concepts with insufficient information to code with codable children: Secondary | ICD-10-CM

## 2013-01-13 DIAGNOSIS — Z853 Personal history of malignant neoplasm of breast: Secondary | ICD-10-CM

## 2013-01-13 DIAGNOSIS — M1710 Unilateral primary osteoarthritis, unspecified knee: Secondary | ICD-10-CM

## 2013-01-13 DIAGNOSIS — F411 Generalized anxiety disorder: Secondary | ICD-10-CM

## 2013-01-13 DIAGNOSIS — F329 Major depressive disorder, single episode, unspecified: Secondary | ICD-10-CM

## 2013-01-13 LAB — CBC WITH DIFFERENTIAL/PLATELET
BASO%: 0.6 % (ref 0.0–2.0)
HCT: 36.6 % (ref 34.8–46.6)
MCHC: 32.5 g/dL (ref 31.5–36.0)
MONO#: 0.3 10*3/uL (ref 0.1–0.9)
RBC: 4.44 10*6/uL (ref 3.70–5.45)
RDW: 14.8 % — ABNORMAL HIGH (ref 11.2–14.5)
WBC: 5.1 10*3/uL (ref 3.9–10.3)
lymph#: 1 10*3/uL (ref 0.9–3.3)
nRBC: 0 % (ref 0–0)

## 2013-01-13 LAB — COMPREHENSIVE METABOLIC PANEL (CC13)
ALT: 17 U/L (ref 0–55)
AST: 18 U/L (ref 5–34)
Albumin: 3.8 g/dL (ref 3.5–5.0)
Calcium: 9.2 mg/dL (ref 8.4–10.4)
Chloride: 108 mEq/L (ref 98–109)
Potassium: 3.7 mEq/L (ref 3.5–5.1)
Sodium: 142 mEq/L (ref 136–145)

## 2013-01-13 NOTE — Patient Instructions (Addendum)
You're doing very well. There is no evidence of recurrent cancer.  I will see you back in 3 months time for followup.  Please make an appointment for a mammogram in September

## 2013-01-15 ENCOUNTER — Telehealth: Payer: Self-pay | Admitting: Family Medicine

## 2013-01-15 NOTE — Telephone Encounter (Signed)
Pt would like Dr Clent Ridges to see her mother as new pt. Pt has medicare primary. Can I sch?

## 2013-01-15 NOTE — Telephone Encounter (Signed)
I am sorry but I am not able to see her. I would suggest [place] or Dr. Selena Batten

## 2013-01-18 NOTE — Progress Notes (Signed)
OFFICE PROGRESS NOTE  CC Rebekah Moreno Dr. Richarda Overlie Dr. Enriqueta Shutter, MD [address] [place] [address]  DIAGNOSIS: 67 year old female with multifocal invasive ductal carcinoma of the left breast status post left modified radical mastectomy with axillary lymph node dissection performed in November 2011. The tumor was ER negative PR negative HER-2/neu negative with a proliferation marker 18%  PRIOR THERAPY:  #1 patient underwent a mastectomy of the left breast with axillary lymph node dissection November 2011. She was found to have multifocal disease. The node dissection was negative for metastatic disease. Tumor was ER negative PR negative HER-2/neu negative proliferation marker 18%. She had immediate reconstruction.  #2 patient then received 4 cycles of adjuvant chemotherapy initially consisting of dose dense Adriamycin and Cytoxan from 04/25/2010 to 06/06/2010.  #3 she then received Taxol and carboplatinum from 06/21/2010 to 08/06/2010. She received a total of 7 cycles of combination. Patient developed peripheral paresthesias and severe myelo suppression. Thus carboplatin was discontinued and she went on to complete single agent Taxol from 08/20/2010 2 09/20/2010. Patient completed a total of 12 weeks of taxane containing regimen  #3 patient has undergone reconstruction by Dr. Etter Sjogren.  CURRENT THERAPY:Observation  INTERVAL HISTORY: Rebekah Moreno 67 y.o. female returns for Followup visit today. Clinically and oncological he she is doing well.She is working full-time she does complain of some fatigue. She has had a little bit of residual neuropathy off-and-on. But nothing consistent. She denies any headaches double vision blurring of vision no fevers chills or night sweats. She has no bleeding no chest pains no shortness of breath. She does complain of fatigue. We discussed exercise today. Remainder of the 10 point review of systems is  negative.  MEDICAL HISTORY: Past Medical History  Diagnosis Date  . Hypertension   . Hyperlipidemia   . GERD (gastroesophageal reflux disease)   . Asthma   . Arthritis   . Colon polyps   . Compression fx, lumbar spine     l2  . Osteoporosis   . Hematuria   . Anxiety   . Peptic stricture of esophagus   . Hemorrhoids   . Cancer     Breast, sees Dr. Dara Moreno     ALLERGIES:  is allergic to dust mite extract; mold extract; and other.  MEDICATIONS:  Current Outpatient Prescriptions  Medication Sig Dispense Refill  . ALPRAZolam (XANAX) 0.5 MG tablet Take 1 tablet (0.5 mg total) by mouth 3 (three) times daily as needed for sleep or anxiety.  60 tablet  5  . celecoxib (CELEBREX) 200 MG capsule Take 1 capsule (200 mg total) by mouth 2 (two) times daily.  60 capsule  11  . enalapril-hydrochlorothiazide (VASERETIC) 10-25 MG per tablet TAKE 1 TABLET BY MOUTH DAILY.  30 tablet  11  . esomeprazole (NEXIUM) 40 MG capsule Take 40 mg by mouth daily before breakfast.      . HYDROcodone-acetaminophen (NORCO) 10-325 MG per tablet Take 1 tablet by mouth every 6 (six) hours as needed for pain.  60 tablet  0  . Multiple Vitamins-Minerals (CENTRUM ULTRA WOMENS) TABS Take 1 tablet by mouth daily.        Marland Kitchen NEXIUM 40 MG capsule TAKE 1 CAPSULE EVERY DAY  30 capsule  6  . sertraline (ZOLOFT) 100 MG tablet TAKE 1 TABLET BY MOUTH DAILY.  30 tablet  9  . simvastatin (ZOCOR) 40 MG tablet Take 1 tablet (40 mg total) by mouth every evening.  30 tablet  1   No current facility-administered medications for this visit.    SURGICAL HISTORY:  Past Surgical History  Procedure Laterality Date  . Knee arthroscopy      left  . Joint replacement      left knee  . Left knee arthroscopy   02/2001  . Total knee arthroplasty  left 12/2--3    dr s [place] Diones dean  . L3,l4 discectomy  11/2000    per Dr. Gerlene Fee   . Rhinoplasty      dr Jearld Fenton  . Hemorrhoid surgery    . Mastectomy  11/11    left breast  . Portacath  placement    . Colonoscopy  11-10-07    per Dr. Russella Dar, int and ext hemorrhoids, repeat in 5 yrs   . Port-a-cath removal    . Breast surgery      left mastectomy and reconstruction  . Femur im nail Right 07/04/2012    Procedure: INTRAMEDULLARY (IM) RETROGRADE FEMORAL NAILING;  Surgeon: Velna Ochs, MD;  Location: [facility] OR;  Service: Orthopedics;  Laterality: Right;    REVIEW OF SYSTEMS:  Pertinent items are noted in HPI.   PHYSICAL EXAMINATION: General appearance: alert, cooperative and appears stated age Head: Normocephalic, without obvious abnormality, atraumatic Neck: no adenopathy, no carotid bruit, no JVD, supple, symmetrical, trachea midline and thyroid not enlarged, symmetric, no tenderness/mass/nodules Lymph nodes: Cervical, supraclavicular, and axillary nodes normal. Resp: clear to auscultation bilaterally and normal percussion bilaterally Back: symmetric, no curvature. ROM normal. No CVA tenderness. Cardio: regular rate and rhythm, S1, S2 normal, no murmur, click, rub or gallop and normal apical impulse GI: soft, non-tender; bowel sounds normal; no masses,  no organomegaly Extremities: extremities normal, atraumatic, no cyanosis or edema Neurologic: Alert and oriented X 3, normal strength and tone. Normal symmetric reflexes. Normal coordination and gait Breast examination right breast no masses temple discharge no nipple retraction. Left reconstructed breast looks well healed there is no overlying skin changes she does have a reconstructed nipple present ECOG PERFORMANCE STATUS: 0 - Asymptomatic  Blood pressure 169/85, pulse 82, temperature 97.8 F (36.6 C), temperature source Oral, resp. rate 20, height 5\' 7"  (1.702 m), weight 201 lb 6.4 oz (91.354 kg).  LABORATORY DATA: Lab Results  Component Value Date   WBC 5.1 01/13/2013   HGB 11.9 01/13/2013   HCT 36.6 01/13/2013   MCV 82.4 01/13/2013   PLT 194 01/13/2013      Chemistry      Component Value Date/Time   NA 142  01/13/2013 1122   NA 136 07/05/2012 0445   K 3.7 01/13/2013 1122   K 4.1 07/05/2012 0445   CL 104 09/09/2012 0935   CL 100 07/05/2012 0445   CO2 24 01/13/2013 1122   CO2 25 07/05/2012 0445   BUN 19.4 01/13/2013 1122   BUN 20 07/05/2012 0445   CREATININE 0.7 01/13/2013 1122   CREATININE 0.62 07/05/2012 0445      Component Value Date/Time   CALCIUM 9.2 01/13/2013 1122   CALCIUM 8.6 07/05/2012 0445   ALKPHOS 117 01/13/2013 1122   ALKPHOS 116 07/03/2012 2328   AST 18 01/13/2013 1122   AST 19 07/03/2012 2328   ALT 17 01/13/2013 1122   ALT 15 07/03/2012 2328   BILITOT 0.50 01/13/2013 1122   BILITOT 0.2* 07/03/2012 2328       RADIOGRAPHIC STUDIES:  No results found.  ASSESSMENT: 67 year old female with  #1 multifocal invasive ductal carcinoma of the left breast status post modified radical mastectomy  with axillary lymph node dissection performed in November 2011. Her final pathology showed a triple negative disease.She is status post adjuvant chemotherapy consisting of initially Adriamycin Cytoxan followed by Taxol carboplatinum.  #2 she has a reconstructed left breast.  #3 bilateral arthritis of the knees.  #4 depression and anxiety.  #5 hypercholesterolemia  #6 hypertension  #7 Hairline fracture of the right leg status post rodding.   PLAN:   #1 overall patient is doing well she has no evidence of recurrent disease. She is up to date on her mammograms.  #2 I will plan on seeing her back in 4 months time.  #3 exercise and diet are discussed. And stress reduction.   All questions were answered. The patient knows to call the clinic with any problems, questions or concerns. We can certainly see the patient much sooner if necessary.  I spent 25 minutes counseling the patient face to face. The total time spent in the appointment was 30 minutes.    Drue Second, MD Medical/Oncology [facility] [phone] (beeper) [phone] (Office)

## 2013-01-19 NOTE — Telephone Encounter (Signed)
Pt is aware.  

## 2013-01-26 ENCOUNTER — Other Ambulatory Visit: Payer: Self-pay | Admitting: Family Medicine

## 2013-02-02 ENCOUNTER — Other Ambulatory Visit: Payer: Self-pay

## 2013-02-02 DIAGNOSIS — Z1231 Encounter for screening mammogram for malignant neoplasm of breast: Secondary | ICD-10-CM

## 2013-02-23 ENCOUNTER — Ambulatory Visit
Admission: RE | Admit: 2013-02-23 | Discharge: 2013-02-23 | Disposition: A | Discharge status: Home / Self Care | Payer: Medicare HMO | Source: Ambulatory Visit

## 2013-02-23 DIAGNOSIS — Z1231 Encounter for screening mammogram for malignant neoplasm of breast: Secondary | ICD-10-CM

## 2013-03-03 ENCOUNTER — Ambulatory Visit (INDEPENDENT_AMBULATORY_CARE_PROVIDER_SITE_OTHER): Payer: Medicare HMO | Admitting: General Surgery

## 2013-03-03 ENCOUNTER — Other Ambulatory Visit: Payer: Self-pay | Admitting: Family Medicine

## 2013-03-03 ENCOUNTER — Encounter (INDEPENDENT_AMBULATORY_CARE_PROVIDER_SITE_OTHER): Payer: Self-pay | Admitting: General Surgery

## 2013-03-03 VITALS — BP 132/80 | HR 96 | Temp 98.4°F | Resp 16 | Ht 67.0 in | Wt 202.8 lb

## 2013-03-03 DIAGNOSIS — Z853 Personal history of malignant neoplasm of breast: Secondary | ICD-10-CM

## 2013-03-03 NOTE — Progress Notes (Signed)
Operation:  Left mastectomy and reconstruction  Date:  November 2011  Final pathology: T1N0   Hormone receptor status: Negative  HPI: She is here for followup of her left breast cancer.  She denies left chest wall or right breast masses.  No adenopathy. Right mammogram earlier this month did not show any masses or suspicious lesion  .PE: Rebekah Moreno looks well and is in no acute distress  Right breast-no visible or palpable abnormalities.  Left breast-she has a reconstructed nipple and her breast reconstruction looks good.. No nodules around the incision or native skin.  Lymph nodes-no palpable cervical, supraclavicular, or axillary adenopathy.  Assessment:  Left breast cancer-no clinical evidence of recurrence.    Plan: Return visit 6 months. Dr. Welton Flakes is due to see her next month. Since she she's been seen twice here, I could see her in the spring and Dr. Welton Flakes could see her in the fall.

## 2013-03-03 NOTE — Patient Instructions (Signed)
Call if you noticed any change in your breasts.

## 2013-04-12 ENCOUNTER — Other Ambulatory Visit (HOSPITAL_BASED_OUTPATIENT_CLINIC_OR_DEPARTMENT_OTHER): Payer: Medicare HMO | Admitting: Lab

## 2013-04-12 ENCOUNTER — Ambulatory Visit (HOSPITAL_BASED_OUTPATIENT_CLINIC_OR_DEPARTMENT_OTHER): Payer: Medicare HMO | Admitting: Family

## 2013-04-12 ENCOUNTER — Encounter: Payer: Self-pay | Admitting: *Deleted

## 2013-04-12 ENCOUNTER — Telehealth: Payer: Self-pay | Admitting: *Deleted

## 2013-04-12 ENCOUNTER — Encounter: Payer: Self-pay | Admitting: Oncology

## 2013-04-12 ENCOUNTER — Ambulatory Visit: Payer: Medicare HMO | Admitting: Oncology

## 2013-04-12 ENCOUNTER — Encounter: Payer: Self-pay | Admitting: Family

## 2013-04-12 VITALS — BP 146/88 | HR 106 | Temp 98.6°F | Resp 18 | Ht 67.0 in | Wt 198.4 lb

## 2013-04-12 DIAGNOSIS — R110 Nausea: Secondary | ICD-10-CM

## 2013-04-12 DIAGNOSIS — Z853 Personal history of malignant neoplasm of breast: Secondary | ICD-10-CM

## 2013-04-12 DIAGNOSIS — E876 Hypokalemia: Secondary | ICD-10-CM

## 2013-04-12 LAB — CBC WITH DIFFERENTIAL/PLATELET
BASO%: 0.8 % (ref 0.0–2.0)
Basophils Absolute: 0 10*3/uL (ref 0.0–0.1)
Eosinophils Absolute: 0.1 10*3/uL (ref 0.0–0.5)
HCT: 38 % (ref 34.8–46.6)
HGB: 12.6 g/dL (ref 11.6–15.9)
LYMPH%: 15.1 % (ref 14.0–49.7)
MCH: 27.6 pg (ref 25.1–34.0)
NEUT#: 4.1 10*3/uL (ref 1.5–6.5)
NEUT%: 75.7 % (ref 38.4–76.8)
Platelets: 231 10*3/uL (ref 145–400)
RBC: 4.55 10*6/uL (ref 3.70–5.45)
RDW: 13.9 % (ref 11.2–14.5)
lymph#: 0.8 10*3/uL — ABNORMAL LOW (ref 0.9–3.3)

## 2013-04-12 LAB — COMPREHENSIVE METABOLIC PANEL (CC13)
Albumin: 3.9 g/dL (ref 3.5–5.0)
Anion Gap: 11 mEq/L (ref 3–11)
BUN: 17.4 mg/dL (ref 7.0–26.0)
CO2: 26 mEq/L (ref 22–29)
Calcium: 9.8 mg/dL (ref 8.4–10.4)
Chloride: 104 mEq/L (ref 98–109)
Creatinine: 0.8 mg/dL (ref 0.6–1.1)
Glucose: 115 mg/dl (ref 70–140)
Potassium: 3.4 mEq/L — ABNORMAL LOW (ref 3.5–5.1)

## 2013-04-12 MED ORDER — POTASSIUM CHLORIDE CRYS ER 20 MEQ PO TBCR: 20.0000 meq | EXTENDED_RELEASE_TABLET | Freq: Every day | ORAL | Status: DC

## 2013-04-12 MED ORDER — ONDANSETRON HCL 8 MG PO TABS: 8.0000 mg | ORAL_TABLET | Freq: Three times a day (TID) | ORAL | Status: DC | PRN

## 2013-04-12 NOTE — Patient Instructions (Addendum)
Please contact us at [phone] if you have any questions or concerns.  Please continue to do well and enjoy life!!!  Get plenty of rest, drink plenty of water, exercise daily (walking), eat a balanced diet.  Take calcium 600 mg daily in addition to vitamin D3 1000 IUs daily.   Complete monthly self-breast examinations.  Have a clinical breast exam by a physician every year.  Have your mammogram completed every year.  Take potassium today.  And start eating potassium rich foods.    Potassium Rich Foods List - Foods High in Potassium If you have high blood pressure, it is recommended to maintain a diet high in potassium rich food.  Try to include as many potassium rich foods in your diet as as possible, some foods high in potassium are various fruits, vegetables, dairy foods, and fish.       List of Foods High in Potassium: Foods with Potassium  Serving Size Potassium (mg)  Apricots, dried 10 halves 407  Avocados, raw 1 ounce 180  Bananas, raw 1 cup 594  Beets, cooked 1 cup 519  Brussel sprouts, cooked 1 cup 504  Cantaloupe 1 cup 494  Dates, dry 5 dates 271  Figs, dry 2 figs 271  Kiwi fruit, raw 1 medium 252  Lima beans 1 cup 955  Melons, honeydew 1 cup 461  Milk, fat free or skim 1 cup 407  Nectarines 1 nectarine 288  Orange juice 1 cup 496  Oranges 1 orange 237  Pears (fresh) 1 pear 208  Peanuts dry roasted, unsalted 1 ounce 187  Potatoes, baked, 1 potato 1081  Prune juice 1 cup 707  Prunes, dried 1 cup 828  Raisins 1 cup 1089  Spinach, cooked 1 cup 839  Tomato products, canned sauce 1 cup 909  Winter squash 1 cup 896  Yogurt plain, skim milk 8 ounces 579  USDA Nutrient Database for Standard References, Release 15 for Potassium, K (mg)  Results for orders placed in visit on 04/12/13 (from the past 24 hour(s))  CBC WITH DIFFERENTIAL     Status: Abnormal   Collection Time    04/12/13 10:41 AM      Result Value Range   WBC 5.4  3.9 - 10.3 10e3/uL   NEUT# 4.1   1.5 - 6.5 10e3/uL   HGB 12.6  11.6 - 15.9 g/dL   HCT 78.2  95.6 - 21.3 %   Platelets 231  145 - 400 10e3/uL   MCV 83.6  79.5 - 101.0 fL   MCH 27.6  25.1 - 34.0 pg   MCHC 33.0  31.5 - 36.0 g/dL   RBC 0.86  5.78 - 4.69 10e6/uL   RDW 13.9  11.2 - 14.5 %   lymph# 0.8 (*) 0.9 - 3.3 10e3/uL   MONO# 0.3  0.1 - 0.9 10e3/uL   Eosinophils Absolute 0.1  0.0 - 0.5 10e3/uL   Basophils Absolute 0.0  0.0 - 0.1 10e3/uL   NEUT% 75.7  38.4 - 76.8 %   LYMPH% 15.1  14.0 - 49.7 %   MONO% 6.3  0.0 - 14.0 %   EOS% 2.1  0.0 - 7.0 %   BASO% 0.8  0.0 - 2.0 %   Narrative:    Performed At:  [facility]               [address]               [place], [place] [address]  COMPREHENSIVE METABOLIC PANEL (CC13)     Status: Abnormal   Collection Time    04/12/13 10:41 AM      Result Value Range   Sodium 142  136 - 145 mEq/L   Potassium 3.4 (*) 3.5 - 5.1 mEq/L   Chloride 104  98 - 109 mEq/L   CO2 26  22 - 29 mEq/L   Glucose 115  70 - 140 mg/dl   BUN 19.1  7.0 - 47.8 mg/dL   Creatinine 0.8  0.6 - 1.1 mg/dL   Total Bilirubin 2.95  0.20 - 1.20 mg/dL   Alkaline Phosphatase 125  40 - 150 U/L   AST 24  5 - 34 U/L   ALT 20  0 - 55 U/L   Total Protein 7.4  6.4 - 8.3 g/dL   Albumin 3.9  3.5 - 5.0 g/dL   Calcium 9.8  8.4 - 62.1 mg/dL   Anion Gap 11  3 - 11 mEq/L   Narrative:    Performed At:  [facility]               [address]               [place], [place] [address]

## 2013-04-12 NOTE — Progress Notes (Signed)
[place], [id number], approved ondansetron 8mg  from 04/12/13-05/19/14.

## 2013-04-12 NOTE — Telephone Encounter (Signed)
appts made and printed...td 

## 2013-04-12 NOTE — Progress Notes (Addendum)
[facility]  Telephone:([phone]) [phone] Fax:(336) [phone]  OFFICE PROGRESS NOTE   ID: Rebekah Moreno   DOB: 1945/12/27  MR#: [id number]  JYN#:[id number]   PCP: Nelwyn Salisbury, MD SU:  [name], MD GYN:   Richarda Overlie, MD GI:   Gwenlyn Saran, MD ORTHO: Marcene Corning, MD   DIAGNOSIS:   Rebekah Moreno is a 67 y.o. female with multifocal invasive ductal carcinoma of the left breast status post left modified radical mastectomy with axillary lymph node dissection performed in 03/2010. The tumor was estrogen receptor negative, progesterone receptor negative,  HER-2/neu negative, with a proliferation marker 18%.    PRIOR THERAPY: #1 Status post left breast simple mastectomy with left axillary sentinel node biopsy on 03/21/2010 for a stage I, mpT1b, pN0 (i-)(sn-), 0.7 cm multiple foci invasive ductal carcinoma, grade 2, with extensive high-grade ductal carcinoma in situ associated with comedonecrosis and microcalcifications, sclerosing papilloma and fibrocystic changes, final resection margins were clear,  estrogen receptor negative, progesterone receptor negative, Ki-67 27%, HER-2/neu no amplification, with 0/2 metastatic left axillary lymph nodes.  She underwent immediate reconstruction with Dr. Odis Luster.  #2  Adjuvant chemotherapy consisting of dose dense Adriamycin/Cytoxan x 4 cycles from 04/25/2010 to 06/06/2010.  #3 Adjuvant chemotherapy consisting of Taxol/Carboplatinum x 7 cycles from 06/21/2010 to 08/06/2010.  She developed peripheral paresthesias and severe myelo suppression and carboplatin was discontinued.  She then had adjuvant chemotherapy consisting of single agent Taxol from 08/20/2010 until 09/20/2010.  She completed a total of 12 weeks of Taxane containing chemotherapy.   CURRENT THERAPY:  Observation  INTERVAL HISTORY: Rebekah Moreno is a 67 y.o. female who returns today for followup of triple negative left breast invasive ductal carcinoma status  post mastectomy with reconstruction.  Her interval history is significant for her mother passing away from congestive heart failure on 04/02/2013.  During this time of increased stress and grief, she has complaints of nausea without emesis.  A prescription for Zofran was sent to her pharmacy.  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  She has an appointment to see her orthopedist Dr. [name] on 04/19/2013.  She states her rib pain has improved but is still tender.  Her interval history is otherwise stable.   MEDICAL HISTORY: Past Medical History  Diagnosis Date  . Hypertension   . Hyperlipidemia   . GERD (gastroesophageal reflux disease)   . Asthma   . Arthritis   . Colon polyps   . Compression fx, lumbar spine     l2  . Osteoporosis   . Hematuria   . Anxiety   . Peptic stricture of esophagus   . Hemorrhoids   . Cancer     Breast, sees Dr. Dara Lords     ALLERGIES:  Allergies  Allergen Reactions  . Dust Mite Extract Other (See Comments)    Wheezing  . Mold Extract [Trichophyton Mentagrophyte] Other (See Comments)    Wheezing  . Other     Animal dandruff    MEDICATIONS:  Current Outpatient Prescriptions  Medication Sig Dispense Refill  . ALPRAZolam (XANAX) 0.5 MG tablet Take 1 tablet (0.5 mg total) by mouth 3 (three) times daily as needed for sleep or anxiety.  60 tablet  5  . celecoxib (CELEBREX) 200 MG capsule Take 1 capsule (200 mg total) by mouth 2 (two) times daily.  60 capsule  11  . enalapril-hydrochlorothiazide (VASERETIC) 10-25 MG per tablet TAKE 1 TABLET BY MOUTH DAILY.  30 tablet  11  . Multiple Vitamins-Minerals (CENTRUM ULTRA WOMENS) TABS Take 1 tablet by mouth daily.        Marland Kitchen NEXIUM 40 MG capsule TAKE 1 CAPSULE EVERY DAY  30 capsule  6  . sertraline (ZOLOFT) 100 MG tablet TAKE 1 TABLET BY MOUTH DAILY.  30 tablet  9  . simvastatin (ZOCOR) 40 MG tablet  TAKE 1 TABLET BY MOUTH EVERY EVENING  30 tablet  1  . cyclobenzaprine (FLEXERIL) 10 MG tablet TAKE 1 TABLET BY MOUTH 3 TIMES A DAY AS NEEDED FOR MUSCLE SPASMS  60 tablet  3  . HYDROcodone-acetaminophen (NORCO) 10-325 MG per tablet Take 1 tablet by mouth every 6 (six) hours as needed for pain.  60 tablet  0  . ondansetron (ZOFRAN) 8 MG tablet Take 1 tablet (8 mg total) by mouth every 8 (eight) hours as needed for nausea or vomiting.  30 tablet  1  . potassium chloride SA (K-DUR,KLOR-CON) 20 MEQ tablet Take 1 tablet (20 mEq total) by mouth daily.  5 tablet  0   No current facility-administered medications for this visit.    SURGICAL HISTORY:  Past Surgical History  Procedure Laterality Date  . Knee arthroscopy      left  . Joint replacement      left knee  . Left knee arthroscopy   02/2001  . Total knee arthroplasty  left 12/2--3    dr s Livingston Diones dean  . L3,l4 discectomy  11/2000    per Dr. Gerlene Fee   . Rhinoplasty      dr Jearld Fenton  . Hemorrhoid surgery    . Mastectomy  11/11    left breast  . Portacath placement    . Colonoscopy  11-10-07    per Dr. Russella Dar, int and ext hemorrhoids, repeat in 5 yrs   . Port-a-cath removal    . Breast surgery      left mastectomy and reconstruction  . Femur im nail Right 07/04/2012    Procedure: INTRAMEDULLARY (IM) RETROGRADE FEMORAL NAILING;  Surgeon: Velna Ochs, MD;  Location: [facility] OR;  Service: Orthopedics;  Laterality: Right;    REVIEW OF SYSTEMS:  Pertinent items are noted in HPI.  A 10 point review of systems was completed and is negative except as noted above.  Rebekah Moreno denies any other symptomatology including  fatigue, fever or chills, headache, vision changes, swollen glands, cough or shortness of breath, chest pain or discomfort, vomiting, diarrhea, constipation, change in urinary or bowel habits, any other arthralgias/myalgias, unusual bleeding/bruising or any other symptomatology.   PHYSICAL EXAMINATION: Blood pressure 146/88, pulse 106,  temperature 98.6 F (37 C), temperature source Oral, resp. rate 18, height 5\' 7"  (1.702 m), weight 198 lb 6.4 oz (89.994 kg).  ECOG FS: 1 - Symptomatic but completely ambulatory  General appearance: Alert, cooperative, well nourished, visible distress Head: Normocephalic, without obvious abnormality, atraumatic Eyes: Arcus senilis, PERRLA, EOMI Nose: Nares, septum and mucosa are normal, no drainage or sinus tenderness Neck: No adenopathy, supple, symmetrical, trachea midline, no tenderness Resp: Clear to auscultation bilaterally, no wheezes/rales/rhonchi Cardio: Irregular rhythm, no murmur, click, rub or gallop, no edema Breasts:  Left breast reconstructed with well-healed surgical scars, no lymphadenopathy, no right breast nipple inversion, no axilla fullness, tenderness under right breast, no inframammary abnormalities or visible rib fractures, no fremitus GI: Soft, distended, non-tender, hypoactive bowel sounds, no organomegaly Skin: No rashes/lesions,  skin warm and dry, no erythematous areas, no cyanosis, blood moles on abdominal area  M/S:  Atraumatic, normal strength in all extremities, normal range of motion, no clubbing  Lymph nodes: Cervical, supraclavicular, and axillary nodes normal Neurologic: Grossly normal, cranial nerves II through XII intact, alert and oriented x 3 Psych: Appropriate affect at times, visible distress at times   LABORATORY DATA: Lab Results  Component Value Date   WBC 5.4 04/12/2013   HGB 12.6 04/12/2013   HCT 38.0 04/12/2013   MCV 83.6 04/12/2013   PLT 231 04/12/2013      Chemistry      Component Value Date/Time   NA 142 04/12/2013 1041   NA 136 07/05/2012 0445   K 3.4* 04/12/2013 1041   K 4.1 07/05/2012 0445   CL 104 09/09/2012 0935   CL 100 07/05/2012 0445   CO2 26 04/12/2013 1041   CO2 25 07/05/2012 0445   BUN 17.4 04/12/2013 1041   BUN 20 07/05/2012 0445   CREATININE 0.8 04/12/2013 1041   CREATININE 0.62 07/05/2012 0445      Component Value  Date/Time   CALCIUM 9.8 04/12/2013 1041   CALCIUM 8.6 07/05/2012 0445   ALKPHOS 125 04/12/2013 1041   ALKPHOS 116 07/03/2012 2328   AST 24 04/12/2013 1041   AST 19 07/03/2012 2328   ALT 20 04/12/2013 1041   ALT 15 07/03/2012 2328   BILITOT 0.71 04/12/2013 1041   BILITOT 0.2* 07/03/2012 2328       RADIOGRAPHIC STUDIES: Mm Digital Screening Unilat R 02/24/2013   CLINICAL DATA:  Screening.  EXAM: DIGITAL SCREENING UNILATERAL RIGHT MAMMOGRAM WITH CAD  DIGITAL BREAST TOMOSYNTHESIS  Digital breast tomosynthesis images are acquired in two projections. These images are reviewed in combination with the digital mammogram, confirming the findings below.  COMPARISON:  Previous exam(s).  ACR Breast Density Category b: There are scattered areas of fibroglandular density.  FINDINGS: There are no findings suspicious for malignancy. Images were processed with CAD.  IMPRESSION: No mammographic evidence of malignancy. A result letter of this screening mammogram will be mailed directly to the patient.  RECOMMENDATION: Screening mammogram in one year. (Code:SM-B-01Y)  BI-RADS CATEGORY  1: Negative   Electronically Signed   By: Edwin Cap M.D.   On: 02/24/2013 12:09    ASSESSMENT:   LYNDE LUDWIG is a 67 y.o. female:  #1 Status post left breast simple mastectomy with left axillary sentinel node biopsy on 03/21/2010 for a stage I, mpT1b, pN0 (i-)(sn-), 0.7 cm multiple foci invasive ductal carcinoma, grade 2, with extensive high-grade ductal carcinoma in situ associated with comedonecrosis and microcalcifications, sclerosing papilloma and fibrocystic changes, final resection margins were clear,  estrogen receptor negative, progesterone receptor negative, Ki-67 27%, HER-2/neu no amplification, with 0/2 metastatic left axillary lymph nodes.  She underwent immediate reconstruction with Dr. Odis Luster.  #2  Adjuvant chemotherapy consisting of dose dense Adriamycin/Cytoxan x 4 cycles from 04/25/2010 to 06/06/2010.  #3  Adjuvant chemotherapy consisting of Taxol/Carboplatinum x 7 cycles from 06/21/2010 to 08/06/2010.  She developed peripheral paresthesias and severe myelo suppression and carboplatin was discontinued.  She then had adjuvant chemotherapy consisting of single agent Taxol from 08/20/2010 until 09/20/2010.  She completed a total of 12 weeks of Taxane containing chemotherapy.  #4 Bilateral arthritic knees, hairline fracture of the right leg status post rods and pins - Scheduled to see orthopedist 04/19/2013. (Currently taking Celebrex, Flexeril, and Norco for discomfort)  #5 Depression and anxiety - currently taking Xanax and Zoloft as  prescribed  #6 Nausea without emesis  #7 Irregular heartbeat  #8 Hypokalemia   PLAN:  #1 An electronic prescription for Zofran 8 mg by mouth every 8 hours, when necessary for nausea was sent to the patient's pharmacy.   #2 Irregular heartbeat may be due to hypokalemia and current high level of stress and anxiety.  The patient asked to participate in stress reduction activities such as exercising (as tolerated) and doing more activities enjoyable to her.  If her irregular heartbeat continues, or she experiences any other symptomatology (i.e. dizziness, chest pain) she was asked to discuss this with her primary care physician and/or proceed to the nearest ER.  #3 An electronic prescription for  potassium chloride 20 mEq by mouth daily x 5 days was sent to the patient's pharmacy for hypokalemia.  She was also provided with a list of foods to consume rich in potassium.  #4 We plan to see Rebekah Moreno again in 4 months at which time will check laboratories of CBC, CMP and vitamin D level.  All questions answered.  Rebekah Moreno was encouraged to contact us in the interim with any questions, concerns, or problems.  Larina Bras, NP-C 04/12/2013  8:38 PM  ATTENDING'S ATTESTATION:  I personally reviewed patient's chart, examined patient myself, formulated the treatment  plan as followed.    67 year old with triple-negative. She received adjuvant chemotherapy. She is now on observation only. She has no evidence of recurrent disease. Unfortunately patient did lose her mother recently and she is quite upset. Condolences were given to her. I will continue to see the patient every 4-6 months time in followup.  Drue Second, MD Medical/Oncology [facility] [phone] (beeper) [phone] (Office)  04/21/2013, 10:04 AM

## 2013-04-12 NOTE — Progress Notes (Signed)
RECEIVED A FAX FROM [facility] CONCERNING A PRIOR AUTHORIZATION FOR ONDANSETRON. THIS REQUEST WAS PLACED IN THE MANAGED CARE BIN. 

## 2013-04-13 IMAGING — CR DG CHEST 2V
2 series · 2 of 2 positions shown · non-contrast
Comparison: 12/18/2011

CLINICAL DATA: Pain post fall

CHEST - 2 VIEW

[w chest lat]
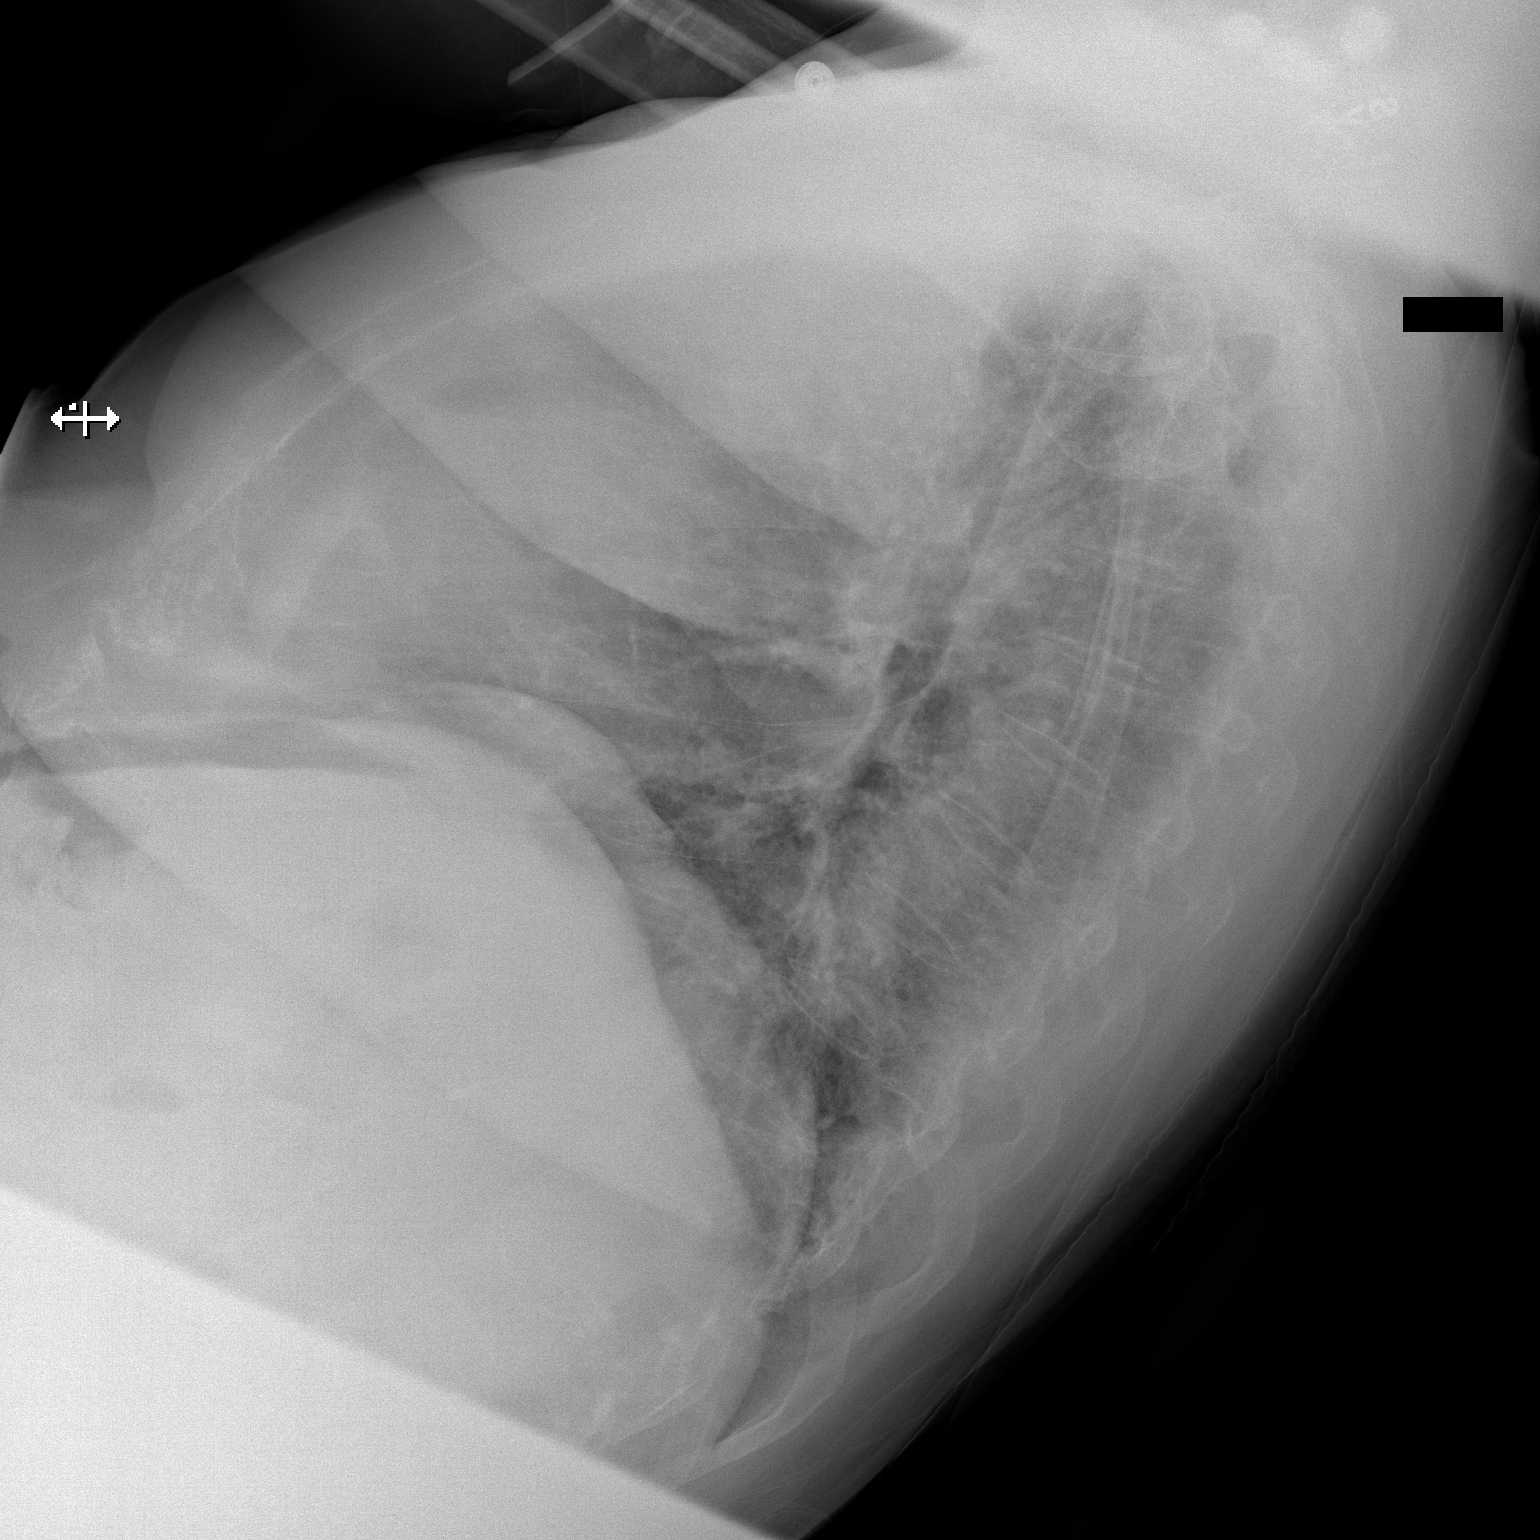

[x chest ap]
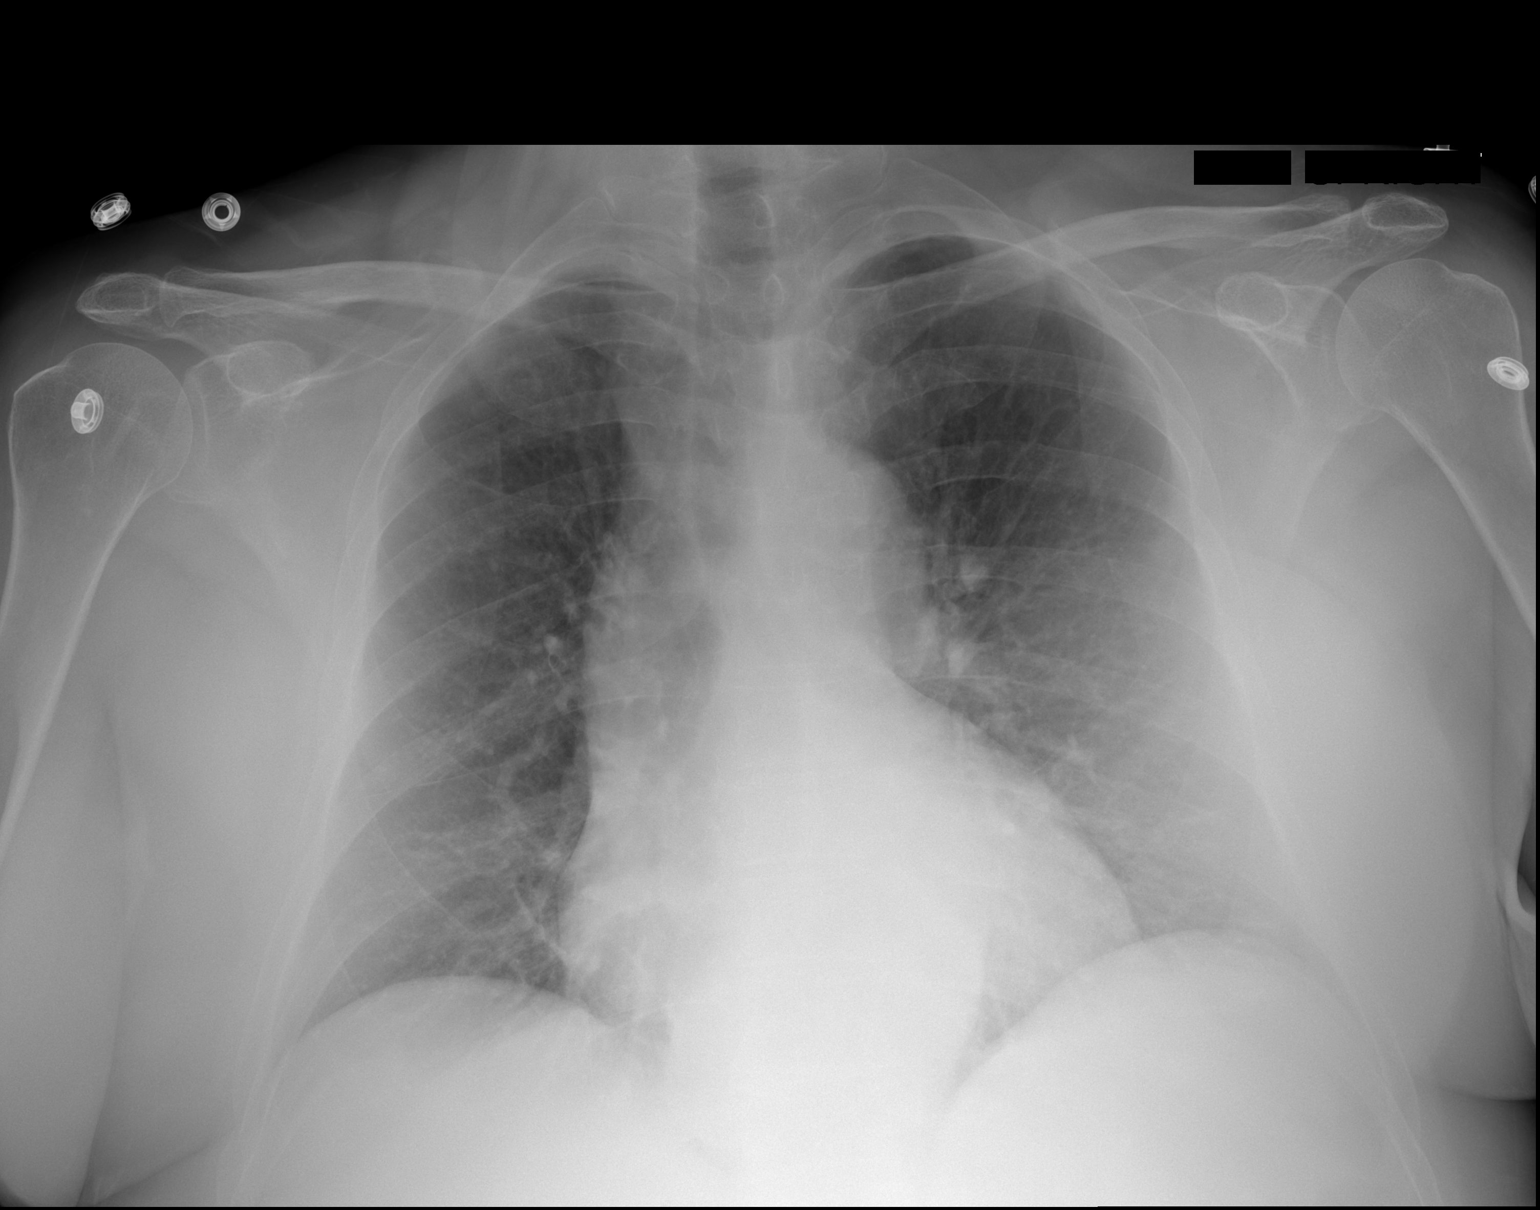

[2 of 2 positions shown; findings below may reference images not displayed]

FINDINGS: Prior left mastectomy.No pneumothorax. Lungs clear.
Heart size and pulmonary vascularity normal.  No effusion.
Visualized bones unremarkable.
IMPRESSION: No acute disease

## 2013-04-13 IMAGING — CR DG CERVICAL SPINE COMPLETE 4+V
7 series · 7 of 7 positions shown · non-contrast
Comparison: None.

CLINICAL DATA: Pain post fall

CERVICAL SPINE - COMPLETE 4+ VIEW

[w cervical spine lat]
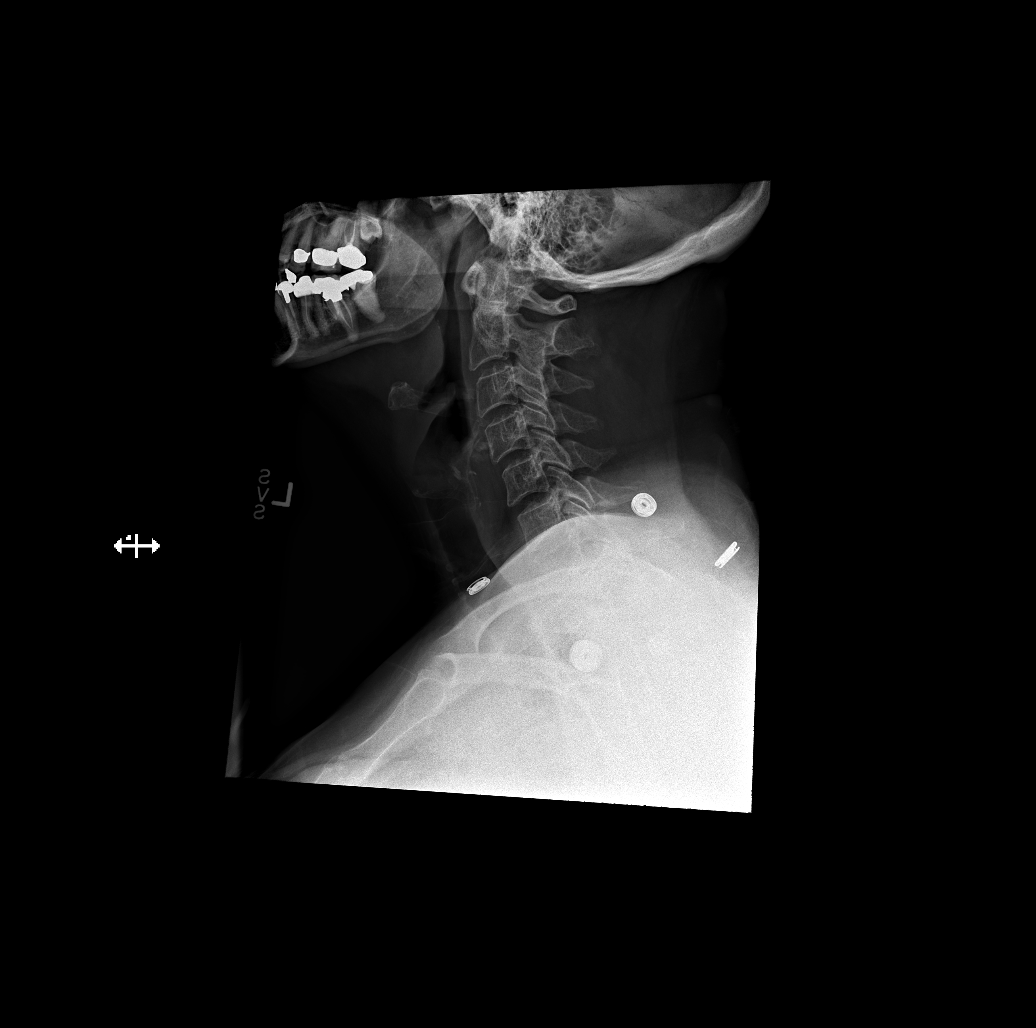

[w cervical spine ap_obl]
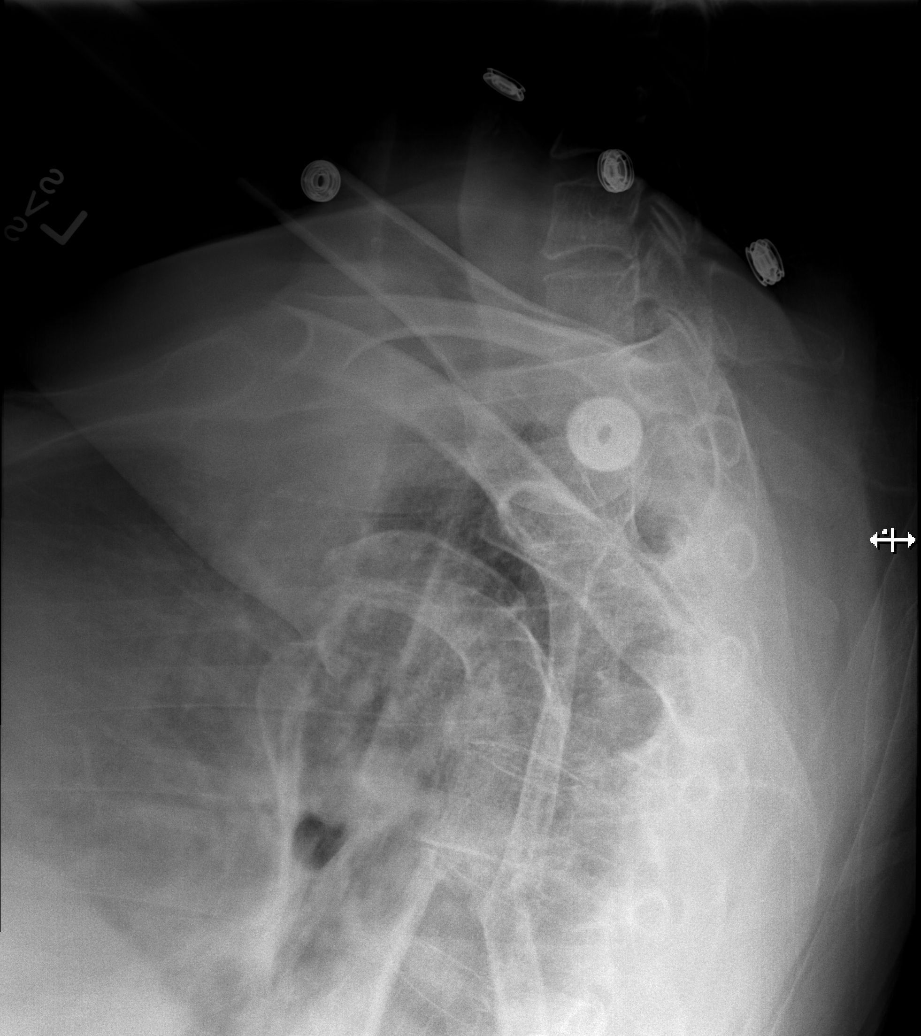

[t cervical spine ap]
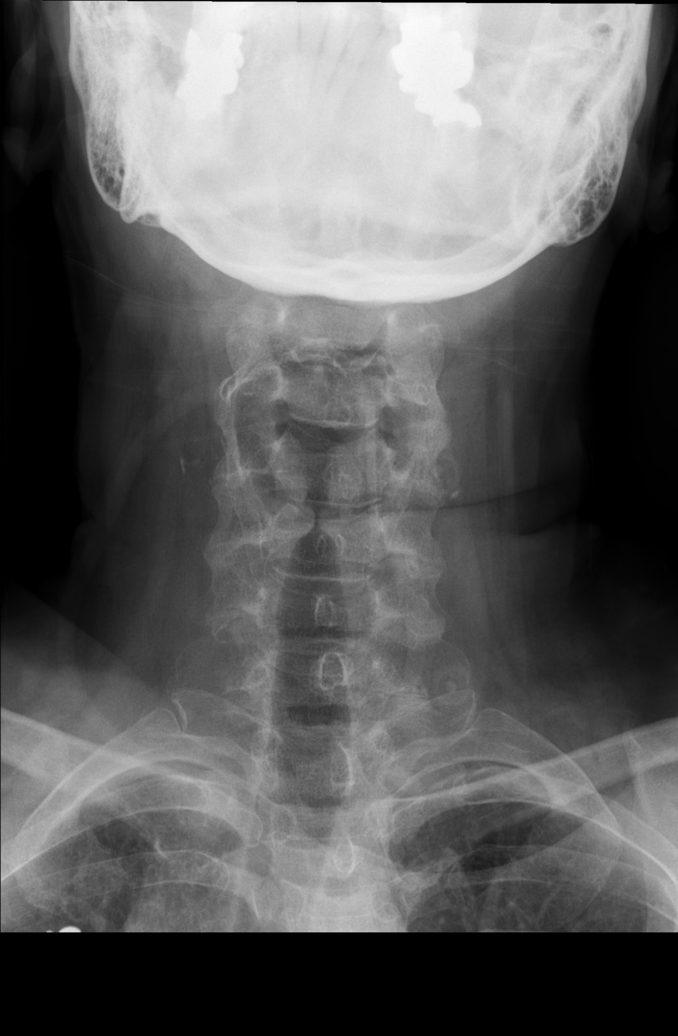

[t cervical spine odontoid]
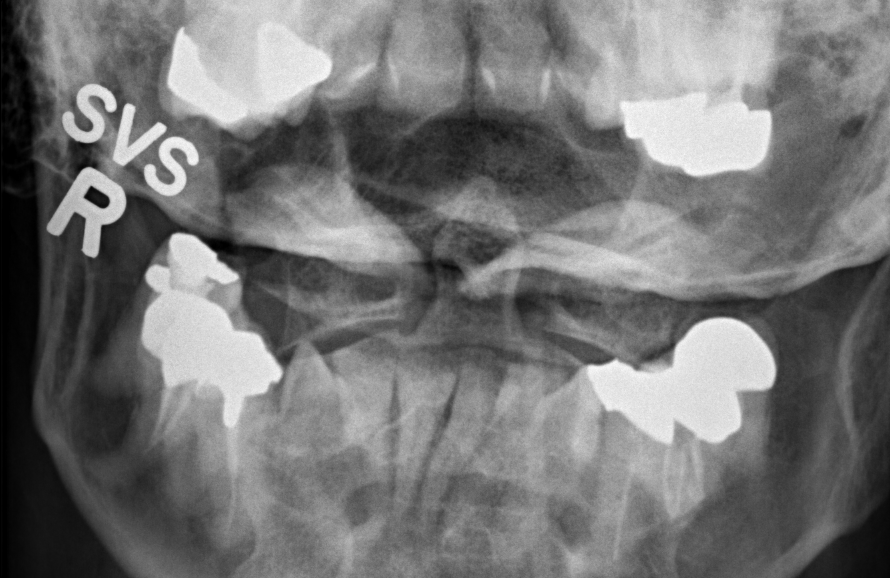

[t cervical spine obl (1 of 3)]
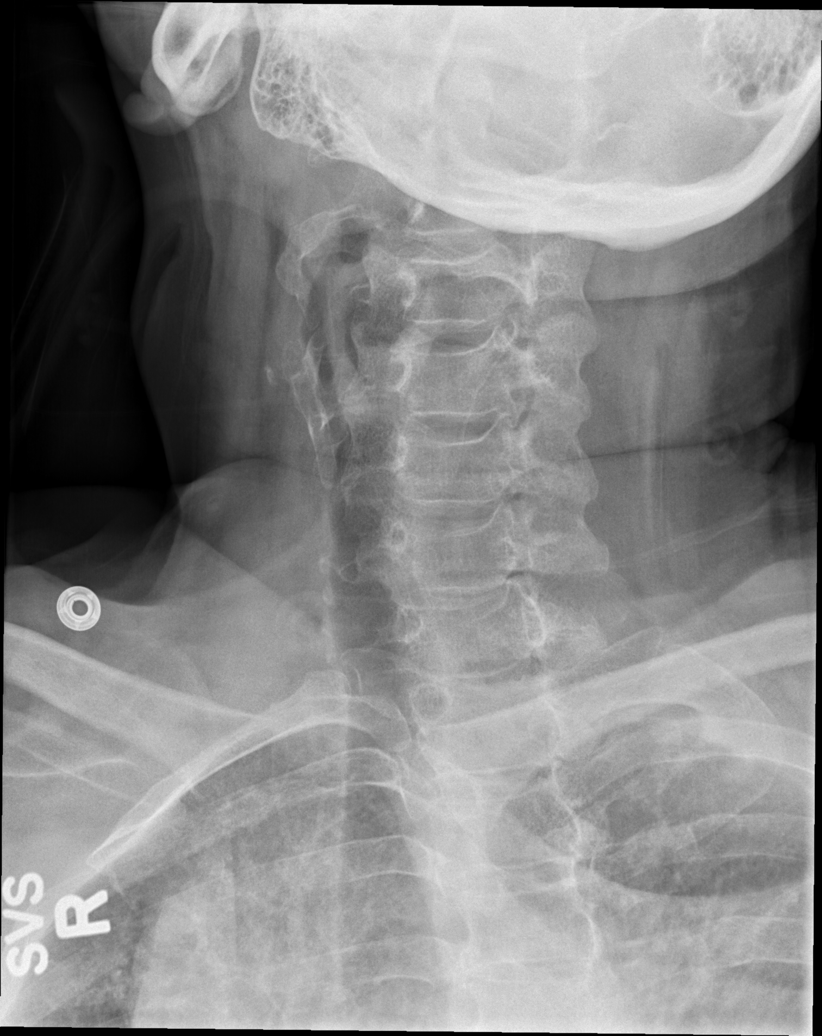

[t cervical spine obl (2 of 3)]
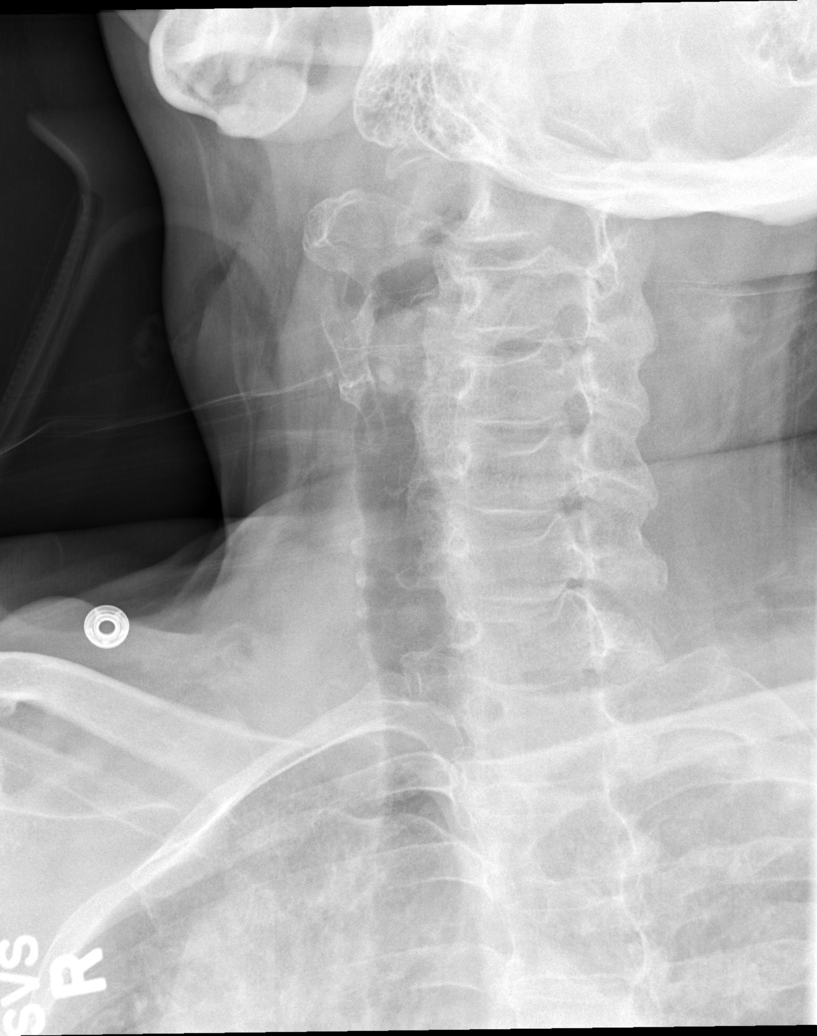

[t cervical spine obl (3 of 3)]
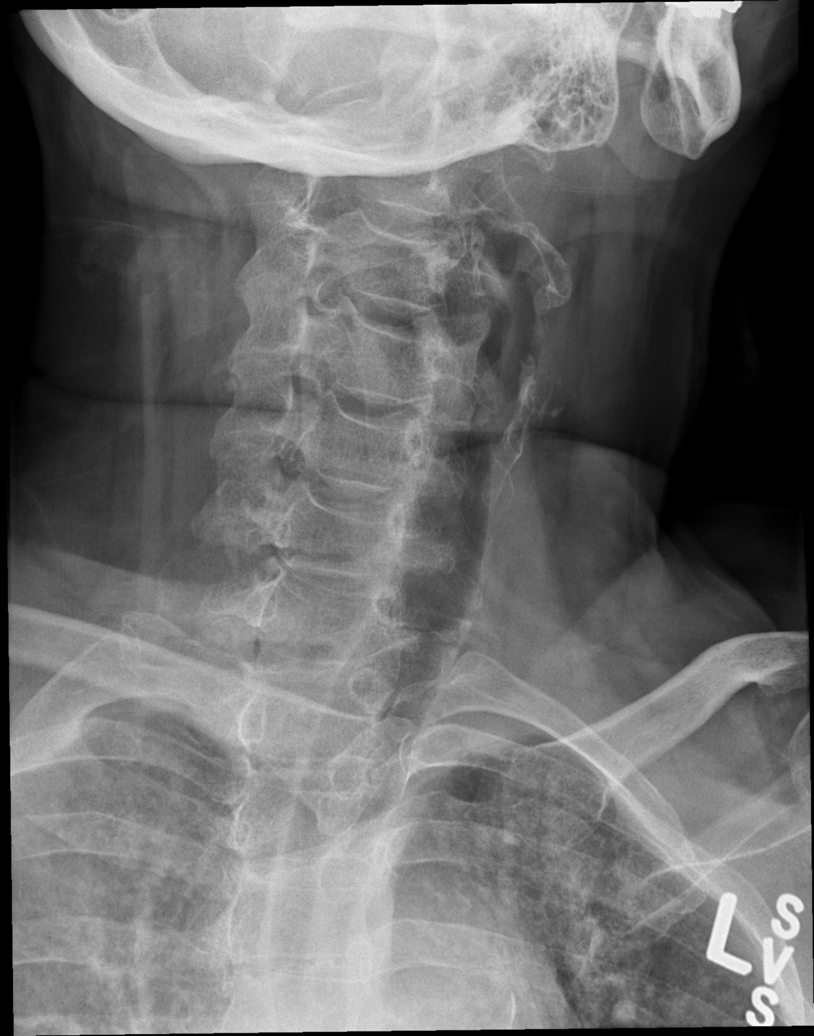

[7 of 7 positions shown; findings below may reference images not displayed]

FINDINGS: Negative for fracture, dislocation, or other acute bone
abnormality.  No prevertebral soft tissue swelling.  No significant
degenerative change. ]
IMPRESSION: [
Negative for fracture or other acute abnormality.

## 2013-04-14 ENCOUNTER — Ambulatory Visit (INDEPENDENT_AMBULATORY_CARE_PROVIDER_SITE_OTHER): Payer: Medicare HMO | Admitting: Family Medicine

## 2013-04-14 ENCOUNTER — Encounter: Payer: Self-pay | Admitting: Family Medicine

## 2013-04-14 VITALS — BP 150/90 | HR 80 | Temp 99.6°F | Wt 200.0 lb

## 2013-04-14 DIAGNOSIS — I10 Essential (primary) hypertension: Secondary | ICD-10-CM

## 2013-04-14 DIAGNOSIS — R000 Tachycardia, unspecified: Secondary | ICD-10-CM

## 2013-04-14 MED ORDER — METOPROLOL SUCCINATE ER 50 MG PO TB24: 50.0000 mg | ORAL_TABLET | Freq: Every day | ORAL | Status: DC

## 2013-04-14 MED ORDER — POTASSIUM CHLORIDE ER 10 MEQ PO TBCR: 10.0000 meq | EXTENDED_RELEASE_TABLET | Freq: Every day | ORAL | Status: DC

## 2013-04-14 NOTE — Progress Notes (Signed)
   Subjective:    Patient ID: Rebekah Moreno, female    DOB: [birth year], 67 y.o.   MRN: [id number]  HPI Here at the request of Dr. Welton Flakes for an irregular heartbeat. Rebekah Moreno has been under a lot of stress this year and she recently dealt with the death of her mother. She describes feeling tired and occasionally feels SOB when she is exerting herself. No chest pain or nausea or sweats. No palpitations or fluttering in the chest. When she was in Dr. Santo Held office Dr. Park Breed told her her heartbeats would alternate between fast and slow, and she told Rebekah Moreno to see [place]. Her BP has been running a little high. She recently had labs showing her potassium to be slightly low at 3.4.    Review of Systems  Constitutional: Negative.   Respiratory: Positive for shortness of breath. Negative for cough, chest tightness and wheezing.   Cardiovascular: Negative.   Neurological: Negative.        Objective:   Physical Exam  Constitutional: She appears well-developed and well-nourished. No distress.  Neck: No thyromegaly present.  Cardiovascular: Normal rate, regular rhythm, normal heart sounds and intact distal pulses.  Exam reveals no gallop and no friction rub.   No murmur heard. EKG shows sinus rhythm   Pulmonary/Chest: Effort normal and breath sounds normal. No respiratory distress. She has no wheezes. She has no rales.  Lymphadenopathy:    She has no cervical adenopathy.          Assessment & Plan:  I do not detect an arrhythmia today but her BP is up a bit. We will add Metoprolol succinate to her meds and start her on potassium. Recheck in 3 weeks

## 2013-05-05 ENCOUNTER — Ambulatory Visit: Payer: Medicare HMO | Admitting: Family Medicine

## 2013-05-06 ENCOUNTER — Encounter: Payer: Self-pay | Admitting: Family Medicine

## 2013-05-06 ENCOUNTER — Ambulatory Visit (INDEPENDENT_AMBULATORY_CARE_PROVIDER_SITE_OTHER): Payer: Medicare HMO | Admitting: Family Medicine

## 2013-05-06 VITALS — BP 150/86 | HR 76 | Temp 99.5°F | Wt 205.0 lb

## 2013-05-06 DIAGNOSIS — J019 Acute sinusitis, unspecified: Secondary | ICD-10-CM

## 2013-05-06 DIAGNOSIS — I10 Essential (primary) hypertension: Secondary | ICD-10-CM

## 2013-05-06 DIAGNOSIS — F411 Generalized anxiety disorder: Secondary | ICD-10-CM | POA: Insufficient documentation

## 2013-05-06 MED ORDER — AMOXICILLIN-POT CLAVULANATE 875-125 MG PO TABS: 1.0000 | ORAL_TABLET | Freq: Two times a day (BID) | ORAL | Status: DC

## 2013-05-06 MED ORDER — ALPRAZOLAM 0.5 MG PO TABS: 0.5000 mg | ORAL_TABLET | Freq: Three times a day (TID) | ORAL | Status: DC | PRN

## 2013-05-06 NOTE — Progress Notes (Signed)
   Subjective:    Patient ID: Rebekah Moreno, female    DOB: [birth year], 67 y.o.   MRN: [id number]  HPI Here to follow up on irregular pulse rates and HTN. She has been taking Metoprolol and has done well. Her BP is stable at home. No palpitations. She has had a lot of anxiety lately however with family issues. Her Xanax rx has run out. Also she has had sinus pressure for a week and is coughing up green sputum.    Review of Systems  Constitutional: Negative.   HENT: Positive for congestion, postnasal drip and sinus pressure.   Eyes: Negative.   Respiratory: Positive for cough.        Objective:   Physical Exam  Constitutional: She appears well-developed and well-nourished.  HENT:  Right Ear: External ear normal.  Left Ear: External ear normal.  Nose: Nose normal.  Mouth/Throat: Oropharynx is clear and moist.  Eyes: Conjunctivae are normal.  Pulmonary/Chest: Effort normal and breath sounds normal.  Lymphadenopathy:    She has no cervical adenopathy.          Assessment & Plan:  Start on Augmentin. The Metoprolol seems to be helping.

## 2013-05-06 NOTE — Progress Notes (Signed)
Pre visit review using our clinic review tool, if applicable. No additional management support is needed unless otherwise documented below in the visit note. 

## 2013-05-11 ENCOUNTER — Other Ambulatory Visit: Payer: Self-pay | Admitting: Family Medicine

## 2013-06-04 ENCOUNTER — Encounter: Payer: Self-pay | Admitting: *Deleted

## 2013-06-07 ENCOUNTER — Encounter: Payer: Self-pay | Admitting: Family Medicine

## 2013-06-07 ENCOUNTER — Ambulatory Visit (INDEPENDENT_AMBULATORY_CARE_PROVIDER_SITE_OTHER): Payer: Medicare HMO | Admitting: Family Medicine

## 2013-06-07 VITALS — BP 140/80 | HR 80 | Temp 98.0°F | Ht 67.0 in | Wt 206.0 lb

## 2013-06-07 DIAGNOSIS — I10 Essential (primary) hypertension: Secondary | ICD-10-CM

## 2013-06-07 DIAGNOSIS — K449 Diaphragmatic hernia without obstruction or gangrene: Secondary | ICD-10-CM

## 2013-06-07 DIAGNOSIS — F411 Generalized anxiety disorder: Secondary | ICD-10-CM

## 2013-06-07 DIAGNOSIS — K219 Gastro-esophageal reflux disease without esophagitis: Secondary | ICD-10-CM

## 2013-06-07 NOTE — Progress Notes (Signed)
Pre visit review using our clinic review tool, if applicable. No additional management support is needed unless otherwise documented below in the visit note. 

## 2013-06-07 NOTE — Progress Notes (Signed)
   Subjective:    Patient ID: ADALEY KIENE, female    DOB: 1946/01/23, 68 y.o.   MRN: [id number]  HPI Here for follow up. She feels fine, the BP is stable. She and some friends will go on a cruise to the [place] next week.    Review of Systems  Constitutional: Negative.   Respiratory: Negative.   Cardiovascular: Negative.        Objective:   Physical Exam  Constitutional: She appears well-developed and well-nourished.  Cardiovascular: Normal rate, regular rhythm, normal heart sounds and intact distal pulses.   Pulmonary/Chest: Effort normal and breath sounds normal.          Assessment & Plan:  Doing well. Recheck in 6 months

## 2013-06-08 ENCOUNTER — Telehealth: Payer: Self-pay | Admitting: Family Medicine

## 2013-06-08 NOTE — Telephone Encounter (Signed)
Relevant patient education assigned to patient using Emmi. ° °

## 2013-06-22 ENCOUNTER — Encounter: Payer: Self-pay | Admitting: Family Medicine

## 2013-06-22 ENCOUNTER — Ambulatory Visit (INDEPENDENT_AMBULATORY_CARE_PROVIDER_SITE_OTHER): Payer: Medicare HMO | Admitting: Family Medicine

## 2013-06-22 VITALS — BP 140/80 | HR 85 | Temp 98.3°F | Ht 67.0 in | Wt 208.0 lb

## 2013-06-22 DIAGNOSIS — J019 Acute sinusitis, unspecified: Secondary | ICD-10-CM

## 2013-06-22 MED ORDER — HYDROCODONE-HOMATROPINE 5-1.5 MG/5ML PO SYRP: 5.0000 mL | ORAL_SOLUTION | ORAL | Status: DC | PRN

## 2013-06-22 MED ORDER — AMOXICILLIN-POT CLAVULANATE 875-125 MG PO TABS: 1.0000 | ORAL_TABLET | Freq: Two times a day (BID) | ORAL | Status: DC

## 2013-06-22 NOTE — Progress Notes (Signed)
Pre visit review using our clinic review tool, if applicable. No additional management support is needed unless otherwise documented below in the visit note. 

## 2013-06-22 NOTE — Progress Notes (Signed)
   Subjective:    Patient ID: Rebekah Moreno, female    DOB: 1945-08-18, 68 y.o.   MRN: [id number]  HPI Here for 3 days of sinus pressure , PND, ST, hoarseness and a dry cough. She just got back from a trip to the [place] yesterday.    Review of Systems  Constitutional: Negative.   HENT: Positive for congestion, postnasal drip, sinus pressure, sore throat and voice change.   Eyes: Negative.   Respiratory: Positive for cough.        Objective:   Physical Exam  Constitutional: She appears well-developed and well-nourished.  HENT:  Right Ear: External ear normal.  Left Ear: External ear normal.  Nose: Nose normal.  Mouth/Throat: Oropharynx is clear and moist.  Her voice is hoarse   Eyes: Conjunctivae are normal.  Pulmonary/Chest: Effort normal and breath sounds normal.          Assessment & Plan:  Drink fluids

## 2013-06-28 ENCOUNTER — Other Ambulatory Visit: Payer: Self-pay | Admitting: Family Medicine

## 2013-06-28 ENCOUNTER — Telehealth: Payer: Self-pay | Admitting: Family Medicine

## 2013-06-28 ENCOUNTER — Other Ambulatory Visit: Payer: Self-pay | Admitting: Dermatology

## 2013-06-28 NOTE — Telephone Encounter (Signed)
I received a fax denying Hydrocodone/Homatropine syrup for pt.  I called customer service and was advised the medication isn't covered under pt's plan for cold and cough.

## 2013-06-28 NOTE — Telephone Encounter (Signed)
I left a voice message for pt with below information.  

## 2013-06-29 ENCOUNTER — Telehealth: Payer: Self-pay | Admitting: Family Medicine

## 2013-06-29 MED ORDER — ALBUTEROL SULFATE HFA 108 (90 BASE) MCG/ACT IN AERS: 2.0000 | INHALATION_SPRAY | RESPIRATORY_TRACT | Status: DC | PRN

## 2013-06-29 NOTE — Telephone Encounter (Signed)
Refill request for [facility]il HFA [facility] which [facility] did send [facility].

## 2013-07-14 ENCOUNTER — Ambulatory Visit (INDEPENDENT_AMBULATORY_CARE_PROVIDER_SITE_OTHER)
Admission: RE | Admit: 2013-07-14 | Discharge: 2013-07-14 | Disposition: A | Discharge status: Home / Self Care | Payer: Medicare HMO | Source: Ambulatory Visit | Attending: Family Medicine | Admitting: Family Medicine

## 2013-07-14 ENCOUNTER — Ambulatory Visit (INDEPENDENT_AMBULATORY_CARE_PROVIDER_SITE_OTHER): Payer: Medicare HMO | Admitting: Family Medicine

## 2013-07-14 ENCOUNTER — Encounter: Payer: Self-pay | Admitting: Family Medicine

## 2013-07-14 VITALS — BP 116/70 | HR 77 | Temp 99.3°F | Ht 67.0 in | Wt 210.0 lb

## 2013-07-14 DIAGNOSIS — J209 Acute bronchitis, unspecified: Secondary | ICD-10-CM

## 2013-07-14 MED ORDER — LEVOFLOXACIN 500 MG PO TABS: 500.0000 mg | ORAL_TABLET | Freq: Every day | ORAL | Status: AC

## 2013-07-14 NOTE — Progress Notes (Signed)
   Subjective:    Patient ID: Rebekah Moreno, female    DOB: 1945-08-05, 68 y.o.   MRN: [id number]  HPI Here for continued URI sx. She was seen here 3 weeks ago for sinusitis sx and was given Augmentin. This did not help very much, and now her sx have moved down to her chest. She has tightness and is coughing up green sputum. She has low grade fevers.    Review of Systems  Constitutional: Positive for fever.  HENT: Negative.   Eyes: Negative.   Respiratory: Positive for cough and chest tightness.   Cardiovascular: Negative.        Objective:   Physical Exam  Constitutional: She appears well-developed and well-nourished.  HENT:  Right Ear: External ear normal.  Left Ear: External ear normal.  Nose: Nose normal.  Mouth/Throat: Oropharynx is clear and moist.  Eyes: Conjunctivae are normal.  Pulmonary/Chest: Effort normal and breath sounds normal. No respiratory distress. She has no wheezes. She has no rales.  Lymphadenopathy:    She has no cervical adenopathy.          Assessment & Plan:  Get a CXR. Try Levaquin.

## 2013-07-14 NOTE — Progress Notes (Signed)
Pre visit review using our clinic review tool, if applicable. No additional management support is needed unless otherwise documented below in the visit note. 

## 2013-07-27 ENCOUNTER — Other Ambulatory Visit: Payer: Self-pay | Admitting: Family Medicine

## 2013-08-10 ENCOUNTER — Ambulatory Visit: Payer: Medicare HMO | Admitting: Adult Health

## 2013-08-10 ENCOUNTER — Other Ambulatory Visit: Payer: Self-pay | Admitting: *Deleted

## 2013-08-10 ENCOUNTER — Other Ambulatory Visit: Payer: Medicare HMO | Admitting: Lab

## 2013-08-10 DIAGNOSIS — Z853 Personal history of malignant neoplasm of breast: Secondary | ICD-10-CM

## 2013-08-11 ENCOUNTER — Ambulatory Visit (HOSPITAL_BASED_OUTPATIENT_CLINIC_OR_DEPARTMENT_OTHER): Payer: Medicare HMO | Admitting: Oncology

## 2013-08-11 ENCOUNTER — Other Ambulatory Visit (HOSPITAL_BASED_OUTPATIENT_CLINIC_OR_DEPARTMENT_OTHER): Payer: Medicare HMO

## 2013-08-11 ENCOUNTER — Encounter: Payer: Self-pay | Admitting: Oncology

## 2013-08-11 VITALS — BP 130/75 | HR 60 | Temp 98.3°F | Resp 18 | Ht 67.0 in | Wt 210.1 lb

## 2013-08-11 DIAGNOSIS — F341 Dysthymic disorder: Secondary | ICD-10-CM

## 2013-08-11 DIAGNOSIS — Z171 Estrogen receptor negative status [ER-]: Secondary | ICD-10-CM

## 2013-08-11 DIAGNOSIS — Z853 Personal history of malignant neoplasm of breast: Secondary | ICD-10-CM

## 2013-08-11 DIAGNOSIS — C50219 Malignant neoplasm of upper-inner quadrant of unspecified female breast: Secondary | ICD-10-CM

## 2013-08-11 DIAGNOSIS — G8929 Other chronic pain: Secondary | ICD-10-CM

## 2013-08-11 DIAGNOSIS — M549 Dorsalgia, unspecified: Secondary | ICD-10-CM

## 2013-08-11 LAB — CBC WITH DIFFERENTIAL/PLATELET
BASO%: 0.8 % (ref 0.0–2.0)
Basophils Absolute: 0 10*3/uL (ref 0.0–0.1)
EOS%: 5 % (ref 0.0–7.0)
Eosinophils Absolute: 0.2 10*3/uL (ref 0.0–0.5)
HCT: 36.5 % (ref 34.8–46.6)
HEMOGLOBIN: 11.8 g/dL (ref 11.6–15.9)
LYMPH#: 0.8 10*3/uL — AB (ref 0.9–3.3)
LYMPH%: 19.1 % (ref 14.0–49.7)
MCH: 27.2 pg (ref 25.1–34.0)
MCHC: 32.4 g/dL (ref 31.5–36.0)
MCV: 84 fL (ref 79.5–101.0)
MONO#: 0.4 10*3/uL (ref 0.1–0.9)
MONO%: 9.5 % (ref 0.0–14.0)
NEUT#: 2.8 10*3/uL (ref 1.5–6.5)
NEUT%: 65.6 % (ref 38.4–76.8)
Platelets: 176 10*3/uL (ref 145–400)
RBC: 4.34 10*6/uL (ref 3.70–5.45)
RDW: 14.2 % (ref 11.2–14.5)
WBC: 4.3 10*3/uL (ref 3.9–10.3)

## 2013-08-11 LAB — COMPREHENSIVE METABOLIC PANEL (CC13)
ALBUMIN: 3.8 g/dL (ref 3.5–5.0)
ALT: 20 U/L (ref 0–55)
ANION GAP: 12 meq/L — AB (ref 3–11)
AST: 22 U/L (ref 5–34)
Alkaline Phosphatase: 97 U/L (ref 40–150)
BUN: 18.3 mg/dL (ref 7.0–26.0)
CALCIUM: 9.8 mg/dL (ref 8.4–10.4)
CHLORIDE: 103 meq/L (ref 98–109)
CO2: 27 mEq/L (ref 22–29)
CREATININE: 0.8 mg/dL (ref 0.6–1.1)
Glucose: 94 mg/dl (ref 70–140)
POTASSIUM: 3.9 meq/L (ref 3.5–5.1)
Sodium: 142 mEq/L (ref 136–145)
Total Bilirubin: 0.48 mg/dL (ref 0.20–1.20)
Total Protein: 6.7 g/dL (ref 6.4–8.3)

## 2013-08-11 NOTE — Patient Instructions (Signed)
Doing well, no cancer recurrence  We will continue to see you every 4 months

## 2013-08-11 NOTE — Progress Notes (Signed)
[facility] OFFICE PROGRESS NOTE  Patient Care Team: Laurey Morale, MD as PCP - General  DIAGNOSIS: 68 year old female with multifocal invasive ductal carcinoma of the left breast status post left modified radical mastectomy with axillary lymph node dissection performed in November 2011. The tumor was ER negative PR negative HER-2/neu negative with a proliferation marker 18%   SUMMARY OF ONCOLOGIC HISTORY: #1 patient underwent a mastectomy of the left breast with axillary lymph node dissection November 2011. She was found to have multifocal disease. The node dissection was negative for metastatic disease. Tumor was ER negative PR negative HER-2/neu negative proliferation marker 18%. She had immediate reconstruction.  #2 patient then received 4 cycles of adjuvant chemotherapy initially consisting of dose dense Adriamycin and Cytoxan from 04/25/2010 to 06/06/2010.  #3 she then received Taxol and carboplatinum from 06/21/2010 to 08/06/2010. She received a total of 7 cycles of combination. Patient developed peripheral paresthesias and severe myelo suppression. Thus carboplatin was discontinued and she went on to complete single agent Taxol from 08/20/2010 2 09/20/2010. Patient completed a total of 12 weeks of taxane containing regimen  #3 S/P left breast reconstruction by Dr. Crissie Reese.   CURRENT THERAPY:Observation   INTERVAL HISTORY: Rebekah Moreno 68 y.o. female returns for followup visit today. She is being seen on every 4 month basis. She is very concerned about her risk of relapse and would prefer to be seen every 4 months rather than every 6 months. Which I do think seems reasonable. Clinically she has really no complaints. She continues to build houses. She has not noticed any lumps or bumps in her right breast. She has noticed that the implant on the left becomes a little bit bulging when she sits upright or bends down. I have recommended that she call Dr. Harlow Mares office  and speak to him regarding this were possible visit. To my clinical eyes I do not think she has had any leakage..  I have reviewed the past medical history, past surgical history, social history and family history with the patient and they are unchanged from previous note.  ALLERGIES:  is allergic to dust mite extract; mold extract; and other.  MEDICATIONS:  Current Outpatient Prescriptions  Medication Sig Dispense Refill  . acyclovir (ZOVIRAX) 400 MG tablet TAKE 1 TABLET BY MOUTH EVERY 8 HOURS AS NEEDED FOR FEVER BLISTERS  60 tablet  3  . albuterol (PROVENTIL HFA;VENTOLIN HFA) 108 (90 BASE) MCG/ACT inhaler Inhale 2 puffs into the lungs every 4 (four) hours as needed for wheezing or shortness of breath.  1 Inhaler  3  . ALPRAZolam (XANAX) 0.5 MG tablet Take 1 tablet (0.5 mg total) by mouth 3 (three) times daily as needed for sleep or anxiety.  60 tablet  5  . celecoxib (CELEBREX) 200 MG capsule Take 200 mg by mouth 2 (two) times daily as needed.      . cyclobenzaprine (FLEXERIL) 10 MG tablet TAKE 1 TABLET BY MOUTH 3 TIMES A DAY AS NEEDED FOR MUSCLE SPASMS  60 tablet  3  . enalapril-hydrochlorothiazide (VASERETIC) 10-25 MG per tablet TAKE 1 TABLET BY MOUTH DAILY.  30 tablet  11  . metoprolol succinate (TOPROL XL) 50 MG 24 hr tablet Take 1 tablet (50 mg total) by mouth daily. Take with or immediately following a meal.  30 tablet  5  . Multiple Vitamins-Minerals (CENTRUM ULTRA WOMENS) TABS Take 1 tablet by mouth daily.        Marland Kitchen NEXIUM 40 MG capsule TAKE  1 CAPSULE EVERY DAY  30 capsule  6  . ondansetron (ZOFRAN) 8 MG tablet Take 1 tablet (8 mg total) by mouth every 8 (eight) hours as needed for nausea or vomiting.  30 tablet  1  . potassium chloride (KLOR-CON 10) 10 MEQ tablet Take 1 tablet (10 mEq total) by mouth daily.  30 tablet  11  . sertraline (ZOLOFT) 100 MG tablet TAKE 1 TABLET BY MOUTH DAILY.  30 tablet  9  . simvastatin (ZOCOR) 40 MG tablet TAKE 1 TABLET BY MOUTH EVERY EVENING  30 tablet   1  . HYDROcodone-acetaminophen (NORCO) 10-325 MG per tablet Take 1 tablet by mouth every 6 (six) hours as needed for pain.  60 tablet  0  . HYDROcodone-homatropine (HYDROMET) 5-1.5 MG/5ML syrup Take 5 mLs by mouth every 4 (four) hours as needed for cough.  240 mL  0   No current facility-administered medications for this visit.    REVIEW OF SYSTEMS:   Constitutional: Denies fevers, chills or abnormal weight loss Eyes: Denies blurriness of vision Ears, nose, mouth, throat, and face: Denies mucositis or sore throat Respiratory: Denies cough, dyspnea or wheezes Cardiovascular: Denies palpitation, chest discomfort or lower extremity swelling Gastrointestinal:  Denies nausea, heartburn or change in bowel habits Skin: Denies abnormal skin rashes Lymphatics: Denies new lymphadenopathy or easy bruising Neurological:Denies numbness, tingling or new weaknesses Behavioral/Psych: Mood is stable, no new changes  All other systems were reviewed with the patient and are negative.  PHYSICAL EXAMINATION: ECOG PERFORMANCE STATUS: 0 - Asymptomatic  Filed Vitals:   08/11/13 1321  BP: 130/75  Pulse: 60  Temp: 98.3 F (36.8 C)  Resp: 18   Filed Weights   08/11/13 1321  Weight: 210 lb 1.6 oz (95.301 kg)    GENERAL:alert, no distress and comfortable SKIN: skin color, texture, turgor are normal, no rashes or significant lesions EYES: normal, Conjunctiva are pink and non-injected, sclera clear OROPHARYNX:no exudate, no erythema and lips, buccal mucosa, and tongue normal  NECK: supple, thyroid normal size, non-tender, without nodularity LYMPH:  no palpable lymphadenopathy in the cervical, axillary or inguinal LUNGS: clear to auscultation and percussion with normal breathing effort HEART: regular rate & rhythm and no murmurs and no lower extremity edema ABDOMEN:abdomen soft, non-tender and normal bowel sounds Musculoskeletal:no cyanosis of digits and no clubbing  NEURO: alert & oriented x 3 with  fluent speech, no focal motor/sensory deficits Breasts: right breast normal without mass, skin or nipple changes or axillary nodes, post-mastectomy site well healed and free of suspicious changes, reconstructed breast no suspicious findings  LABORATORY DATA:  I have reviewed the data as listed    Component Value Date/Time   NA 142 04/12/2013 1041   NA 136 07/05/2012 0445   K 3.4* 04/12/2013 1041   K 4.1 07/05/2012 0445   CL 104 09/09/2012 0935   CL 100 07/05/2012 0445   CO2 26 04/12/2013 1041   CO2 25 07/05/2012 0445   GLUCOSE 115 04/12/2013 1041   GLUCOSE 107* 09/09/2012 0935   GLUCOSE 195* 07/05/2012 0445   BUN 17.4 04/12/2013 1041   BUN 20 07/05/2012 0445   CREATININE 0.8 04/12/2013 1041   CREATININE 0.62 07/05/2012 0445   CALCIUM 9.8 04/12/2013 1041   CALCIUM 8.6 07/05/2012 0445   PROT 7.4 04/12/2013 1041   PROT 6.4 07/03/2012 2328   ALBUMIN 3.9 04/12/2013 1041   ALBUMIN 3.4* 07/03/2012 2328   AST 24 04/12/2013 1041   AST 19 07/03/2012 2328   ALT 20 04/12/2013  1041   ALT 15 07/03/2012 2328   ALKPHOS 125 04/12/2013 1041   ALKPHOS 116 07/03/2012 2328   BILITOT 0.71 04/12/2013 1041   BILITOT 0.2* 07/03/2012 2328   GFRNONAA >90 07/05/2012 0445   GFRAA >90 07/05/2012 0445    No results found for this basename: SPEP, UPEP,  kappa and lambda light chains    Lab Results  Component Value Date   WBC 4.3 08/11/2013   NEUTROABS 2.8 08/11/2013   HGB 11.8 08/11/2013   HCT 36.5 08/11/2013   MCV 84.0 08/11/2013   PLT 176 08/11/2013      Chemistry      Component Value Date/Time   NA 142 04/12/2013 1041   NA 136 07/05/2012 0445   K 3.4* 04/12/2013 1041   K 4.1 07/05/2012 0445   CL 104 09/09/2012 0935   CL 100 07/05/2012 0445   CO2 26 04/12/2013 1041   CO2 25 07/05/2012 0445   BUN 17.4 04/12/2013 1041   BUN 20 07/05/2012 0445   CREATININE 0.8 04/12/2013 1041   CREATININE 0.62 07/05/2012 0445      Component Value Date/Time   CALCIUM 9.8 04/12/2013 1041   CALCIUM 8.6 07/05/2012 0445   ALKPHOS  125 04/12/2013 1041   ALKPHOS 116 07/03/2012 2328   AST 24 04/12/2013 1041   AST 19 07/03/2012 2328   ALT 20 04/12/2013 1041   ALT 15 07/03/2012 2328   BILITOT 0.71 04/12/2013 1041   BILITOT 0.2* 07/03/2012 2328       RADIOGRAPHIC STUDIES: I have personally reviewed the radiological images as listed and agreed with the findings in the report. No results found.    ASSESSMENT & PLAN:  68 year old female with  #1 history of multifocal invasive ductal carcinoma of the left breast. Patient initially underwent left modified radical mastectomy with axillary lymph node dissection performed in November 2011. The tumor was ER negative PR negative HER-2/neu negative with a proliferation marker Ki-67 13%. Patient did have immediate reconstruction. She then went on to receive adjuvant chemotherapy consisting of 4 cycles of adjuvant Adriamycin Cytoxan given dose dense from 04/25/2010 to 06/06/2010. She then received Taxol and carboplatinum weekly for a total of 7 weeks from 06/21/2010 through 08/06/2010. She did develop peripheral neuropathy and severe myelosuppression. The carboplatin was discontinued and then she went on to complete single agent Taxol from 08/20/2010 2 09/20/2010 for a total of 5 cycles. She has no evidence of recurrent disease.  #2 survivorship: We discussed exercise eating healthy maintaining a good body weight. She has joined she is planning on going to work out.  #3 history of chronic back pain: This is well-controlled.  #4 anxiety/depression: Patient is on Zoloft and is doing well.  #4 followup: Patient will be seen back in 4 months with blood work.  No orders of the defined types were placed in this encounter.   All questions were answered. The patient knows to call the clinic with any problems, questions or concerns. No barriers to learning was detected. I spent 20 minutes counseling the patient face to face. The total time spent in the appointment was 30 minutes and more  than 50% was on counseling and review of test results     Marcy Panning, MD 08/11/2013 1:50 PM

## 2013-08-12 ENCOUNTER — Telehealth: Payer: Self-pay | Admitting: Oncology

## 2013-08-12 NOTE — Telephone Encounter (Signed)
, °

## 2013-08-13 ENCOUNTER — Other Ambulatory Visit: Payer: Self-pay | Admitting: Family Medicine

## 2013-08-23 ENCOUNTER — Encounter (INDEPENDENT_AMBULATORY_CARE_PROVIDER_SITE_OTHER): Payer: Self-pay | Admitting: General Surgery

## 2013-08-23 ENCOUNTER — Ambulatory Visit (INDEPENDENT_AMBULATORY_CARE_PROVIDER_SITE_OTHER): Payer: Medicare HMO | Admitting: General Surgery

## 2013-08-23 VITALS — BP 128/72 | HR 75 | Temp 97.4°F | Resp 16 | Ht 66.0 in | Wt 211.2 lb

## 2013-08-23 DIAGNOSIS — C50919 Malignant neoplasm of unspecified site of unspecified female breast: Secondary | ICD-10-CM

## 2013-08-23 NOTE — Patient Instructions (Signed)
Call if you find any new lumps in your breasts or chest wall. 

## 2013-08-23 NOTE — Progress Notes (Signed)
Operation:  Left mastectomy and reconstruction  Date:  November 2011  Final pathology: T1N0   Hormone receptor status: Negative  HPI: She is here for followup of her left breast cancer.  She denies left chest wall or right breast masses.  No adenopathy.  She feels that the inferior aspect of the reconstructed right breast is a little softer.  PE: Rebekah Moreno looks well and is in no acute distress  Right breast-no visible or palpable abnormalities.  Left breast-she has a reconstructed nipple and her breast reconstruction looks good. No nodules around the incision or native skin.  The implant feels soft and appears uniform.  Lymph nodes-no palpable cervical, supraclavicular, or axillary adenopathy.  Assessment:  Left breast cancer-no clinical evidence of recurrence.    Plan: Return visit 9 months. Dr. Humphrey Rolls is due to see her in July. I told her she is concerned with the implant that she go back and see Dr. Harlow Mares.

## 2013-09-01 ENCOUNTER — Other Ambulatory Visit: Payer: Self-pay | Admitting: Family Medicine

## 2013-09-23 ENCOUNTER — Other Ambulatory Visit: Payer: Self-pay | Admitting: Family Medicine

## 2013-09-24 NOTE — Telephone Encounter (Signed)
Call in #[phone] with 5 rf 

## 2013-10-01 ENCOUNTER — Other Ambulatory Visit: Payer: Self-pay | Admitting: Family Medicine

## 2013-10-10 ENCOUNTER — Other Ambulatory Visit: Payer: Self-pay | Admitting: Family Medicine

## 2013-11-01 ENCOUNTER — Other Ambulatory Visit: Payer: Self-pay | Admitting: Family Medicine

## 2013-11-09 ENCOUNTER — Telehealth: Payer: Self-pay | Admitting: Hematology and Oncology

## 2013-11-09 ENCOUNTER — Other Ambulatory Visit: Payer: Self-pay | Admitting: Family Medicine

## 2013-11-09 NOTE — Telephone Encounter (Signed)
, °

## 2013-11-22 ENCOUNTER — Ambulatory Visit: Payer: Medicare HMO | Admitting: Oncology

## 2013-11-22 ENCOUNTER — Other Ambulatory Visit: Payer: Medicare HMO

## 2013-11-30 ENCOUNTER — Other Ambulatory Visit (INDEPENDENT_AMBULATORY_CARE_PROVIDER_SITE_OTHER): Payer: Medicare HMO

## 2013-11-30 DIAGNOSIS — Z0000 Encounter for general adult medical examination without abnormal findings: Secondary | ICD-10-CM

## 2013-11-30 DIAGNOSIS — E785 Hyperlipidemia, unspecified: Secondary | ICD-10-CM

## 2013-11-30 LAB — LIPID PANEL
CHOL/HDL RATIO: 4
Cholesterol: 183 mg/dL (ref 0–200)
HDL: 52.2 mg/dL (ref >39.00)
LDL Cholesterol: 100 mg/dL — ABNORMAL HIGH (ref 0–99)
NONHDL: 130.8
TRIGLYCERIDES: 153 mg/dL — AB (ref 0.0–149.0)
VLDL: 30.6 mg/dL (ref 0.0–40.0)

## 2013-11-30 LAB — CBC WITH DIFFERENTIAL/PLATELET
BASOS ABS: 0 10*3/uL (ref 0.0–0.1)
Basophils Relative: 0.4 % (ref 0.0–3.0)
EOS ABS: 0.2 10*3/uL (ref 0.0–0.7)
Eosinophils Relative: 5 % (ref 0.0–5.0)
HEMATOCRIT: 35.7 % — AB (ref 36.0–46.0)
Hemoglobin: 11.8 g/dL — ABNORMAL LOW (ref 12.0–15.0)
LYMPHS ABS: 1 10*3/uL (ref 0.7–4.0)
Lymphocytes Relative: 20.5 % (ref 12.0–46.0)
MCHC: 33 g/dL (ref 30.0–36.0)
MCV: 83.8 fl (ref 78.0–100.0)
Monocytes Absolute: 0.3 10*3/uL (ref 0.1–1.0)
Monocytes Relative: 6.7 % (ref 3.0–12.0)
Neutro Abs: 3.3 10*3/uL (ref 1.4–7.7)
Neutrophils Relative %: 67.4 % (ref 43.0–77.0)
PLATELETS: 174 10*3/uL (ref 150.0–400.0)
RBC: 4.25 Mil/uL (ref 3.87–5.11)
RDW: 15.3 % (ref 11.5–15.5)
WBC: 4.8 10*3/uL (ref 4.0–10.5)

## 2013-11-30 LAB — POCT URINALYSIS DIPSTICK
Bilirubin, UA: NEGATIVE
GLUCOSE UA: NEGATIVE
Ketones, UA: NEGATIVE
NITRITE UA: NEGATIVE
PH UA: 5.5
RBC UA: NEGATIVE
Spec Grav, UA: >=1.03
UROBILINOGEN UA: 0.2

## 2013-11-30 LAB — HEPATIC FUNCTION PANEL
ALBUMIN: 4 g/dL (ref 3.5–5.2)
ALT: 20 U/L (ref 0–35)
AST: 21 U/L (ref 0–37)
Alkaline Phosphatase: 77 U/L (ref 39–117)
BILIRUBIN TOTAL: 0.7 mg/dL (ref 0.2–1.2)
Bilirubin, Direct: 0.1 mg/dL (ref 0.0–0.3)
Total Protein: 6.7 g/dL (ref 6.0–8.3)

## 2013-11-30 LAB — BASIC METABOLIC PANEL
BUN: 20 mg/dL (ref 6–23)
CO2: 29 meq/L (ref 19–32)
Calcium: 9.3 mg/dL (ref 8.4–10.5)
Chloride: 103 mEq/L (ref 96–112)
Creatinine, Ser: 0.8 mg/dL (ref 0.4–1.2)
GFR: 71.62 mL/min (ref >60.00)
GLUCOSE: 122 mg/dL — AB (ref 70–99)
POTASSIUM: 3.8 meq/L (ref 3.5–5.1)
Sodium: 141 mEq/L (ref 135–145)

## 2013-11-30 LAB — TSH: TSH: 3.12 u[IU]/mL (ref 0.35–4.50)

## 2013-12-07 ENCOUNTER — Ambulatory Visit (INDEPENDENT_AMBULATORY_CARE_PROVIDER_SITE_OTHER): Payer: Medicare HMO | Admitting: Family Medicine

## 2013-12-07 ENCOUNTER — Encounter: Payer: Self-pay | Admitting: Family Medicine

## 2013-12-07 VITALS — BP 129/72 | HR 70 | Temp 99.0°F | Ht 66.0 in | Wt 214.0 lb

## 2013-12-07 DIAGNOSIS — Z0000 Encounter for general adult medical examination without abnormal findings: Secondary | ICD-10-CM

## 2013-12-07 MED ORDER — CELECOXIB 200 MG PO CAPS: 200.0000 mg | ORAL_CAPSULE | Freq: Two times a day (BID) | ORAL | Status: DC

## 2013-12-07 MED ORDER — ENALAPRIL-HYDROCHLOROTHIAZIDE 10-25 MG PO TABS: ORAL_TABLET | ORAL | Status: DC

## 2013-12-07 MED ORDER — METOPROLOL SUCCINATE ER 50 MG PO TB24: ORAL_TABLET | ORAL | Status: DC

## 2013-12-07 MED ORDER — SIMVASTATIN 40 MG PO TABS: ORAL_TABLET | ORAL | Status: DC

## 2013-12-07 MED ORDER — DOXYCYCLINE HYCLATE 100 MG PO CAPS
100.0000 mg | ORAL_CAPSULE | Freq: Two times a day (BID) | ORAL | Status: AC
Start: 2013-12-07 — End: 2013-12-17

## 2013-12-07 MED ORDER — POTASSIUM CHLORIDE ER 10 MEQ PO TBCR: 10.0000 meq | EXTENDED_RELEASE_TABLET | Freq: Every day | ORAL | Status: DC

## 2013-12-07 MED ORDER — SERTRALINE HCL 100 MG PO TABS: ORAL_TABLET | ORAL | Status: DC

## 2013-12-07 MED ORDER — ESOMEPRAZOLE MAGNESIUM 40 MG PO CPDR: DELAYED_RELEASE_CAPSULE | ORAL | Status: DC

## 2013-12-07 NOTE — Progress Notes (Signed)
Pre visit review using our clinic review tool, if applicable. No additional management support is needed unless otherwise documented below in the visit note. 

## 2013-12-07 NOTE — Progress Notes (Signed)
   Subjective:    Patient ID: Rebekah Moreno, female    DOB: [birth year], 68 y.o.   MRN: [id number]  HPI 68 yr old female for a cpx. She feels well except for some joint pains. She takes Celebrex once a day and supplements it with Aleve at times. She works out at [employer] several days a week.    Review of Systems  Constitutional: Negative.   HENT: Negative.   Eyes: Negative.   Respiratory: Negative.   Cardiovascular: Negative.   Gastrointestinal: Negative.   Genitourinary: Negative for dysuria, urgency, frequency, hematuria, flank pain, decreased urine volume, enuresis, difficulty urinating, pelvic pain and dyspareunia.  Musculoskeletal: Negative.   Skin: Negative.   Neurological: Negative.   Psychiatric/Behavioral: Negative.        Objective:   Physical Exam  Constitutional: She is oriented to person, place, and time. She appears well-developed and well-nourished. No distress.  HENT:  Head: Normocephalic and atraumatic.  Right Ear: External ear normal.  Left Ear: External ear normal.  Nose: Nose normal.  Mouth/Throat: Oropharynx is clear and moist. No oropharyngeal exudate.  Eyes: Conjunctivae and EOM are normal. Pupils are equal, round, and reactive to light. No scleral icterus.  Neck: Normal range of motion. Neck supple. No JVD present. No thyromegaly present.  Cardiovascular: Normal rate, regular rhythm, normal heart sounds and intact distal pulses.  Exam reveals no gallop and no friction rub.   No murmur heard. Pulmonary/Chest: Effort normal and breath sounds normal. No respiratory distress. She has no wheezes. She has no rales. She exhibits no tenderness.  Abdominal: Soft. Bowel sounds are normal. She exhibits no distension and no mass. There is no tenderness. There is no rebound and no guarding.  Musculoskeletal: Normal range of motion. She exhibits no edema and no tenderness.  Lymphadenopathy:    She has no cervical adenopathy.  Neurological: She is alert and oriented  to person, place, and time. She has normal reflexes. No cranial nerve deficit. She exhibits normal muscle tone. Coordination normal.  Skin: Skin is warm and dry. No rash noted. No erythema.  Psychiatric: She has a normal mood and affect. Her behavior is normal. Judgment and thought content normal.          Assessment & Plan:  Well exam. She will increase Celebrex to bid.

## 2013-12-08 ENCOUNTER — Encounter: Payer: Self-pay | Admitting: Gastroenterology

## 2014-01-03 ENCOUNTER — Ambulatory Visit (AMBULATORY_SURGERY_CENTER): Payer: Self-pay | Admitting: *Deleted

## 2014-01-03 VITALS — Ht 66.0 in | Wt 212.0 lb

## 2014-01-03 DIAGNOSIS — Z86010 Personal history of colonic polyps: Secondary | ICD-10-CM

## 2014-01-03 MED ORDER — MOVIPREP 100 G PO SOLR
ORAL | Status: DC
Start: 2014-01-03 — End: 2014-01-31

## 2014-01-03 NOTE — Progress Notes (Signed)
No allergies to eggs or soy. Problem with anesthesia: with knee arthroscopy 1995.  Had trouble with muscle weakness 24 hour after surgery.  Has had surgeries before that and after that with no problems with anesthesia. Pt given Emmi instructions for colonoscopy  No oxygen use  No diet drug use

## 2014-01-10 ENCOUNTER — Encounter: Payer: Self-pay | Admitting: Gastroenterology

## 2014-01-11 ENCOUNTER — Other Ambulatory Visit: Payer: Self-pay

## 2014-01-11 DIAGNOSIS — Z1231 Encounter for screening mammogram for malignant neoplasm of breast: Secondary | ICD-10-CM

## 2014-01-17 ENCOUNTER — Encounter: Payer: Medicare HMO | Admitting: Gastroenterology

## 2014-01-26 ENCOUNTER — Other Ambulatory Visit: Payer: Self-pay | Admitting: Family Medicine

## 2014-01-26 NOTE — Telephone Encounter (Signed)
Call in #[phone] with 5 rf 

## 2014-01-31 ENCOUNTER — Ambulatory Visit (AMBULATORY_SURGERY_CENTER): Payer: Medicare HMO | Admitting: Gastroenterology

## 2014-01-31 ENCOUNTER — Encounter: Payer: Self-pay | Admitting: Gastroenterology

## 2014-01-31 VITALS — BP 166/72 | HR 69 | Temp 96.5°F | Resp 19 | Ht 66.0 in | Wt 212.0 lb

## 2014-01-31 DIAGNOSIS — D125 Benign neoplasm of sigmoid colon: Secondary | ICD-10-CM

## 2014-01-31 DIAGNOSIS — Z860100 Personal history of colon polyps, unspecified: Secondary | ICD-10-CM

## 2014-01-31 DIAGNOSIS — D120 Benign neoplasm of cecum: Secondary | ICD-10-CM

## 2014-01-31 DIAGNOSIS — D126 Benign neoplasm of colon, unspecified: Secondary | ICD-10-CM

## 2014-01-31 HISTORY — PX: COLONOSCOPY: SHX174

## 2014-01-31 MED ORDER — SODIUM CHLORIDE 0.9 % IV SOLN: 500.0000 mL | INTRAVENOUS | Status: DC

## 2014-01-31 NOTE — Progress Notes (Signed)
Procedure ends, to recovery, report given and VSS. 

## 2014-01-31 NOTE — Patient Instructions (Signed)
Discharge instructions given with verbal understanding. Handouts on polyps and hemorrhoids. Resume previous medications. YOU HAD AN ENDOSCOPIC PROCEDURE TODAY AT [facility]: Refer to the procedure report that was given to you for any specific questions about what was found during the examination.  If the procedure report does not answer your questions, please call your gastroenterologist to clarify.  If you requested that your care partner not be given the details of your procedure findings, then the procedure report has been included in a sealed envelope for you to review at your convenience later.  YOU SHOULD EXPECT: Some feelings of bloating in the abdomen. Passage of more gas than usual.  Walking can help get rid of the air that was put into your GI tract during the procedure and reduce the bloating. If you had a lower endoscopy (such as a colonoscopy or flexible sigmoidoscopy) you may notice spotting of blood in your stool or on the toilet paper. If you underwent a bowel prep for your procedure, then you may not have a normal bowel movement for a few days.  DIET: Your first meal following the procedure should be a light meal and then it is ok to progress to your normal diet.  A half-sandwich or bowl of soup is an example of a good first meal.  Heavy or fried foods are harder to digest and may make you feel nauseous or bloated.  Likewise meals heavy in dairy and vegetables can cause extra gas to form and this can also increase the bloating.  Drink plenty of fluids but you should avoid alcoholic beverages for 24 hours.  ACTIVITY: Your care partner should take you home directly after the procedure.  You should plan to take it easy, moving slowly for the rest of the day.  You can resume normal activity the day after the procedure however you should NOT DRIVE or use heavy machinery for 24 hours (because of the sedation medicines used during the test).    SYMPTOMS TO REPORT  IMMEDIATELY: A gastroenterologist can be reached at any hour.  During normal business hours, 8:30 AM to 5:00 PM Monday through Friday, call [phone].  After hours and on weekends, please call the GI answering service at [phone] who will take a message and have the physician on call contact you.   Following lower endoscopy (colonoscopy or flexible sigmoidoscopy):  Excessive amounts of blood in the stool  Significant tenderness or worsening of abdominal pains  Swelling of the abdomen that is new, acute  Fever of 100F or higher  FOLLOW UP: If any biopsies were taken you will be contacted by phone or by letter within the next 1-3 weeks.  Call your gastroenterologist if you have not heard about the biopsies in 3 weeks.  Our staff will call the home number listed on your records the next business day following your procedure to check on you and address any questions or concerns that you may have at that time regarding the information given to you following your procedure. This is a courtesy call and so if there is no answer at the home number and we have not heard from you through the emergency physician on call, we will assume that you have returned to your regular daily activities without incident.  SIGNATURES/CONFIDENTIALITY: You and/or your care partner have signed paperwork which will be entered into your electronic medical record.  These signatures attest to the fact that that the information above on your After Visit Summary  has been reviewed and is understood.  Full responsibility of the confidentiality of this discharge information lies with you and/or your care-partner. 

## 2014-01-31 NOTE — Op Note (Signed)
[place]  [address] [place], [id number]   COLONOSCOPY PROCEDURE REPORT  PATIENT: Carleigh, Buccieri  MR#: [id number] BIRTHDATE: 05/17/1946 , 68  yrs. old GENDER: Female ENDOSCOPIST: Ladene Artist, MD, [facility] REFERRED BY:[Referring Physician] PROCEDURE DATE:  01/31/2014 PROCEDURE:   Colonoscopy with snare polypectomy First Screening Colonoscopy - Avg.  risk and is 50 yrs.  old or older - No.  Prior Negative Screening - Now for repeat screening. N/A  History of Adenoma - Now for follow-up colonoscopy & has been > or = to 3 yrs.  Yes hx of adenoma.  Has been 3 or more years since last colonoscopy.  Polyps Removed Today? Yes. ASA CLASS:   Class II INDICATIONS:Patient's personal history of adenomatous colon polyps.  MEDICATIONS: MAC sedation, administered by CRNA and propofol (Diprivan) 220mg  IV DESCRIPTION OF PROCEDURE:   After the risks benefits and alternatives of the procedure were thoroughly explained, informed consent was obtained.  A digital rectal exam revealed no abnormalities of the rectum.   The LB [id number]  endoscope was introduced through the anus and advanced to the cecum, which was identified by both the appendix and ileocecal valve. No adverse events experienced.   The quality of the prep was excellent, using MoviPrep  The instrument was then slowly withdrawn as the colon was fully examined.  COLON FINDINGS: A sessile polyp measuring 6 mm in size was found at the cecum.  A polypectomy was performed with a cold snare.  The resection was complete and the polyp tissue was completely retrieved.   A sessile polyp measuring 4 mm in size was found in the sigmoid colon.  A polypectomy was performed with a cold snare. The resection was complete and the polyp tissue was not retrieved. The colon was otherwise normal.  There was no diverticulosis, inflammation, polyps or cancers unless previously stated. Retroflexed views revealed small internal  hemorrhoids. The time to cecum=3 minutes 12 seconds.  Withdrawal time=11 minutes 06 seconds. The scope was withdrawn and the procedure completed. COMPLICATIONS: There were no complications.  ENDOSCOPIC IMPRESSION: 1.   Sessile polyp measuring 6 mm at the cecum; polypectomy performed with a cold snare 2.   Sessile polyp measuring 4 mm in the sigmoid colon; polypectomy performed with a cold snare 3.   Small internal hemorrhoids  RECOMMENDATIONS: 1.  Await pathology results 2.  Repeat colonoscopy in 5 years if polyp adenomatous; otherwise 10 years  eSigned:  Ladene Artist, MD, [facility] 01/31/2014 3:02 PM      PATIENT NAME:  Deshundra, Waller MR#: [id number]

## 2014-01-31 NOTE — Progress Notes (Signed)
Called to room to assist during endoscopic procedure.  Patient ID and intended procedure confirmed with present staff. Received instructions for my participation in the procedure from the performing physician.  

## 2014-02-01 ENCOUNTER — Telehealth: Payer: Self-pay | Admitting: *Deleted

## 2014-02-01 NOTE — Telephone Encounter (Signed)
Left message that we called for f/u 

## 2014-02-06 ENCOUNTER — Encounter: Payer: Self-pay | Admitting: Gastroenterology

## 2014-02-17 ENCOUNTER — Encounter: Payer: Self-pay | Admitting: Hematology and Oncology

## 2014-02-17 ENCOUNTER — Ambulatory Visit: Payer: Medicare HMO

## 2014-02-17 ENCOUNTER — Telehealth: Payer: Self-pay | Admitting: Hematology and Oncology

## 2014-02-17 ENCOUNTER — Ambulatory Visit (HOSPITAL_BASED_OUTPATIENT_CLINIC_OR_DEPARTMENT_OTHER): Payer: Medicare HMO | Admitting: Hematology and Oncology

## 2014-02-17 ENCOUNTER — Other Ambulatory Visit: Payer: Medicare HMO

## 2014-02-17 VITALS — BP 132/72 | HR 71 | Temp 97.6°F | Resp 18 | Ht 66.0 in | Wt 212.3 lb

## 2014-02-17 DIAGNOSIS — C50212 Malignant neoplasm of upper-inner quadrant of left female breast: Secondary | ICD-10-CM

## 2014-02-17 DIAGNOSIS — Z853 Personal history of malignant neoplasm of breast: Secondary | ICD-10-CM

## 2014-02-17 LAB — CBC WITH DIFFERENTIAL/PLATELET
BASO%: 0.8 % (ref 0.0–2.0)
Basophils Absolute: 0.1 10*3/uL (ref 0.0–0.1)
EOS ABS: 0.2 10*3/uL (ref 0.0–0.5)
EOS%: 3.7 % (ref 0.0–7.0)
HEMATOCRIT: 36.4 % (ref 34.8–46.6)
HEMOGLOBIN: 12.1 g/dL (ref 11.6–15.9)
LYMPH#: 1.3 10*3/uL (ref 0.9–3.3)
LYMPH%: 21 % (ref 14.0–49.7)
MCH: 28.3 pg (ref 25.1–34.0)
MCHC: 33.2 g/dL (ref 31.5–36.0)
MCV: 85.3 fL (ref 79.5–101.0)
MONO#: 0.5 10*3/uL (ref 0.1–0.9)
MONO%: 7.3 % (ref 0.0–14.0)
NEUT%: 67.2 % (ref 38.4–76.8)
NEUTROS ABS: 4.3 10*3/uL (ref 1.5–6.5)
PLATELETS: 193 10*3/uL (ref 145–400)
RBC: 4.28 10*6/uL (ref 3.70–5.45)
RDW: 14.5 % (ref 11.2–14.5)
WBC: 6.4 10*3/uL (ref 3.9–10.3)

## 2014-02-17 LAB — COMPREHENSIVE METABOLIC PANEL (CC13)
ALT: 18 U/L (ref 0–55)
AST: 20 U/L (ref 5–34)
Albumin: 3.7 g/dL (ref 3.5–5.0)
Alkaline Phosphatase: 96 U/L (ref 40–150)
Anion Gap: 6 mEq/L (ref 3–11)
BUN: 19.8 mg/dL (ref 7.0–26.0)
CALCIUM: 9.5 mg/dL (ref 8.4–10.4)
CHLORIDE: 104 meq/L (ref 98–109)
CO2: 30 mEq/L — ABNORMAL HIGH (ref 22–29)
CREATININE: 1.1 mg/dL (ref 0.6–1.1)
GLUCOSE: 97 mg/dL (ref 70–140)
Potassium: 4 mEq/L (ref 3.5–5.1)
Sodium: 141 mEq/L (ref 136–145)
Total Bilirubin: 0.42 mg/dL (ref 0.20–1.20)
Total Protein: 6.9 g/dL (ref 6.4–8.3)

## 2014-02-17 MED ORDER — INFLUENZA VAC SPLIT QUAD 0.5 ML IM SUSY
0.5000 mL | PREFILLED_SYRINGE | Freq: Once | INTRAMUSCULAR | Status: DC
  Filled 2014-02-17: qty 0.5

## 2014-02-17 NOTE — Progress Notes (Signed)
Patient Care Team: Laurey Morale, MD as PCP - General  DIAGNOSIS: multifocal invasive ductal carcinoma of the left breast status post left modified radical mastectomy with axillary lymph node dissection performed in November 2011. The tumor was ER negative PR negative HER-2/neu negative with a proliferation marker 18%   SUMMARY OF ONCOLOGIC HISTORY:  #1 patient underwent a mastectomy of the left breast with axillary lymph node dissection November 2011. She was found to have multifocal disease. The node dissection was negative for metastatic disease. Tumor was ER negative PR negative HER-2/neu negative proliferation marker 18%. She had immediate reconstruction.  #2 patient then received 4 cycles of adjuvant chemotherapy initially consisting of dose dense Adriamycin and Cytoxan from 04/25/2010 to 06/06/2010.  #3 she then received Taxol and carboplatinum from 06/21/2010 to 08/06/2010. She received a total of 7 cycles of combination. Patient developed peripheral paresthesias and severe myelo suppression. Thus carboplatin was discontinued and she went on to complete single agent Taxol from 08/20/2010 2 09/20/2010. Patient completed a total of 12 weeks of taxane containing regimen  #3 S/P left breast reconstruction by Dr. Crissie Reese.   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 She had a left-sided mastectomy followed by immediate reconstruction she had adjuvant chemotherapy and is here for six-month followup. She reports no new problems or concerns. She'll not allergies and chronic back pain issues as well as a recent history of hip fracture which is now repaired. She reports that occasionally the left arm swells up but the it gets better when she is not doing so much physical activity on the job site. The  hip fracture was also due to work related injury.  REVIEW OF SYSTEMS:   Constitutional: Denies fevers, chills or abnormal weight loss Eyes: Denies blurriness of vision Ears, nose, mouth, throat, and face: Denies mucositis or sore throat Respiratory: Denies cough, dyspnea or wheezes Cardiovascular: Denies palpitation, chest discomfort or lower extremity swelling Gastrointestinal:  Denies nausea, heartburn or change in bowel habits Skin: Denies abnormal skin rashes Lymphatics: Denies new lymphadenopathy or easy bruising Neurological:Denies numbness, tingling or new weaknesses Behavioral/Psych: Mood is stable, no new changes  Breast:  denies any pain or lumps or nodules in either breasts All other systems were reviewed with the patient and are negative.  I have reviewed the past medical history, past surgical history, social history and family history with the patient and they are unchanged from previous note.  ALLERGIES:  is allergic to dust mite extract; mold extract; other; and succinylcholine.  MEDICATIONS:  Current Outpatient Prescriptions  Medication Sig Dispense Refill  . acyclovir (ZOVIRAX) 400 MG tablet TAKE 1 TABLET BY MOUTH EVERY 8 HOURS AS NEEDED FOR FEVER BLISTERS  60 tablet  3  . albuterol (PROVENTIL HFA;VENTOLIN HFA) 108 (90 BASE) MCG/ACT inhaler Inhale 2 puffs into the lungs every 4 (four) hours as needed for wheezing or shortness of breath.  1 Inhaler  3  . ALPRAZolam (XANAX) 0.5 MG tablet TAKE 1 TABLET 3 TIMES A DAY AS NEEDED  60 tablet  5  . celecoxib (CELEBREX) 200 MG capsule Take 1 capsule (200 mg total) by mouth 2 (two) times daily.  180 capsule  3  . cyclobenzaprine (FLEXERIL) 10 MG tablet TAKE 1 TABLET BY MOUTH 3 TIMES A DAY AS NEEDED FOR MUSCLE SPASMS  60 tablet  3  . enalapril-hydrochlorothiazide (VASERETIC) 10-25 MG per tablet TAKE 1 TABLET BY MOUTH DAILY.  90 tablet  3  . esomeprazole (NEXIUM) 40 MG capsule TAKE ONE CAPSULE BY MOUTH EVERY DAY  90 capsule  3  .  HYDROcodone-acetaminophen (NORCO) 10-325 MG per tablet Take 1 tablet by mouth every 6 (six) hours as needed for pain.  60 tablet  0  . metoprolol succinate (TOPROL-XL) 50 MG 24 hr tablet TAKE 1 TABLET (50 MG TOTAL) BY MOUTH DAILY. TAKE WITH OR IMMEDIATELY FOLLOWING A MEAL.  90 tablet  3  . Multiple Vitamins-Minerals (CENTRUM ULTRA WOMENS) TABS Take 1 tablet by mouth daily.        . potassium chloride (KLOR-CON 10) 10 MEQ tablet Take 1 tablet (10 mEq total) by mouth daily.  90 tablet  3  . sertraline (ZOLOFT) 100 MG tablet TAKE 1 TABLET BY MOUTH DAILY.  90 tablet  3  . simvastatin (ZOCOR) 40 MG tablet TAKE 1 TABLET BY MOUTH EVERY EVENING  90 tablet  3   Current Facility-Administered Medications  Medication Dose Route Frequency Provider Last Rate Last Dose  . Influenza vac split quadrivalent PF (FLUARIX) injection 0.5 mL  0.5 mL Intramuscular Once Rulon Eisenmenger, MD        PHYSICAL EXAMINATION: ECOG PERFORMANCE STATUS: 1 - Symptomatic but completely ambulatory  Filed Vitals:   02/17/14 1407  BP: 132/72  Pulse: 71  Temp: 97.6 F (36.4 C)  Resp: 18   Filed Weights   02/17/14 1407  Weight: 212 lb 4.8 oz (96.299 kg)    GENERAL:alert, no distress and comfortable SKIN: skin color, texture, turgor are normal, no rashes or significant lesions EYES: normal, Conjunctiva are pink and non-injected, sclera clear OROPHARYNX:no exudate, no erythema and lips, buccal mucosa, and tongue normal  NECK: supple, thyroid normal size, non-tender, without nodularity LYMPH:  no palpable lymphadenopathy in the cervical, axillary or inguinal LUNGS: clear to auscultation and percussion with normal breathing effort HEART: regular rate & rhythm and no murmurs and no lower extremity edema ABDOMEN:abdomen soft, non-tender and normal bowel sounds Musculoskeletal:no cyanosis of digits and no clubbing  NEURO: alert & oriented x 3 with fluent speech, no focal motor/sensory deficits BREAST: No palpable masses or  nodules in either right or left breasts. The left breast reconstruction area is without any nodularity or lymphadenopathy No palpable axillary supraclavicular or infraclavicular adenopathy no breast tenderness or nipple discharge.   LABORATORY DATA:  I have reviewed the data as listed   Chemistry      Component Value Date/Time   NA 141 11/30/2013 0821   NA 142 08/11/2013 1310   K 3.8 11/30/2013 0821   K 3.9 08/11/2013 1310   CL 103 11/30/2013 0821   CL 104 09/09/2012 0935   CO2 29 11/30/2013 0821   CO2 27 08/11/2013 1310   BUN 20 11/30/2013 0821   BUN 18.3 08/11/2013 1310   CREATININE 0.8 11/30/2013 0821   CREATININE 0.8 08/11/2013 1310      Component Value Date/Time   CALCIUM 9.3 11/30/2013 0821   CALCIUM 9.8 08/11/2013 1310   ALKPHOS 77 11/30/2013 0821   ALKPHOS 97 08/11/2013 1310   AST 21 11/30/2013 0821   AST 22 08/11/2013 1310   ALT 20 11/30/2013 0821   ALT 20 08/11/2013 1310   BILITOT 0.7 11/30/2013 0821   BILITOT 0.48 08/11/2013 1310       Lab Results  Component Value Date   WBC 4.8 11/30/2013   HGB 11.8*  11/30/2013   HCT 35.7* 11/30/2013   MCV 83.8 11/30/2013   PLT 174.0 11/30/2013   NEUTROABS 3.3 11/30/2013     RADIOGRAPHIC STUDIES: I have personally reviewed the radiology reports and agreed with their findings. No results found.   ASSESSMENT & PLAN:  Breast cancer of upper-inner quadrant of left female breast Left breast multifocal invasive ductal carcinoma status post left mastectomy with axillary lymph node dissection November 2000 and triple negative disease with Ki-67 13% followed by immediate reconstruction status post adjuvant chemotherapy with a.c. followed by Taxol carbo develop neuropathy and myelosuppression  Surveillance: Patient is currently on surveillance for breast cancer mammograms were done in October 2014 which were normal she will undergo a mammogram in the next 2 weeks on the right breast. Today's physical exam does not reveal any lumps or nodules. She does  have mild swelling and axilla but no palpable nodularity.  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 But she stays active even though she does not do formal exercise. Patient is eating well and maintaining her weight.   Orders Placed This Encounter  Procedures  . CBC with Differential    Standing Status: Future     Number of Occurrences:      Standing Expiration Date: 02/17/2015  . Comprehensive metabolic panel (Cmet) - [facility]    Standing Status: Future     Number of Occurrences:      Standing Expiration Date: 02/17/2015  . CBC with Differential  . Comprehensive metabolic panel (Cmet) - [facility]   The patient has a good understanding of the overall plan. she agrees with it. She will call with any problems that may develop before her next visit here.  I spent 15 minutes counseling the patient face to face. The total time spent in the appointment was 20 minutes and more than 50% was on counseling and review of test results    Rulon Eisenmenger, MD 02/17/2014 2:51 PM

## 2014-02-17 NOTE — Telephone Encounter (Signed)
per pof to sch appt-gave pt copy of sch °

## 2014-02-17 NOTE — Assessment & Plan Note (Signed)
Left breast multifocal invasive ductal carcinoma status post left mastectomy with axillary lymph node dissection November 2000 and triple negative disease with Ki-67 13% followed by immediate reconstruction status post adjuvant chemotherapy with a.c. followed by Taxol carbo develop neuropathy and myelosuppression  Surveillance: Patient is currently on surveillance for breast cancer mammograms were done in October 2014 which were normal she will undergo a mammogram in the next 2 weeks on the right breast. Today's physical exam does not reveal any lumps or nodules. She does have mild swelling and axilla but no palpable nodularity.  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 But she stays active even though she does not do formal exercise. Patient is eating well and maintaining her weight.

## 2014-02-28 ENCOUNTER — Encounter: Payer: Self-pay | Admitting: Gastroenterology

## 2014-02-28 ENCOUNTER — Ambulatory Visit
Admission: RE | Admit: 2014-02-28 | Discharge: 2014-02-28 | Disposition: A | Discharge status: Home / Self Care | Payer: Medicare HMO | Source: Ambulatory Visit

## 2014-02-28 DIAGNOSIS — Z1231 Encounter for screening mammogram for malignant neoplasm of breast: Secondary | ICD-10-CM

## 2014-03-13 ENCOUNTER — Other Ambulatory Visit: Payer: Self-pay | Admitting: Family Medicine

## 2014-03-17 ENCOUNTER — Other Ambulatory Visit: Payer: Self-pay | Admitting: Obstetrics and Gynecology

## 2014-03-18 ENCOUNTER — Other Ambulatory Visit: Payer: Self-pay | Admitting: Family Medicine

## 2014-03-18 LAB — CYTOLOGY - PAP

## 2014-03-22 ENCOUNTER — Other Ambulatory Visit: Payer: Self-pay | Admitting: Family Medicine

## 2014-03-28 ENCOUNTER — Other Ambulatory Visit: Payer: Self-pay | Admitting: Family Medicine

## 2014-04-05 ENCOUNTER — Other Ambulatory Visit: Payer: Self-pay | Admitting: Family Medicine

## 2014-04-10 ENCOUNTER — Other Ambulatory Visit: Payer: Self-pay | Admitting: Family Medicine

## 2014-04-12 ENCOUNTER — Telehealth: Payer: Self-pay | Admitting: Family Medicine

## 2014-04-12 NOTE — Telephone Encounter (Signed)
Pt needs refill on nexium 40 mg call into [facility]

## 2014-04-13 MED ORDER — ESOMEPRAZOLE MAGNESIUM 40 MG PO CPDR: DELAYED_RELEASE_CAPSULE | ORAL | Status: DC

## 2014-04-13 NOTE — Telephone Encounter (Signed)
I had to resend this script, pharmacy said that they never received order back in July. I sent e-scribe and spoke with pt.

## 2014-05-10 ENCOUNTER — Ambulatory Visit (INDEPENDENT_AMBULATORY_CARE_PROVIDER_SITE_OTHER): Payer: Medicare HMO | Admitting: Family Medicine

## 2014-05-10 ENCOUNTER — Encounter: Payer: Self-pay | Admitting: Family Medicine

## 2014-05-10 VITALS — BP 154/88 | HR 91 | Temp 98.2°F | Ht 66.0 in | Wt 215.0 lb

## 2014-05-10 DIAGNOSIS — I10 Essential (primary) hypertension: Secondary | ICD-10-CM

## 2014-05-10 DIAGNOSIS — J209 Acute bronchitis, unspecified: Secondary | ICD-10-CM

## 2014-05-10 MED ORDER — HYDROCODONE-HOMATROPINE 5-1.5 MG/5ML PO SYRP: 5.0000 mL | ORAL_SOLUTION | ORAL | Status: DC | PRN

## 2014-05-10 MED ORDER — METOPROLOL SUCCINATE ER 25 MG PO TB24: 25.0000 mg | ORAL_TABLET | Freq: Every day | ORAL | Status: DC

## 2014-05-10 MED ORDER — LEVOFLOXACIN 500 MG PO TABS: 500.0000 mg | ORAL_TABLET | Freq: Every day | ORAL | Status: AC

## 2014-05-10 NOTE — Progress Notes (Signed)
Pre visit review using our clinic review tool, if applicable. No additional management support is needed unless otherwise documented below in the visit note. 

## 2014-05-10 NOTE — Progress Notes (Signed)
   Subjective:    Patient ID: Rebekah Moreno, female    DOB: 1946/03/25, 68 y.o.   MRN: [id number]  HPI Here for one week of chest tightness and coughing up green sputum. Using Delsym. She also thinks her BP meds are too strong. She often feels tired and her BP may be as low as 110/60.   Review of Systems  Constitutional: Positive for fatigue. Negative for fever.  HENT: Positive for congestion and postnasal drip. Negative for sinus pressure.   Eyes: Negative.   Respiratory: Positive for cough, chest tightness and shortness of breath. Negative for wheezing.   Cardiovascular: Negative.        Objective:   Physical Exam  Constitutional: She appears well-developed and well-nourished.  HENT:  Right Ear: External ear normal.  Left Ear: External ear normal.  Nose: Nose normal.  Mouth/Throat: Oropharynx is clear and moist.  Eyes: Conjunctivae are normal.  Cardiovascular: Normal rate, regular rhythm, normal heart sounds and intact distal pulses.   Pulmonary/Chest: Effort normal and breath sounds normal. No respiratory distress. She has no wheezes. She has no rales.  Lymphadenopathy:    She has no cervical adenopathy.          Assessment & Plan:  Treat the bronchitis with Levaquin. Reduced the dose of metoprolol succinate to 25 mg daily.

## 2014-06-06 ENCOUNTER — Ambulatory Visit (INDEPENDENT_AMBULATORY_CARE_PROVIDER_SITE_OTHER): Payer: Medicare HMO | Admitting: Family Medicine

## 2014-06-06 ENCOUNTER — Encounter: Payer: Self-pay | Admitting: Family Medicine

## 2014-06-06 VITALS — BP 131/79 | HR 91 | Temp 99.0°F | Ht 66.0 in | Wt 210.0 lb

## 2014-06-06 DIAGNOSIS — J209 Acute bronchitis, unspecified: Secondary | ICD-10-CM

## 2014-06-06 MED ORDER — ALPRAZOLAM 0.5 MG PO TABS: 0.5000 mg | ORAL_TABLET | Freq: Three times a day (TID) | ORAL | Status: DC | PRN

## 2014-06-06 MED ORDER — AMOXICILLIN-POT CLAVULANATE 875-125 MG PO TABS: 1.0000 | ORAL_TABLET | Freq: Two times a day (BID) | ORAL | Status: DC

## 2014-06-06 NOTE — Progress Notes (Signed)
   Subjective:    Patient ID: Rebekah Moreno, female    DOB: 12-04-1945, 69 y.o.   MRN: [id number]  HPI Here for a recurrence of URI sx including chest congestion, PND, and coughing up yellow sputum.    Review of Systems  Constitutional: Negative.   HENT: Positive for congestion and postnasal drip. Negative for sinus pressure.   Eyes: Negative.   Respiratory: Positive for cough, chest tightness and wheezing.        Objective:   Physical Exam  Constitutional: She appears well-developed and well-nourished.  HENT:  Right Ear: External ear normal.  Left Ear: External ear normal.  Nose: Nose normal.  Mouth/Throat: Oropharynx is clear and moist.  Eyes: Conjunctivae are normal.  Pulmonary/Chest: Effort normal. No respiratory distress. She has no wheezes. She has no rales.  Scattered rhonchi   Lymphadenopathy:    She has no cervical adenopathy.          Assessment & Plan:  Add Mucinex

## 2014-06-06 NOTE — Progress Notes (Signed)
Pre visit review using our clinic review tool, if applicable. No additional management support is needed unless otherwise documented below in the visit note. 

## 2014-06-14 ENCOUNTER — Telehealth: Payer: Self-pay | Admitting: Hematology and Oncology

## 2014-06-14 NOTE — Telephone Encounter (Signed)
due to call day 4/14 appt moved to 4/13. s/w pt she is aware.

## 2014-06-22 ENCOUNTER — Ambulatory Visit (INDEPENDENT_AMBULATORY_CARE_PROVIDER_SITE_OTHER): Payer: Medicare HMO | Admitting: Family Medicine

## 2014-06-22 DIAGNOSIS — Z23 Encounter for immunization: Secondary | ICD-10-CM

## 2014-06-24 ENCOUNTER — Telehealth: Payer: Self-pay | Admitting: Family Medicine

## 2014-06-24 NOTE — Telephone Encounter (Signed)
[facility] Patient Name: Rebekah Moreno DOB: [birth year] Initial Comment caller states that she was in office for flu shot, woke up queasy and has diarrhea, lost 4 pounds Nurse Assessment Nurse: Fredderick Severance, RN, Mark Date/Time (Eastern Time): 06/24/2014 3:38:19 PM Confirm and document reason for call. If symptomatic, describe symptoms. ---caller states the day before yesterday had flu shot, and yesterday feeling queasy and vomiting x 6, temp 98.2 orally Has the patient traveled out of the country within the last 30 days? ---No Does the patient require triage? ---Yes Related visit to physician within the last 2 weeks? ---No Does the PT have any chronic conditions? (i.e. diabetes, asthma, etc.) ---Yes List chronic conditions. ---asthma, htn, breast cancer, Nurse: Mechele Dawley, RN, Amy Date/Time (Eastern Time): 06/24/2014 4:46:54 PM Confirm and document reason for call. If symptomatic, describe symptoms. ---CALLER CONFIRMS THAT SHE HAD GOT A FLU SHOT ON WED. SHE STARTED VOMITING LAST NIGHT ABOUT 6 TIMES. IT HAS SINCE SUBSIDED AND NO MORE VOMITING. SHE DOES NOT FEEL DEHYDRATED. SHE IS HAVING URINARY OUTPUT, DRINKING GINGER ALE. SHE IS JUST CONTINUING TO HAVE DIARRHEA. Has the patient traveled out of the country within the last 30 days? ---No Does the patient require triage? ---Yes Related visit to physician within the last 2 weeks? ---No Does the PT have any chronic conditions? (i.e. diabetes, asthma, etc.) ---Yes List chronic conditions. ---ASTHMA, HTN, BREAST CA Guidelines Guideline Title Affirmed Question Affirmed Notes Vomiting [1] MODERATE vomiting (e.g., 3 - 5 times/ day) AND [2] age > 60 Vomiting [1] MILD or MODERATE vomiting AND [2] present > 48 hours (2 days) (Exception: mild vomiting with associated diarrhea) Final Disposition User See Physician within [place], Therapist, sports, Amy

## 2014-06-24 NOTE — Telephone Encounter (Signed)
I spoke with pt and advised to either go to ER or schedule for Saturday clinic at [place] prefers to be seen at [facility] in the morning. I put pt on hold and Derinda Late will schedule this.

## 2014-06-25 ENCOUNTER — Encounter: Payer: Medicare HMO | Admitting: Family Medicine

## 2014-06-25 NOTE — Progress Notes (Signed)
   Subjective:    Patient ID: Rebekah Moreno, female    DOB: 1945-09-21, 69 y.o.   MRN: [id number]  HPI    Review of Systems     Objective:   Physical Exam        Assessment & Plan:   This encounter was created in error - please disregard.

## 2014-06-28 ENCOUNTER — Telehealth: Payer: Self-pay | Admitting: Family Medicine

## 2014-06-28 NOTE — Telephone Encounter (Signed)
Noted  

## 2014-06-28 NOTE — Telephone Encounter (Signed)
Patient Name: Rebekah Moreno  DOB: [birth year]    Initial Comment Caller states she has hives all over. Last week she had a viral infection- Diarrhea.   Nurse Assessment  Nurse: Vallery Sa, RN, Cathy Date/Time (Eastern Time): 06/28/2014 11:52:11 AM  Confirm and document reason for call. If symptomatic, describe symptoms. ---Caller states she developed all-over Hives last night. No severe breathing or swallowing difficulty. No known fever.  Has the patient traveled out of the country within the last 30 days? ---No  Does the patient require triage? ---Yes  Related visit to physician within the last 2 weeks? ---No  Does the PT have any chronic conditions? (i.e. diabetes, asthma, etc.) ---Yes  List chronic conditions. ---Asthma, High Blood Pressure, Breast Cancer 2011     Guidelines    Guideline Title Affirmed Question Affirmed Notes  Hives Joint swelling    Final Disposition User   See Physician within [place], RN, [place]    Comments  Scheduled for 06/29/13 10:30am appointment with Dr. Sarajane Jews. Advised to call back for any new or worsening symptoms or if she has any concerns or questions before seeing MD.

## 2014-06-29 ENCOUNTER — Ambulatory Visit: Payer: Self-pay | Admitting: Family Medicine

## 2014-08-31 ENCOUNTER — Ambulatory Visit (HOSPITAL_BASED_OUTPATIENT_CLINIC_OR_DEPARTMENT_OTHER): Payer: Medicare HMO | Admitting: Hematology and Oncology

## 2014-08-31 DIAGNOSIS — Z853 Personal history of malignant neoplasm of breast: Secondary | ICD-10-CM | POA: Diagnosis not present

## 2014-08-31 DIAGNOSIS — C50212 Malignant neoplasm of upper-inner quadrant of left female breast: Secondary | ICD-10-CM

## 2014-08-31 NOTE — Progress Notes (Signed)
Patient Care Team: Laurey Morale, MD as PCP - General  DIAGNOSIS: multifocal invasive ductal carcinoma of the left breast status post left modified radical mastectomy with axillary lymph node dissection performed in November 2011. The tumor was ER negative PR negative HER-2/neu negative with a proliferation marker 18%   SUMMARY OF ONCOLOGIC HISTORY:  #1 patient underwent a mastectomy of the left breast with axillary lymph node dissection November 2011. She was found to have multifocal disease. The node dissection was negative for metastatic disease. Tumor was ER negative PR negative HER-2/neu negative proliferation marker 18%. She had immediate reconstruction.  #2 patient then received 4 cycles of adjuvant chemotherapy initially consisting of dose dense Adriamycin and Cytoxan from 04/25/2010 to 06/06/2010.  #3 she then received Taxol and carboplatinum from 06/21/2010 to 08/06/2010. She received a total of 7 cycles of combination. Patient developed peripheral paresthesias and severe myelo suppression. Thus carboplatin was discontinued and she went on to complete single agent Taxol from 08/20/2010 2 09/20/2010. Patient completed a total of 12 weeks of taxane containing regimen  #3 S/P left breast reconstruction by Dr. Crissie Reese.   CURRENT THERAPY:Observation CHIEF COMPLIANT: Follow-up left breast cancer  INTERVAL HISTORY: Rebekah Moreno is a 69 year old with above-mentioned history of left breast cancer treated with mastectomy followed by adjuvant chemotherapy and is currently on observation for triple negative disease. She appears to be doing quite well without any problems or concern. Denies any lumps or nodules in the breasts.  REVIEW OF SYSTEMS:   Constitutional: Denies fevers, chills or abnormal weight loss Eyes: Denies blurriness of vision Ears, nose, mouth, throat, and face: Denies mucositis or sore throat Respiratory: Denies cough, dyspnea or wheezes Cardiovascular: Denies  palpitation, chest discomfort or lower extremity swelling Gastrointestinal:  Denies nausea, heartburn or change in bowel habits Skin: Denies abnormal skin rashes Lymphatics: Denies new lymphadenopathy or easy bruising Neurological:Denies numbness, tingling or new weaknesses Behavioral/Psych: Mood is stable, no new changes  Breast:  denies any pain or lumps or nodules in either breasts All other systems were reviewed with the patient and are negative.  I have reviewed the past medical history, past surgical history, social history and family history with the patient and they are unchanged from previous note.  ALLERGIES:  is allergic to dust mite extract; mold extract; other; and succinylcholine.  MEDICATIONS:  Current Outpatient Prescriptions  Medication Sig Dispense Refill  . acyclovir (ZOVIRAX) 400 MG tablet TAKE 1 TABLET BY MOUTH EVERY 8 HOURS AS NEEDED FOR FEVER BLISTERS 60 tablet 3  . albuterol (PROVENTIL HFA;VENTOLIN HFA) 108 (90 BASE) MCG/ACT inhaler Inhale 2 puffs into the lungs every 4 (four) hours as needed for wheezing or shortness of breath. 1 Inhaler 3  . ALPRAZolam (XANAX) 0.5 MG tablet Take 1 tablet (0.5 mg total) by mouth 3 (three) times daily as needed for anxiety. 90 tablet 5  . celecoxib (CELEBREX) 200 MG capsule Take 1 capsule (200 mg total) by mouth 2 (two) times daily. 180 capsule 3  . enalapril-hydrochlorothiazide (VASERETIC) 10-25 MG per tablet TAKE 1 TABLET BY MOUTH DAILY. 90 tablet 3  . esomeprazole (NEXIUM) 40 MG capsule TAKE ONE CAPSULE BY MOUTH EVERY DAY 90 capsule 3  . metoprolol succinate (TOPROL-XL) 25 MG 24 hr tablet Take 1 tablet (25 mg total) by mouth daily. 90 tablet 3  . Multiple Vitamins-Minerals (CENTRUM ULTRA WOMENS) TABS Take 1 tablet by mouth daily.      . potassium chloride (KLOR-CON 10) 10 MEQ tablet Take 1 tablet (10  mEq total) by mouth daily. 90 tablet 3  . sertraline (ZOLOFT) 100 MG tablet TAKE 1 TABLET BY MOUTH DAILY. 90 tablet 3  . simvastatin  (ZOCOR) 40 MG tablet TAKE 1 TABLET BY MOUTH EVERY EVENING 90 tablet 3  . cyclobenzaprine (FLEXERIL) 10 MG tablet TAKE 1 TABLET BY MOUTH 3 TIMES A DAY AS NEEDED FOR MUSCLE SPASMS (Patient not taking: Reported on 05/10/2014) 60 tablet 3  . HYDROcodone-acetaminophen (NORCO) 10-325 MG per tablet Take 1 tablet by mouth every 6 (six) hours as needed for pain. (Patient not taking: Reported on 05/10/2014) 60 tablet 0  . HYDROcodone-homatropine (HYDROMET) 5-1.5 MG/5ML syrup Take 5 mLs by mouth every 4 (four) hours as needed. (Patient not taking: Reported on 06/06/2014) 240 mL 0   No current facility-administered medications for this visit.    PHYSICAL EXAMINATION: ECOG PERFORMANCE STATUS: 0 - Asymptomatic  Filed Vitals:   08/31/14 1436  BP: 138/62  Pulse: 73  Temp: 98.5 F (36.9 C)  Resp: 18   Filed Weights   08/31/14 1436  Weight: 212 lb 1.6 oz (96.208 kg)    GENERAL:alert, no distress and comfortable SKIN: skin color, texture, turgor are normal, no rashes or significant lesions EYES: normal, Conjunctiva are pink and non-injected, sclera clear OROPHARYNX:no exudate, no erythema and lips, buccal mucosa, and tongue normal  NECK: supple, thyroid normal size, non-tender, without nodularity LYMPH:  no palpable lymphadenopathy in the cervical, axillary or inguinal LUNGS: clear to auscultation and percussion with normal breathing effort HEART: regular rate & rhythm and no murmurs and no lower extremity edema ABDOMEN:abdomen soft, non-tender and normal bowel sounds Musculoskeletal:no cyanosis of digits and no clubbing  NEURO: alert & oriented x 3 with fluent speech, no focal motor/sensory deficits BREAST: No palpable masses or nodules in either right or left breasts. No palpable axillary supraclavicular or infraclavicular adenopathy no breast tenderness or nipple discharge. (exam performed in the presence of a chaperone)  LABORATORY DATA:  I have reviewed the data as listed   Chemistry       Component Value Date/Time   NA 141 02/17/2014 1505   NA 141 11/30/2013 0821   K 4.0 02/17/2014 1505   K 3.8 11/30/2013 0821   CL 103 11/30/2013 0821   CL 104 09/09/2012 0935   CO2 30* 02/17/2014 1505   CO2 29 11/30/2013 0821   BUN 19.8 02/17/2014 1505   BUN 20 11/30/2013 0821   CREATININE 1.1 02/17/2014 1505   CREATININE 0.8 11/30/2013 0821      Component Value Date/Time   CALCIUM 9.5 02/17/2014 1505   CALCIUM 9.3 11/30/2013 0821   ALKPHOS 96 02/17/2014 1505   ALKPHOS 77 11/30/2013 0821   AST 20 02/17/2014 1505   AST 21 11/30/2013 0821   ALT 18 02/17/2014 1505   ALT 20 11/30/2013 0821   BILITOT 0.42 02/17/2014 1505   BILITOT 0.7 11/30/2013 0821       Lab Results  Component Value Date   WBC 6.4 02/17/2014   HGB 12.1 02/17/2014   HCT 36.4 02/17/2014   MCV 85.3 02/17/2014   PLT 193 02/17/2014   NEUTROABS 4.3 02/17/2014     RADIOGRAPHIC STUDIES: I have personally reviewed the radiology reports and agreed with their findings. Mammogram 03/01/2014 is normal  ASSESSMENT & PLAN:  Left breast multifocal invasive ductal carcinoma status post left mastectomy with axillary lymph node dissection November 2011 and triple negative disease with Ki-67 13% followed by immediate reconstruction status post adjuvant chemotherapy with a.c. followed by Taxol  carbo develop neuropathy and myelosuppression  Surveillance:  1. Breast exam 08/31/2014 is normal 2. Mammogram 03/01/2014 is normal  Survivorship:Discussed the importance of physical exercise in decreasing the likelihood of breast cancer recurrence. Recommended 30 mins daily 6 days a week of either brisk walking or cycling or swimming. Encouraged patient to eat more fruits and vegetables and decrease red meat.     Orders Placed This Encounter  Procedures  . CBC with Differential    Standing Status: Future     Number of Occurrences:      Standing Expiration Date: 08/31/2015  . Comprehensive metabolic panel (Cmet) - [facility]     Standing Status: Future     Number of Occurrences:      Standing Expiration Date: 08/31/2015   The patient has a good understanding of the overall plan. she agrees with it. She will call with any problems that may develop before her next visit here.   Rulon Eisenmenger, MD

## 2014-09-01 ENCOUNTER — Ambulatory Visit: Payer: Medicare HMO | Admitting: Hematology and Oncology

## 2014-09-06 ENCOUNTER — Telehealth: Payer: Self-pay | Admitting: Family Medicine

## 2014-09-06 NOTE — Telephone Encounter (Signed)
Please tel them she has tried and failed Omeprazole already

## 2014-09-06 NOTE — Telephone Encounter (Signed)
PA was received for Nexium 40 was received. Patient's plan requires patient to try and fail omeprazole.  Patient can receive omeprazole 40 with out a PA.

## 2014-09-06 NOTE — Telephone Encounter (Signed)
I spoke with Rebekah Moreno and she said that she has tried another medication in the past and it did not work, she prefers to stay on the Nexium.

## 2014-09-07 NOTE — Telephone Encounter (Signed)
PA was approved. Pharmacy has been notified.

## 2014-09-07 NOTE — Telephone Encounter (Signed)
PA submitted ref# [id number]

## 2014-10-04 ENCOUNTER — Other Ambulatory Visit: Payer: Self-pay | Admitting: Family Medicine

## 2014-12-04 ENCOUNTER — Other Ambulatory Visit: Payer: Self-pay | Admitting: Family Medicine

## 2014-12-05 NOTE — Telephone Encounter (Signed)
Can we refill this? 

## 2014-12-07 ENCOUNTER — Other Ambulatory Visit: Payer: Self-pay | Admitting: Family Medicine

## 2014-12-15 ENCOUNTER — Other Ambulatory Visit: Payer: Self-pay | Admitting: Family Medicine

## 2014-12-16 NOTE — Telephone Encounter (Signed)
Call in Xanax #[phone] with 5 rf, also refill Celebrex for one year

## 2015-01-01 ENCOUNTER — Other Ambulatory Visit: Payer: Self-pay | Admitting: Family Medicine

## 2015-01-03 ENCOUNTER — Other Ambulatory Visit: Payer: Self-pay | Admitting: Family Medicine

## 2015-01-09 ENCOUNTER — Other Ambulatory Visit: Payer: Self-pay

## 2015-01-09 ENCOUNTER — Other Ambulatory Visit: Payer: Self-pay | Admitting: Family Medicine

## 2015-01-09 DIAGNOSIS — Z853 Personal history of malignant neoplasm of breast: Secondary | ICD-10-CM

## 2015-02-21 ENCOUNTER — Encounter: Payer: Medicare HMO | Admitting: Family Medicine

## 2015-02-28 NOTE — Assessment & Plan Note (Signed)
status post left mastectomy with axillary lymph node dissection November 2011 and triple negative disease with Ki-67 13% followed by immediate reconstruction status post adjuvant chemotherapy with a.c. followed by Taxol carbo develop neuropathy and myelosuppression  Surveillance:  1. Breast exam 03/01/2015 is normal 2. Mammogram to be done 03/03/2015 is normal  Survivorship:Discussed the importance of physical exercise in decreasing the likelihood of breast cancer recurrence. Recommended 30 mins daily 6 days a week of either brisk walking or cycling or swimming. Encouraged patient to eat more fruits and vegetables and decrease red meat.

## 2015-03-01 ENCOUNTER — Other Ambulatory Visit (HOSPITAL_BASED_OUTPATIENT_CLINIC_OR_DEPARTMENT_OTHER): Payer: Medicare HMO

## 2015-03-01 ENCOUNTER — Telehealth: Payer: Self-pay | Admitting: Hematology and Oncology

## 2015-03-01 ENCOUNTER — Encounter: Payer: Self-pay | Admitting: Hematology and Oncology

## 2015-03-01 ENCOUNTER — Ambulatory Visit (HOSPITAL_BASED_OUTPATIENT_CLINIC_OR_DEPARTMENT_OTHER): Payer: Medicare HMO | Admitting: Hematology and Oncology

## 2015-03-01 VITALS — BP 144/83 | HR 76 | Temp 98.8°F | Resp 18 | Ht 66.0 in | Wt 215.6 lb

## 2015-03-01 DIAGNOSIS — Z853 Personal history of malignant neoplasm of breast: Secondary | ICD-10-CM

## 2015-03-01 DIAGNOSIS — C50212 Malignant neoplasm of upper-inner quadrant of left female breast: Secondary | ICD-10-CM

## 2015-03-01 LAB — CBC WITH DIFFERENTIAL/PLATELET
BASO%: 0.9 % (ref 0.0–2.0)
BASOS ABS: 0.1 10*3/uL (ref 0.0–0.1)
EOS%: 3.9 % (ref 0.0–7.0)
Eosinophils Absolute: 0.2 10*3/uL (ref 0.0–0.5)
HCT: 36.8 % (ref 34.8–46.6)
HGB: 12.1 g/dL (ref 11.6–15.9)
LYMPH%: 19.5 % (ref 14.0–49.7)
MCH: 27.7 pg (ref 25.1–34.0)
MCHC: 32.8 g/dL (ref 31.5–36.0)
MCV: 84.4 fL (ref 79.5–101.0)
MONO#: 0.3 10*3/uL (ref 0.1–0.9)
MONO%: 6 % (ref 0.0–14.0)
NEUT#: 4 10*3/uL (ref 1.5–6.5)
NEUT%: 69.7 % (ref 38.4–76.8)
Platelets: 183 10*3/uL (ref 145–400)
RBC: 4.36 10*6/uL (ref 3.70–5.45)
RDW: 14 % (ref 11.2–14.5)
WBC: 5.7 10*3/uL (ref 3.9–10.3)
lymph#: 1.1 10*3/uL (ref 0.9–3.3)

## 2015-03-01 LAB — COMPREHENSIVE METABOLIC PANEL (CC13)
ALT: 20 U/L (ref 0–55)
AST: 21 U/L (ref 5–34)
Albumin: 3.8 g/dL (ref 3.5–5.0)
Alkaline Phosphatase: 112 U/L (ref 40–150)
Anion Gap: 10 mEq/L (ref 3–11)
BUN: 24.3 mg/dL (ref 7.0–26.0)
CO2: 27 meq/L (ref 22–29)
Calcium: 9.1 mg/dL (ref 8.4–10.4)
Chloride: 105 mEq/L (ref 98–109)
Creatinine: 1 mg/dL (ref 0.6–1.1)
EGFR: 59 mL/min/{1.73_m2} — AB (ref >90)
GLUCOSE: 119 mg/dL (ref 70–140)
POTASSIUM: 3.5 meq/L (ref 3.5–5.1)
SODIUM: 141 meq/L (ref 136–145)
Total Bilirubin: 0.39 mg/dL (ref 0.20–1.20)
Total Protein: 6.8 g/dL (ref 6.4–8.3)

## 2015-03-01 NOTE — Progress Notes (Signed)
Patient Care Team: Laurey Morale, MD as PCP - General  DIAGNOSIS: No matching staging information was found for the patient.  SUMMARY OF ONCOLOGIC HISTORY:   Breast cancer of upper-inner quadrant of left female breast ([place])   03/30/2010 Surgery Left Mastectomy: Multifocal LN Negative, ER/PR Neg, Her 2 Neg, Ki 67: 18%, Lef Breast reconstruction   04/25/2010 - 09/20/2010 Chemotherapy AC X 4, Taxol carbo X 7, Taxol X 5    CHIEF COMPLIANT: Follow-up of breast cancer  INTERVAL HISTORY: Rebekah Moreno is a 69 year old with above-mentioned history of left breast cancer currently on surveillance. She is here for a six-month follow-up. She reports to be doing quite well. Denies any lumps or nodules in the breasts. She is scheduled to undergo mammograms very shortly. She does complain of feeling fatigued very easily.  REVIEW OF SYSTEMS:   Constitutional: Denies fevers, chills or abnormal weight loss Eyes: Denies blurriness of vision Ears, nose, mouth, throat, and face: Denies mucositis or sore throat Respiratory: Denies cough, dyspnea or wheezes Cardiovascular: Denies palpitation, chest discomfort or lower extremity swelling Gastrointestinal:  Denies nausea, heartburn or change in bowel habits Skin: Denies abnormal skin rashes Lymphatics: Denies new lymphadenopathy or easy bruising Neurological:Denies numbness, tingling or new weaknesses Behavioral/Psych: Mood is stable, no new changes  Breast:  denies any pain or lumps or nodules in the right breast, left mastectomy All other systems were reviewed with the patient and are negative.  I have reviewed the past medical history, past surgical history, social history and family history with the patient and they are unchanged from previous note.  ALLERGIES:  is allergic to dust mite extract; mold extract; other; and succinylcholine.  MEDICATIONS:  Current Outpatient Prescriptions  Medication Sig Dispense Refill  . acyclovir (ZOVIRAX) 400 MG  tablet TAKE 1 TABLET BY MOUTH EVERY 8 HOURS AS NEEDED FOR FEVER BLISTERS 60 tablet 3  . albuterol (PROVENTIL HFA;VENTOLIN HFA) 108 (90 BASE) MCG/ACT inhaler Inhale 2 puffs into the lungs every 4 (four) hours as needed for wheezing or shortness of breath. 1 Inhaler 3  . ALPRAZolam (XANAX) 0.5 MG tablet TAKE 1 TABLET BY MOUTH 3 TIMES A DAY AS NEEDED FOR ANXIETY 90 tablet 5  . celecoxib (CELEBREX) 200 MG capsule TAKE 1 CAPSULE (200 MG TOTAL) BY MOUTH 2 (TWO) TIMES DAILY. 180 capsule 3  . cyclobenzaprine (FLEXERIL) 10 MG tablet TAKE 1 TABLET BY MOUTH 3 TIMES A DAY AS NEEDED FOR MUSCLE SPASMS (Patient not taking: Reported on 05/10/2014) 60 tablet 3  . enalapril-hydrochlorothiazide (VASERETIC) 10-25 MG per tablet TAKE 1 TABLET BY MOUTH DAILY. 90 tablet 0  . HYDROcodone-acetaminophen (NORCO) 10-325 MG per tablet Take 1 tablet by mouth every 6 (six) hours as needed for pain. (Patient not taking: Reported on 05/10/2014) 60 tablet 0  . HYDROcodone-homatropine (HYDROMET) 5-1.5 MG/5ML syrup Take 5 mLs by mouth every 4 (four) hours as needed. (Patient not taking: Reported on 06/06/2014) 240 mL 0  . KLOR-CON 10 10 MEQ tablet TAKE 1 TABLET (10 MEQ TOTAL) BY MOUTH DAILY. 90 tablet 3  . metoprolol succinate (TOPROL-XL) 25 MG 24 hr tablet Take 1 tablet (25 mg total) by mouth daily. 90 tablet 3  . Multiple Vitamins-Minerals (CENTRUM ULTRA WOMENS) TABS Take 1 tablet by mouth daily.      Marland Kitchen NEXIUM 40 MG capsule TAKE ONE CAPSULE BY MOUTH EVERY DAY 90 capsule 1  . sertraline (ZOLOFT) 100 MG tablet TAKE 1 TABLET BY MOUTH DAILY. 90 tablet 3  . simvastatin (ZOCOR) 40  MG tablet TAKE 1 TABLET BY MOUTH EVERY EVENING 90 tablet 0   No current facility-administered medications for this visit.    PHYSICAL EXAMINATION: ECOG PERFORMANCE STATUS: 0 - Asymptomatic  Filed Vitals:   03/01/15 1557  BP: 144/83  Pulse: 76  Temp: 98.8 F (37.1 C)  Resp: 18   Filed Weights   03/01/15 1557  Weight: 215 lb 9.6 oz (97.796 kg)     GENERAL:alert, no distress and comfortable SKIN: skin color, texture, turgor are normal, no rashes or significant lesions EYES: normal, Conjunctiva are pink and non-injected, sclera clear OROPHARYNX:no exudate, no erythema and lips, buccal mucosa, and tongue normal  NECK: supple, thyroid normal size, non-tender, without nodularity LYMPH:  no palpable lymphadenopathy in the cervical, axillary or inguinal LUNGS: clear to auscultation and percussion with normal breathing effort HEART: regular rate & rhythm and no murmurs and no lower extremity edema ABDOMEN:abdomen soft, non-tender and normal bowel sounds Musculoskeletal:no cyanosis of digits and no clubbing  NEURO: alert & oriented x 3 with fluent speech, no focal motor/sensory deficits BREAST: Left mastectomy. (exam performed in the presence of a chaperone)  LABORATORY DATA:  I have reviewed the data as listed   Chemistry      Component Value Date/Time   NA 141 03/01/2015 1529   NA 141 11/30/2013 0821   K 3.5 03/01/2015 1529   K 3.8 11/30/2013 0821   CL 103 11/30/2013 0821   CL 104 09/09/2012 0935   CO2 27 03/01/2015 1529   CO2 29 11/30/2013 0821   BUN 24.3 03/01/2015 1529   BUN 20 11/30/2013 0821   CREATININE 1.0 03/01/2015 1529   CREATININE 0.8 11/30/2013 0821      Component Value Date/Time   CALCIUM 9.1 03/01/2015 1529   CALCIUM 9.3 11/30/2013 0821   ALKPHOS 112 03/01/2015 1529   ALKPHOS 77 11/30/2013 0821   AST 21 03/01/2015 1529   AST 21 11/30/2013 0821   ALT 20 03/01/2015 1529   ALT 20 11/30/2013 0821   BILITOT 0.39 03/01/2015 1529   BILITOT 0.7 11/30/2013 0821       Lab Results  Component Value Date   WBC 5.7 03/01/2015   HGB 12.1 03/01/2015   HCT 36.8 03/01/2015   MCV 84.4 03/01/2015   PLT 183 03/01/2015   NEUTROABS 4.0 03/01/2015   ASSESSMENT & PLAN:  Breast cancer of upper-inner quadrant of left female breast status post left mastectomy with axillary lymph node dissection November 2011 and  triple negative disease with Ki-67 13% followed by immediate reconstruction status post adjuvant chemotherapy with AC followed by Taxol carbo develop neuropathy and myelosuppression  Surveillance:  1. Breast exam 03/01/2015 is normal 2. Mammogram to be done 03/03/2015  Survivorship:Discussed the importance of physical exercise in decreasing the likelihood of breast cancer recurrence. Recommended 30 mins daily 6 days a week of either brisk walking or cycling or swimming. Encouraged patient to eat more fruits and vegetables and decrease red meat.   Since she completed 5 years of therapy, I recommended follow-up with survivorship NP annually. But she wanted to follow with me for a few more years annually. I reviewed her blood work which was normal.  Orders Placed This Encounter  Procedures  . CBC with Differential    Standing Status: Future     Number of Occurrences:      Standing Expiration Date: 02/29/2016  . Comprehensive metabolic panel (Cmet) - [facility]    Standing Status: Future     Number of Occurrences:        Standing Expiration Date: 02/29/2016   The patient has a good understanding of the overall plan. she agrees with it. she will call with any problems that may develop before the next visit here.   Rulon Eisenmenger, MD 03/01/2015

## 2015-03-01 NOTE — Telephone Encounter (Signed)
Appointments made and avs pritned for pateint °

## 2015-03-02 ENCOUNTER — Encounter: Payer: Self-pay | Admitting: Family Medicine

## 2015-03-02 ENCOUNTER — Ambulatory Visit (INDEPENDENT_AMBULATORY_CARE_PROVIDER_SITE_OTHER): Payer: Medicare HMO | Admitting: Family Medicine

## 2015-03-02 VITALS — BP 151/98 | HR 92 | Temp 98.4°F | Ht 66.0 in | Wt 212.0 lb

## 2015-03-02 DIAGNOSIS — Z0000 Encounter for general adult medical examination without abnormal findings: Secondary | ICD-10-CM | POA: Diagnosis not present

## 2015-03-02 DIAGNOSIS — E785 Hyperlipidemia, unspecified: Secondary | ICD-10-CM | POA: Diagnosis not present

## 2015-03-02 DIAGNOSIS — J40 Bronchitis, not specified as acute or chronic: Secondary | ICD-10-CM | POA: Diagnosis not present

## 2015-03-02 LAB — LIPID PANEL
CHOL/HDL RATIO: 3
Cholesterol: 196 mg/dL (ref 0–200)
HDL: 59.1 mg/dL (ref >39.00)
LDL Cholesterol: 115 mg/dL — ABNORMAL HIGH (ref 0–99)
NONHDL: 137.25
Triglycerides: 113 mg/dL (ref 0.0–149.0)
VLDL: 22.6 mg/dL (ref 0.0–40.0)

## 2015-03-02 LAB — POCT URINALYSIS DIPSTICK
Glucose, UA: NEGATIVE
KETONES UA: NEGATIVE
Leukocytes, UA: NEGATIVE
Nitrite, UA: NEGATIVE
PH UA: 8
RBC UA: NEGATIVE
SPEC GRAV UA: 1.02
UROBILINOGEN UA: 1

## 2015-03-02 LAB — TSH: TSH: 1.8 u[IU]/mL (ref 0.35–4.50)

## 2015-03-02 MED ORDER — NEXIUM 40 MG PO CPDR: 40.0000 mg | DELAYED_RELEASE_CAPSULE | Freq: Every day | ORAL | Status: DC

## 2015-03-02 MED ORDER — HYDROCODONE-HOMATROPINE 5-1.5 MG/5ML PO SYRP: 5.0000 mL | ORAL_SOLUTION | ORAL | Status: DC | PRN

## 2015-03-02 MED ORDER — SIMVASTATIN 40 MG PO TABS: 40.0000 mg | ORAL_TABLET | Freq: Every evening | ORAL | Status: DC

## 2015-03-02 MED ORDER — ALBUTEROL SULFATE HFA 108 (90 BASE) MCG/ACT IN AERS: 2.0000 | INHALATION_SPRAY | RESPIRATORY_TRACT | Status: DC | PRN

## 2015-03-02 MED ORDER — AMOXICILLIN-POT CLAVULANATE 875-125 MG PO TABS
1.0000 | ORAL_TABLET | Freq: Two times a day (BID) | ORAL | Status: DC
Start: 2015-03-02 — End: 2015-04-18

## 2015-03-02 NOTE — Progress Notes (Signed)
   Subjective:    Patient ID: Rebekah Moreno, female    DOB: Sep 24, 1945, 69 y.o.   MRN: [id number]  HPI 69 yr old female for a cpx. She had been feeling well until 3 weeks ago when she developed chest tightness and coughing up green sputum. No fever.    Review of Systems  Constitutional: Negative.   HENT: Negative.   Eyes: Negative.   Respiratory: Negative.   Cardiovascular: Negative.   Gastrointestinal: Negative.   Genitourinary: Negative for dysuria, urgency, frequency, hematuria, flank pain, decreased urine volume, enuresis, difficulty urinating, pelvic pain and dyspareunia.  Musculoskeletal: Negative.   Skin: Negative.   Neurological: Negative.   Psychiatric/Behavioral: Negative.        Objective:   Physical Exam  Constitutional: She is oriented to person, place, and time. She appears well-developed and well-nourished. No distress.  HENT:  Head: Normocephalic and atraumatic.  Right Ear: External ear normal.  Left Ear: External ear normal.  Nose: Nose normal.  Mouth/Throat: Oropharynx is clear and moist. No oropharyngeal exudate.  Eyes: Conjunctivae and EOM are normal. Pupils are equal, round, and reactive to light. No scleral icterus.  Neck: Normal range of motion. Neck supple. No JVD present. No thyromegaly present.  Cardiovascular: Normal rate, regular rhythm, normal heart sounds and intact distal pulses.  Exam reveals no gallop and no friction rub.   No murmur heard. EKG normal   Pulmonary/Chest: Effort normal and breath sounds normal. No respiratory distress. She has no wheezes. She has no rales. She exhibits no tenderness.  Abdominal: Soft. Bowel sounds are normal. She exhibits no distension and no mass. There is no tenderness. There is no rebound and no guarding.  Musculoskeletal: Normal range of motion. She exhibits no edema or tenderness.  Lymphadenopathy:    She has no cervical adenopathy.  Neurological: She is alert and oriented to person, place, and time.  She has normal reflexes. No cranial nerve deficit. She exhibits normal muscle tone. Coordination normal.  Skin: Skin is warm and dry. No rash noted. No erythema.  Psychiatric: She has a normal mood and affect. Her behavior is normal. Judgment and thought content normal.          Assessment & Plan:  Well exam. Get fasting labs. We discussed diet and exercise advice. Treat the bronchitis with Augmentin.

## 2015-03-02 NOTE — Progress Notes (Signed)
Pre visit review using our clinic review tool, if applicable. No additional management support is needed unless otherwise documented below in the visit note. 

## 2015-03-03 ENCOUNTER — Ambulatory Visit
Admission: RE | Admit: 2015-03-03 | Discharge: 2015-03-03 | Disposition: A | Discharge status: Home / Self Care | Payer: Medicare HMO | Source: Ambulatory Visit

## 2015-03-03 DIAGNOSIS — Z853 Personal history of malignant neoplasm of breast: Secondary | ICD-10-CM

## 2015-03-03 DIAGNOSIS — Z1231 Encounter for screening mammogram for malignant neoplasm of breast: Secondary | ICD-10-CM | POA: Diagnosis not present

## 2015-03-08 DIAGNOSIS — M1711 Unilateral primary osteoarthritis, right knee: Secondary | ICD-10-CM | POA: Diagnosis not present

## 2015-03-15 DIAGNOSIS — M1711 Unilateral primary osteoarthritis, right knee: Secondary | ICD-10-CM | POA: Diagnosis not present

## 2015-03-22 DIAGNOSIS — M1711 Unilateral primary osteoarthritis, right knee: Secondary | ICD-10-CM | POA: Diagnosis not present

## 2015-03-24 DIAGNOSIS — Z13820 Encounter for screening for osteoporosis: Secondary | ICD-10-CM | POA: Diagnosis not present

## 2015-03-24 DIAGNOSIS — Z6834 Body mass index (BMI) 34.0-34.9, adult: Secondary | ICD-10-CM | POA: Diagnosis not present

## 2015-03-24 DIAGNOSIS — Z01419 Encounter for gynecological examination (general) (routine) without abnormal findings: Secondary | ICD-10-CM | POA: Diagnosis not present

## 2015-03-24 DIAGNOSIS — M816 Localized osteoporosis [Lequesne]: Secondary | ICD-10-CM | POA: Diagnosis not present

## 2015-03-24 DIAGNOSIS — Z124 Encounter for screening for malignant neoplasm of cervix: Secondary | ICD-10-CM | POA: Diagnosis not present

## 2015-03-24 DIAGNOSIS — N958 Other specified menopausal and perimenopausal disorders: Secondary | ICD-10-CM | POA: Diagnosis not present

## 2015-03-31 ENCOUNTER — Other Ambulatory Visit: Payer: Self-pay | Admitting: Family Medicine

## 2015-04-03 DIAGNOSIS — M818 Other osteoporosis without current pathological fracture: Secondary | ICD-10-CM | POA: Diagnosis not present

## 2015-04-04 ENCOUNTER — Other Ambulatory Visit: Payer: Self-pay | Admitting: Family Medicine

## 2015-04-04 ENCOUNTER — Telehealth: Payer: Self-pay | Admitting: Family Medicine

## 2015-04-04 NOTE — Telephone Encounter (Signed)
Pt needs refill on generic vaseretic 10-25 mg #90 w/refills send to [facility]

## 2015-04-05 MED ORDER — ENALAPRIL-HYDROCHLOROTHIAZIDE 10-25 MG PO TABS: 1.0000 | ORAL_TABLET | Freq: Every day | ORAL | Status: DC

## 2015-04-05 NOTE — Telephone Encounter (Signed)
I sent script e-scribe. 

## 2015-04-18 ENCOUNTER — Telehealth: Payer: Self-pay | Admitting: Family Medicine

## 2015-04-18 MED ORDER — AMOXICILLIN-POT CLAVULANATE 875-125 MG PO TABS: 1.0000 | ORAL_TABLET | Freq: Two times a day (BID) | ORAL | Status: DC

## 2015-04-18 NOTE — Telephone Encounter (Signed)
I sent script e-scribe and spoke with pt. 

## 2015-04-18 NOTE — Telephone Encounter (Signed)
Call in Augmentin bid for 10 days

## 2015-04-18 NOTE — Telephone Encounter (Signed)
Pt called back wondering if the Augmentin can be sent to her pharmacy before she leaves out of town to go visit her newborn grand-daughter. She has to leave around 9am in the morning. Please give her a phone call.   Pt ph# [phone] Thank you.

## 2015-04-18 NOTE — Telephone Encounter (Signed)
Patient saw Dr. Sarajane Jews on 03/02/2015 for sinus infection and patient is requesting a refill on amoxicillin-clavulanate (AUGMENTIN) 875-125 MG tablet.Patient states that she help taking care of newborn and it would be hard for her to get in.   Pharmacy: [facility]

## 2015-04-25 ENCOUNTER — Other Ambulatory Visit: Payer: Self-pay | Admitting: Obstetrics and Gynecology

## 2015-04-25 DIAGNOSIS — Z853 Personal history of malignant neoplasm of breast: Secondary | ICD-10-CM

## 2015-04-28 DIAGNOSIS — M1711 Unilateral primary osteoarthritis, right knee: Secondary | ICD-10-CM | POA: Diagnosis not present

## 2015-04-30 ENCOUNTER — Other Ambulatory Visit: Payer: Self-pay | Admitting: Family Medicine

## 2015-05-04 ENCOUNTER — Other Ambulatory Visit: Payer: Medicare HMO

## 2015-05-12 ENCOUNTER — Other Ambulatory Visit: Payer: Medicare HMO

## 2015-05-21 HISTORY — PX: CATARACT EXTRACTION W/ INTRAOCULAR LENS  IMPLANT, BILATERAL: SHX1307

## 2015-05-24 DIAGNOSIS — H02831 Dermatochalasis of right upper eyelid: Secondary | ICD-10-CM | POA: Diagnosis not present

## 2015-05-24 DIAGNOSIS — H25813 Combined forms of age-related cataract, bilateral: Secondary | ICD-10-CM | POA: Diagnosis not present

## 2015-05-24 DIAGNOSIS — H527 Unspecified disorder of refraction: Secondary | ICD-10-CM | POA: Diagnosis not present

## 2015-05-24 DIAGNOSIS — H02834 Dermatochalasis of left upper eyelid: Secondary | ICD-10-CM | POA: Diagnosis not present

## 2015-06-02 DIAGNOSIS — H527 Unspecified disorder of refraction: Secondary | ICD-10-CM | POA: Diagnosis not present

## 2015-06-02 DIAGNOSIS — H52223 Regular astigmatism, bilateral: Secondary | ICD-10-CM | POA: Diagnosis not present

## 2015-06-02 DIAGNOSIS — H02834 Dermatochalasis of left upper eyelid: Secondary | ICD-10-CM | POA: Diagnosis not present

## 2015-06-02 DIAGNOSIS — H25813 Combined forms of age-related cataract, bilateral: Secondary | ICD-10-CM | POA: Diagnosis not present

## 2015-06-02 DIAGNOSIS — H02831 Dermatochalasis of right upper eyelid: Secondary | ICD-10-CM | POA: Diagnosis not present

## 2015-06-08 DIAGNOSIS — I10 Essential (primary) hypertension: Secondary | ICD-10-CM | POA: Diagnosis not present

## 2015-06-08 DIAGNOSIS — H25812 Combined forms of age-related cataract, left eye: Secondary | ICD-10-CM | POA: Diagnosis not present

## 2015-06-08 DIAGNOSIS — H2513 Age-related nuclear cataract, bilateral: Secondary | ICD-10-CM | POA: Diagnosis not present

## 2015-06-08 DIAGNOSIS — H02831 Dermatochalasis of right upper eyelid: Secondary | ICD-10-CM | POA: Diagnosis not present

## 2015-06-08 DIAGNOSIS — H25011 Cortical age-related cataract, right eye: Secondary | ICD-10-CM | POA: Diagnosis not present

## 2015-06-08 DIAGNOSIS — H02834 Dermatochalasis of left upper eyelid: Secondary | ICD-10-CM | POA: Diagnosis not present

## 2015-06-08 DIAGNOSIS — H25019 Cortical age-related cataract, unspecified eye: Secondary | ICD-10-CM | POA: Diagnosis not present

## 2015-06-08 DIAGNOSIS — E785 Hyperlipidemia, unspecified: Secondary | ICD-10-CM | POA: Diagnosis not present

## 2015-06-08 DIAGNOSIS — R69 Illness, unspecified: Secondary | ICD-10-CM | POA: Diagnosis not present

## 2015-06-08 DIAGNOSIS — H52223 Regular astigmatism, bilateral: Secondary | ICD-10-CM | POA: Diagnosis not present

## 2015-06-08 DIAGNOSIS — H527 Unspecified disorder of refraction: Secondary | ICD-10-CM | POA: Diagnosis not present

## 2015-06-08 DIAGNOSIS — K219 Gastro-esophageal reflux disease without esophagitis: Secondary | ICD-10-CM | POA: Diagnosis not present

## 2015-06-08 DIAGNOSIS — Z135 Encounter for screening for eye and ear disorders: Secondary | ICD-10-CM | POA: Diagnosis not present

## 2015-06-08 DIAGNOSIS — E669 Obesity, unspecified: Secondary | ICD-10-CM | POA: Diagnosis not present

## 2015-06-20 ENCOUNTER — Other Ambulatory Visit: Payer: Self-pay | Admitting: Family Medicine

## 2015-06-20 NOTE — Telephone Encounter (Signed)
Call in #[phone] with 5 rf 

## 2015-06-23 DIAGNOSIS — L821 Other seborrheic keratosis: Secondary | ICD-10-CM | POA: Diagnosis not present

## 2015-06-23 DIAGNOSIS — D1801 Hemangioma of skin and subcutaneous tissue: Secondary | ICD-10-CM | POA: Diagnosis not present

## 2015-06-23 DIAGNOSIS — Z85828 Personal history of other malignant neoplasm of skin: Secondary | ICD-10-CM | POA: Diagnosis not present

## 2015-06-23 DIAGNOSIS — L820 Inflamed seborrheic keratosis: Secondary | ICD-10-CM | POA: Diagnosis not present

## 2015-06-23 DIAGNOSIS — D2272 Melanocytic nevi of left lower limb, including hip: Secondary | ICD-10-CM | POA: Diagnosis not present

## 2015-06-23 DIAGNOSIS — D225 Melanocytic nevi of trunk: Secondary | ICD-10-CM | POA: Diagnosis not present

## 2015-06-23 DIAGNOSIS — L812 Freckles: Secondary | ICD-10-CM | POA: Diagnosis not present

## 2015-06-23 DIAGNOSIS — D2271 Melanocytic nevi of right lower limb, including hip: Secondary | ICD-10-CM | POA: Diagnosis not present

## 2015-07-06 DIAGNOSIS — E7800 Pure hypercholesterolemia, unspecified: Secondary | ICD-10-CM | POA: Diagnosis not present

## 2015-07-06 DIAGNOSIS — E876 Hypokalemia: Secondary | ICD-10-CM | POA: Diagnosis not present

## 2015-07-06 DIAGNOSIS — H527 Unspecified disorder of refraction: Secondary | ICD-10-CM | POA: Diagnosis not present

## 2015-07-06 DIAGNOSIS — H52221 Regular astigmatism, right eye: Secondary | ICD-10-CM | POA: Diagnosis not present

## 2015-07-06 DIAGNOSIS — H02831 Dermatochalasis of right upper eyelid: Secondary | ICD-10-CM | POA: Diagnosis not present

## 2015-07-06 DIAGNOSIS — I10 Essential (primary) hypertension: Secondary | ICD-10-CM | POA: Diagnosis not present

## 2015-07-06 DIAGNOSIS — H25811 Combined forms of age-related cataract, right eye: Secondary | ICD-10-CM | POA: Diagnosis not present

## 2015-07-06 DIAGNOSIS — R69 Illness, unspecified: Secondary | ICD-10-CM | POA: Diagnosis not present

## 2015-07-06 DIAGNOSIS — Z853 Personal history of malignant neoplasm of breast: Secondary | ICD-10-CM | POA: Diagnosis not present

## 2015-07-06 DIAGNOSIS — H02834 Dermatochalasis of left upper eyelid: Secondary | ICD-10-CM | POA: Diagnosis not present

## 2015-07-27 ENCOUNTER — Encounter: Payer: Self-pay | Admitting: Family Medicine

## 2015-07-27 ENCOUNTER — Ambulatory Visit (INDEPENDENT_AMBULATORY_CARE_PROVIDER_SITE_OTHER): Payer: Medicare HMO | Admitting: Family Medicine

## 2015-07-27 VITALS — BP 144/82 | HR 76 | Temp 98.5°F | Ht 66.0 in | Wt 215.2 lb

## 2015-07-27 DIAGNOSIS — J4521 Mild intermittent asthma with (acute) exacerbation: Secondary | ICD-10-CM

## 2015-07-27 DIAGNOSIS — J0101 Acute recurrent maxillary sinusitis: Secondary | ICD-10-CM | POA: Diagnosis not present

## 2015-07-27 MED ORDER — PREDNISONE 20 MG PO TABS: 20.0000 mg | ORAL_TABLET | Freq: Every day | ORAL | Status: DC

## 2015-07-27 MED ORDER — AMOXICILLIN-POT CLAVULANATE 875-125 MG PO TABS: 1.0000 | ORAL_TABLET | Freq: Two times a day (BID) | ORAL | Status: DC

## 2015-07-27 NOTE — Progress Notes (Signed)
HPI:  Rebekah Moreno is a pleasant 70 yo with a PMH significant fro Asthma, Allergies and sinusitis here for an acute visit for:  "Sinus infection" -started: 1.5 weeks ago, worsening -symptoms:nasal congestion, sore throat, cough, occ wheezing, increased albuterol use, now with R maxillary sinus discomfort and thick yellow nasal congestion -denies:fever, SOB, NVD, tooth pain -has tried:  alb -sick contacts/travel/risks: no reported flu, strep or tick exposure  ROS: See pertinent positives and negatives per HPI.  Past Medical History  Diagnosis Date  . Hypertension   . Hyperlipidemia   . GERD (gastroesophageal reflux disease)   . Asthma   . Arthritis   . Colon polyps   . Compression fx, lumbar spine ([place])     l2  . Osteoporosis   . Hematuria   . Anxiety   . Peptic stricture of esophagus   . Hemorrhoids   . Cancer [facility])     Breast, sees Dr. Frutoso Chase   . Allergy     Past Surgical History  Procedure Laterality Date  . Knee arthroscopy      left  . Joint replacement      left knee  . Left knee arthroscopy   02/2001  . Total knee arthroplasty  left 12/2--3    dr s Pincus Badder dean  . L3,l4 discectomy  11/2000    per Dr. Hal Neer   . Rhinoplasty      dr Janace Hoard  . Hemorrhoid surgery  1980  . Mastectomy  11/11    left breast  . Portacath placement  2011  . Colonoscopy  01-31-14    per Dr. Fuller Plan, adenomatous polyps, repeat in 5 yrs   . Port-a-cath removal  2012  . Breast surgery  2011    left mastectomy and reconstruction  . Femur im nail Right 07/04/2012    Procedure: INTRAMEDULLARY (IM) RETROGRADE FEMORAL NAILING;  Surgeon: Hessie Dibble, MD;  Location: [place];  Service: Orthopedics;  Laterality: Right;    Family History  Problem Relation Age of Onset  . Arthritis    . Hypertension    . Cancer    . Stroke    . Heart disease    . Heart disease Paternal Grandmother   . Heart failure Mother   . Colon cancer Neg Hx     Social History   Social History  .  Marital Status: Widowed    Spouse Name: N/A  . Number of Children: N/A  . Years of Education: N/A   Social History Main Topics  . Smoking status: Never Smoker   . Smokeless tobacco: Never Used  . Alcohol Use: No     Comment: rare  . Drug Use: No  . Sexual Activity: Yes    Birth Control/ Protection: Post-menopausal   Other Topics Concern  . None   Social History Narrative     Current outpatient prescriptions:  .  acyclovir (ZOVIRAX) 400 MG tablet, TAKE 1 TABLET BY MOUTH EVERY 8 HOURS AS NEEDED FOR FEVER BLISTERS, Disp: 60 tablet, Rfl: 3 .  albuterol (PROVENTIL HFA;VENTOLIN HFA) 108 (90 BASE) MCG/ACT inhaler, Inhale 2 puffs into the lungs every 4 (four) hours as needed for wheezing or shortness of breath., Disp: 1 Inhaler, Rfl: 11 .  ALPRAZolam (XANAX) 0.5 MG tablet, TAKE ONE TABLET BY MOUTH THREE TIMES A DAY AS NEEDED, Disp: 90 tablet, Rfl: 5 .  celecoxib (CELEBREX) 200 MG capsule, TAKE 1 CAPSULE (200 MG TOTAL) BY MOUTH 2 (TWO) TIMES DAILY., Disp: 180 capsule,  Rfl: 3 .  enalapril-hydrochlorothiazide (VASERETIC) 10-25 MG tablet, Take 1 tablet by mouth daily., Disp: 90 tablet, Rfl: 3 .  KLOR-CON 10 10 MEQ tablet, TAKE 1 TABLET (10 MEQ TOTAL) BY MOUTH DAILY., Disp: 90 tablet, Rfl: 3 .  metoprolol succinate (TOPROL-XL) 25 MG 24 hr tablet, TAKE 1 TABLET BY MOUTH DAILY, Disp: 90 tablet, Rfl: 3 .  Multiple Vitamins-Minerals (CENTRUM ULTRA WOMENS) TABS, Take 1 tablet by mouth daily.  , Disp: , Rfl:  .  NEXIUM 40 MG capsule, Take 1 capsule (40 mg total) by mouth daily., Disp: 90 capsule, Rfl: 3 .  sertraline (ZOLOFT) 100 MG tablet, TAKE 1 TABLET BY MOUTH DAILY., Disp: 90 tablet, Rfl: 3 .  simvastatin (ZOCOR) 40 MG tablet, Take 1 tablet (40 mg total) by mouth every evening., Disp: 90 tablet, Rfl: 3 .  amoxicillin-clavulanate (AUGMENTIN) 875-125 MG tablet, Take 1 tablet by mouth 2 (two) times daily., Disp: 20 tablet, Rfl: 0 .  predniSONE (DELTASONE) 20 MG tablet, Take 1 tablet (20 mg total) by  mouth daily with breakfast., Disp: 8 tablet, Rfl: 0  EXAM:  Filed Vitals:   07/27/15 1532  BP: 144/82  Pulse: 76  Temp: 98.5 F (36.9 C)    Body mass index is 34.75 kg/(m^2).  GENERAL: vitals reviewed and listed above, alert, oriented, appears well hydrated and in no acute distress  HEENT: atraumatic, conjunttiva clear, no obvious abnormalities on inspection of external nose and ears, normal appearance of ear canals and TMs, thick nasal congestion, mild post oropharyngeal erythema with PND, no tonsillar edema or exudate, no sinus TTP  NECK: no obvious masses on inspection  LUNGS: clear to auscultation bilaterally, no wheezes, rales or rhonchi, good air movement  CV: HRRR, no peripheral edema  MS: moves all extremities without noticeable abnormality  PSYCH: pleasant and cooperative, no obvious depression or anxiety  ASSESSMENT AND PLAN:  Discussed the following assessment and plan:  Acute recurrent maxillary sinusitis  Asthma, mild intermittent, with acute exacerbation  -tx with augmentin and prednisone. We discussed treatment side effects, likely course and signs of developing a worsening or serious illness. -of course, we advised to return or notify a doctor immediately if symptoms worsen or persist or new concerns arise.    Patient Instructions  Please take the Augmentin (antibiotic) and the prednisone as instructed.  Please follow up with your doctor if any worsening, new symptoms or symptoms persist.      Colin Benton R.

## 2015-07-27 NOTE — Patient Instructions (Addendum)
Blood pressure check before leaving. Follow up with PCP in 2-4 weeks if remains elevated.  Please take the Augmentin (antibiotic) and the prednisone as instructed.  Please follow up with your doctor if any worsening, new symptoms or symptoms persist.

## 2015-07-27 NOTE — Progress Notes (Signed)
Pre visit review using our clinic review tool, if applicable. No additional management support is needed unless otherwise documented below in the visit note. 

## 2015-11-18 ENCOUNTER — Other Ambulatory Visit: Payer: Self-pay | Admitting: Family Medicine

## 2015-11-20 NOTE — Telephone Encounter (Signed)
Refill sent to pharmacy.   

## 2015-11-27 DIAGNOSIS — H00012 Hordeolum externum right lower eyelid: Secondary | ICD-10-CM | POA: Diagnosis not present

## 2015-11-27 DIAGNOSIS — H1131 Conjunctival hemorrhage, right eye: Secondary | ICD-10-CM | POA: Diagnosis not present

## 2015-11-29 ENCOUNTER — Other Ambulatory Visit: Payer: Self-pay | Admitting: Hematology and Oncology

## 2015-11-29 DIAGNOSIS — Z1231 Encounter for screening mammogram for malignant neoplasm of breast: Secondary | ICD-10-CM

## 2015-12-13 ENCOUNTER — Other Ambulatory Visit: Payer: Self-pay | Admitting: Family Medicine

## 2015-12-13 NOTE — Telephone Encounter (Signed)
Call in #[phone] with 5 rf 

## 2015-12-14 MED ORDER — ALPRAZOLAM 0.5 MG PO TABS: 0.5000 mg | ORAL_TABLET | Freq: Three times a day (TID) | ORAL | 5 refills | Status: DC | PRN

## 2015-12-14 NOTE — Telephone Encounter (Signed)
I called in script 

## 2015-12-21 ENCOUNTER — Other Ambulatory Visit: Payer: Self-pay | Admitting: Family Medicine

## 2016-01-11 ENCOUNTER — Telehealth: Payer: Self-pay

## 2016-01-11 NOTE — Telephone Encounter (Signed)
Patient is on the list for [employer] 2017 and may be a good candidate for an AWV in 2017. Please let me know if/when appt is scheduled.   

## 2016-02-21 ENCOUNTER — Ambulatory Visit (INDEPENDENT_AMBULATORY_CARE_PROVIDER_SITE_OTHER): Payer: Medicare HMO | Admitting: Family Medicine

## 2016-02-21 VITALS — BP 150/90 | HR 77 | Temp 98.3°F | Ht 66.0 in | Wt 210.7 lb

## 2016-02-21 DIAGNOSIS — J0100 Acute maxillary sinusitis, unspecified: Secondary | ICD-10-CM

## 2016-02-21 MED ORDER — AMOXICILLIN-POT CLAVULANATE 875-125 MG PO TABS: 1.0000 | ORAL_TABLET | Freq: Two times a day (BID) | ORAL | 0 refills | Status: DC

## 2016-02-21 NOTE — Progress Notes (Signed)
Subjective:     Patient ID: Rebekah Moreno, female   DOB: 10-08-1945, 70 y.o.   MRN: [id number]  HPI Patient seen with concern for acute sinusitis. Over 2 week history of frontal and mostly maxillary facial pain. She's had intermittent headaches. Increased fatigue. Greenish and bloody nasal discharge. Mild hoarseness. Mild sore throat. She's tried over-the-counter decongestants without relief. Minimal cough.  Past Medical History:  Diagnosis Date  . Allergy   . Anxiety   . Arthritis   . Asthma   . Cancer [facility])    Breast, sees Dr. Frutoso Chase   . Colon polyps   . Compression fx, lumbar spine ([place])    l2  . GERD (gastroesophageal reflux disease)   . Hematuria   . Hemorrhoids   . Hyperlipidemia   . Hypertension   . Osteoporosis   . Peptic stricture of esophagus    Past Surgical History:  Procedure Laterality Date  . BREAST SURGERY  2011   left mastectomy and reconstruction  . COLONOSCOPY  01-31-14   per Dr. Fuller Plan, adenomatous polyps, repeat in 5 yrs   . FEMUR IM NAIL Right 07/04/2012   Procedure: INTRAMEDULLARY (IM) RETROGRADE FEMORAL NAILING;  Surgeon: Hessie Dibble, MD;  Location: [place];  Service: Orthopedics;  Laterality: Right;  . [place]  . JOINT REPLACEMENT     left knee  . KNEE ARTHROSCOPY     left  . l3,l4 discectomy  11/2000   per Dr. Hal Neer   . left knee arthroscopy   02/2001  . MASTECTOMY  11/11   left breast  . PORT-A-CATH REMOVAL  2012  . PORTACATH PLACEMENT  2011  . RHINOPLASTY     dr Janace Hoard  . TOTAL KNEE ARTHROPLASTY  left 12/2--3   dr s Pincus Badder dean    reports that she has never smoked. She has never used smokeless tobacco. She reports that she does not drink alcohol or use drugs. family history includes Heart disease in her paternal grandmother; Heart failure in her mother. Allergies  Allergen Reactions  . Dust Mite Extract Other (See Comments)    Wheezing  . Mold Extract [Trichophyton Mentagrophyte] Other (See Comments)   Wheezing  . Other     Animal dandruff  . Succinylcholine     Muscle weakness 24 hours after surgery     Review of Systems  Constitutional: Positive for fatigue.  HENT: Positive for congestion, sinus pressure and sore throat.   Cardiovascular: Negative for chest pain.  Neurological: Positive for headaches.       Objective:   Physical Exam  Constitutional: She appears well-developed and well-nourished.  HENT:  Right Ear: External ear normal.  Left Ear: External ear normal.  Mouth/Throat: Oropharynx is clear and moist. No oropharyngeal exudate.  Small amount of yellow crusted drainage bilaterally which is blood-tinged  Neck: Neck supple.  Cardiovascular: Normal rate.   Pulmonary/Chest: Effort normal and breath sounds normal. No respiratory distress. She has no wheezes. She has no rales.       Assessment:     Probable acute frontal and maxillary sinusitis    Plan:     -Augmentin 875 mg twice daily with food for 10 days -Stay well-hydrated -Consider over-the-counter plain Mucinex  Eulas Post MD [facility]

## 2016-02-21 NOTE — Progress Notes (Signed)
Pre visit review using our clinic review tool, if applicable. No additional management support is needed unless otherwise documented below in the visit note. 

## 2016-02-21 NOTE — Patient Instructions (Signed)

## 2016-02-29 ENCOUNTER — Other Ambulatory Visit: Payer: Self-pay

## 2016-02-29 DIAGNOSIS — C50212 Malignant neoplasm of upper-inner quadrant of left female breast: Secondary | ICD-10-CM

## 2016-03-01 ENCOUNTER — Other Ambulatory Visit (HOSPITAL_BASED_OUTPATIENT_CLINIC_OR_DEPARTMENT_OTHER): Payer: Medicare HMO

## 2016-03-01 ENCOUNTER — Ambulatory Visit (HOSPITAL_BASED_OUTPATIENT_CLINIC_OR_DEPARTMENT_OTHER): Payer: Medicare HMO | Admitting: Hematology and Oncology

## 2016-03-01 DIAGNOSIS — R0609 Other forms of dyspnea: Secondary | ICD-10-CM | POA: Diagnosis not present

## 2016-03-01 DIAGNOSIS — Z853 Personal history of malignant neoplasm of breast: Secondary | ICD-10-CM | POA: Diagnosis not present

## 2016-03-01 DIAGNOSIS — Z171 Estrogen receptor negative status [ER-]: Principal | ICD-10-CM

## 2016-03-01 DIAGNOSIS — C50212 Malignant neoplasm of upper-inner quadrant of left female breast: Secondary | ICD-10-CM | POA: Diagnosis not present

## 2016-03-01 DIAGNOSIS — R0600 Dyspnea, unspecified: Secondary | ICD-10-CM | POA: Insufficient documentation

## 2016-03-01 LAB — COMPREHENSIVE METABOLIC PANEL
ALBUMIN: 3.5 g/dL (ref 3.5–5.0)
ALK PHOS: 103 U/L (ref 40–150)
ALT: 16 U/L (ref 0–55)
AST: 20 U/L (ref 5–34)
Anion Gap: 8 mEq/L (ref 3–11)
BILIRUBIN TOTAL: 0.37 mg/dL (ref 0.20–1.20)
BUN: 21.5 mg/dL (ref 7.0–26.0)
CALCIUM: 9.5 mg/dL (ref 8.4–10.4)
CHLORIDE: 104 meq/L (ref 98–109)
CO2: 28 mEq/L (ref 22–29)
CREATININE: 0.9 mg/dL (ref 0.6–1.1)
EGFR: 66 mL/min/{1.73_m2} — ABNORMAL LOW (ref >90)
Glucose: 75 mg/dl (ref 70–140)
Potassium: 4.3 mEq/L (ref 3.5–5.1)
Sodium: 140 mEq/L (ref 136–145)
TOTAL PROTEIN: 6.6 g/dL (ref 6.4–8.3)

## 2016-03-01 LAB — CBC WITH DIFFERENTIAL/PLATELET
BASO%: 1.1 % (ref 0.0–2.0)
Basophils Absolute: 0.1 10*3/uL (ref 0.0–0.1)
EOS%: 4.2 % (ref 0.0–7.0)
Eosinophils Absolute: 0.2 10*3/uL (ref 0.0–0.5)
HEMATOCRIT: 34.5 % — AB (ref 34.8–46.6)
HEMOGLOBIN: 11.1 g/dL — AB (ref 11.6–15.9)
LYMPH#: 1 10*3/uL (ref 0.9–3.3)
LYMPH%: 19.9 % (ref 14.0–49.7)
MCH: 25.6 pg (ref 25.1–34.0)
MCHC: 32.3 g/dL (ref 31.5–36.0)
MCV: 79.1 fL — ABNORMAL LOW (ref 79.5–101.0)
MONO#: 0.4 10*3/uL (ref 0.1–0.9)
MONO%: 7.6 % (ref 0.0–14.0)
NEUT%: 67.2 % (ref 38.4–76.8)
NEUTROS ABS: 3.5 10*3/uL (ref 1.5–6.5)
PLATELETS: 196 10*3/uL (ref 145–400)
RBC: 4.36 10*6/uL (ref 3.70–5.45)
RDW: 15.4 % — AB (ref 11.2–14.5)
WBC: 5.2 10*3/uL (ref 3.9–10.3)

## 2016-03-01 NOTE — Assessment & Plan Note (Signed)
Status post left mastectomy with axillary lymph node dissection November 2011 and triple negative disease with Ki-67 13% followed by immediate reconstruction status post adjuvant chemotherapy with AC followed by Taxol carbo develop neuropathy and myelosuppression  Surveillance:  1. Breast exam 03/01/2016 is normal 2. Mammogram to be done 03/04/2016  Survivorship:Discussed the importance of physical exercise in decreasing the likelihood of breast cancer recurrence. Recommended 30 mins daily 6 days a week of either brisk walking or cycling or swimming. Encouraged patient to eat more fruits and vegetables and decrease red meat.   Since she completed 5 years of therapy, I recommended follow-up with survivorship NP annually. But she wanted to follow with me for a few more years annually. I reviewed her blood work which was normal.

## 2016-03-01 NOTE — Progress Notes (Signed)
Patient Care Team: Laurey Morale, MD as PCP - General  SUMMARY OF ONCOLOGIC HISTORY:   Breast cancer of upper-inner quadrant of left female breast ([place])   03/30/2010 Surgery    Left Mastectomy: Multifocal LN Negative, ER/PR Neg, Her 2 Neg, Ki 67: 18%, Lef Breast reconstruction      04/25/2010 - 09/20/2010 Chemotherapy    AC X 4, Taxol carbo X 7, Taxol X 5       CHIEF COMPLIANT: follow up of breast cancer  INTERVAL HISTORY: Rebekah Moreno is a Lady with above-mentioned history left mastectomy followed by adjuvant chemotherapy 2012. She is here for routine follow-up. She denies any lumps or nodules in the breast  or chest wall.  REVIEW OF SYSTEMS:   Constitutional: Denies fevers, chills or abnormal weight loss Eyes: Denies blurriness of vision Ears, nose, mouth, throat, and face: Denies mucositis or sore throat Respiratory: Denies cough, dyspnea or wheezes Cardiovascular: Denies palpitation, chest discomfort Gastrointestinal:  Denies nausea, heartburn or change in bowel habits Skin: Denies abnormal skin rashes Lymphatics: Denies new lymphadenopathy or easy bruising Neurological:Denies numbness, tingling or new weaknesses Behavioral/Psych: Mood is stable, no new changes  Extremities: No lower extremity edema Breast:  denies any pain or lumps or nodules in either breasts All other systems were reviewed with the patient and are negative.  I have reviewed the past medical history, past surgical history, social history and family history with the patient and they are unchanged from previous note.  ALLERGIES:  is allergic to dust mite extract; mold extract [trichophyton mentagrophyte]; other; and succinylcholine.  MEDICATIONS:  Current Outpatient Prescriptions  Medication Sig Dispense Refill  . acyclovir (ZOVIRAX) 400 MG tablet TAKE 1 TABLET BY MOUTH EVERY 8 HOURS AS NEEDED FOR FEVER BLISTERS 60 tablet 3  . albuterol (PROVENTIL HFA;VENTOLIN HFA) 108 (90 BASE) MCG/ACT inhaler  Inhale 2 puffs into the lungs every 4 (four) hours as needed for wheezing or shortness of breath. 1 Inhaler 11  . ALPRAZolam (XANAX) 0.5 MG tablet Take 1 tablet (0.5 mg total) by mouth 3 (three) times daily as needed. 90 tablet 5  . amoxicillin-clavulanate (AUGMENTIN) 875-125 MG tablet Take 1 tablet by mouth 2 (two) times daily. 20 tablet 0  . celecoxib (CELEBREX) 200 MG capsule TAKE 1 CAPSULE (200 MG TOTAL) BY MOUTH 2 (TWO) TIMES DAILY. 180 capsule 3  . enalapril-hydrochlorothiazide (VASERETIC) 10-25 MG tablet Take 1 tablet by mouth daily. 90 tablet 3  . KLOR-CON 10 10 MEQ tablet TAKE 1 TABLET (10 MEQ TOTAL) BY MOUTH DAILY. 90 tablet 1  . metoprolol succinate (TOPROL-XL) 25 MG 24 hr tablet TAKE 1 TABLET BY MOUTH DAILY 90 tablet 3  . Multiple Vitamins-Minerals (CENTRUM ULTRA WOMENS) TABS Take 1 tablet by mouth daily.      Marland Kitchen NEXIUM 40 MG capsule Take 1 capsule (40 mg total) by mouth daily. 90 capsule 3  . sertraline (ZOLOFT) 100 MG tablet TAKE 1 TABLET BY MOUTH DAILY. 90 tablet 3  . simvastatin (ZOCOR) 40 MG tablet Take 1 tablet (40 mg total) by mouth every evening. 90 tablet 3   No current facility-administered medications for this visit.     PHYSICAL EXAMINATION: ECOG PERFORMANCE STATUS: 0 - Asymptomatic  Vitals:   03/01/16 1103  BP: (!) 133/57  Pulse: (!) 56  Resp: 18  Temp: 97.8 F (36.6 C)   Filed Weights   03/01/16 1103  Weight: 213 lb 11.2 oz (96.9 kg)    GENERAL:alert, no distress and comfortable SKIN: skin  color, texture, turgor are normal, no rashes or significant lesions EYES: normal, Conjunctiva are pink and non-injected, sclera clear OROPHARYNX:no exudate, no erythema and lips, buccal mucosa, and tongue normal  NECK: supple, thyroid normal size, non-tender, without nodularity LYMPH:  no palpable lymphadenopathy in the cervical, axillary or inguinal LUNGS: clear to auscultation and percussion with normal breathing effort HEART: regular rate & rhythm and no murmurs and  no lower extremity edema ABDOMEN:abdomen soft, non-tender and normal bowel sounds MUSCULOSKELETAL:no cyanosis of digits and no clubbing  NEURO: alert & oriented x 3 with fluent speech, no focal motor/sensory deficits EXTREMITIES: No lower extremity edema BREAST: No palpable masses or nodules in either right or left breasts. No palpable axillary supraclavicular or infraclavicular adenopathy no breast tenderness or nipple discharge. (exam performed in the presence of a chaperone)  LABORATORY DATA:  I have reviewed the data as listed   Chemistry      Component Value Date/Time   NA 141 03/01/2015 1529   K 3.5 03/01/2015 1529   CL 103 11/30/2013 0821   CL 104 09/09/2012 0935   CO2 27 03/01/2015 1529   BUN 24.3 03/01/2015 1529   CREATININE 1.0 03/01/2015 1529      Component Value Date/Time   CALCIUM 9.1 03/01/2015 1529   ALKPHOS 112 03/01/2015 1529   AST 21 03/01/2015 1529   ALT 20 03/01/2015 1529   BILITOT 0.39 03/01/2015 1529       Lab Results  Component Value Date   WBC 5.2 03/01/2016   HGB 11.1 (L) 03/01/2016   HCT 34.5 (L) 03/01/2016   MCV 79.1 (L) 03/01/2016   PLT 196 03/01/2016   NEUTROABS 3.5 03/01/2016     ASSESSMENT & PLAN:  Breast cancer of upper-inner quadrant of left female breast Status post left mastectomy with axillary lymph node dissection November 2011 and triple negative disease with Ki-67 13% followed by immediate reconstruction status post adjuvant chemotherapy with AC followed by Taxol carbo develop neuropathy and myelosuppression  Surveillance:  1. Breast exam 03/01/2016 is normal 2. Mammogram to be done 03/04/2016  Survivorship:Discussed the importance of physical exercise in decreasing the likelihood of breast cancer recurrence. Recommended 30 mins daily 6 days a week of either brisk walking or cycling or swimming. Encouraged patient to eat more fruits and vegetables and decrease red meat.   Since she completed 5 years of therapy, I recommended  follow-up with survivorship NP annually. But she wanted to follow with me for a few more years annually. I reviewed her blood work which was normal.   No orders of the defined types were placed in this encounter.  The patient has a good understanding of the overall plan. she agrees with it. she will call with any problems that may develop before the next visit here.   Rulon Eisenmenger, MD 03/01/16

## 2016-03-04 ENCOUNTER — Ambulatory Visit
Admission: RE | Admit: 2016-03-04 | Discharge: 2016-03-04 | Disposition: A | Discharge status: Home / Self Care | Payer: Medicare HMO | Source: Ambulatory Visit | Attending: Hematology and Oncology | Admitting: Hematology and Oncology

## 2016-03-04 ENCOUNTER — Encounter: Payer: Self-pay | Admitting: Hematology and Oncology

## 2016-03-04 DIAGNOSIS — Z1231 Encounter for screening mammogram for malignant neoplasm of breast: Secondary | ICD-10-CM

## 2016-03-07 ENCOUNTER — Ambulatory Visit (HOSPITAL_COMMUNITY)
Admission: RE | Admit: 2016-03-07 | Discharge: 2016-03-07 | Disposition: A | Discharge status: Home / Self Care | Payer: Medicare HMO | Source: Ambulatory Visit | Attending: Hematology and Oncology | Admitting: Hematology and Oncology

## 2016-03-07 DIAGNOSIS — R0609 Other forms of dyspnea: Secondary | ICD-10-CM | POA: Diagnosis not present

## 2016-03-07 DIAGNOSIS — R0600 Dyspnea, unspecified: Secondary | ICD-10-CM

## 2016-03-07 HISTORY — PX: TRANSTHORACIC ECHOCARDIOGRAM: SHX275

## 2016-03-07 MED ORDER — PERFLUTREN LIPID MICROSPHERE
1.0000 mL | INTRAVENOUS | Status: AC | PRN
  Administered 2016-03-07: 2 mL via INTRAVENOUS
  Filled 2016-03-07: qty 10

## 2016-03-07 MED ORDER — PERFLUTREN LIPID MICROSPHERE
INTRAVENOUS | Status: AC
Start: 2016-03-07 — End: 2016-03-07
  Filled 2016-03-07: qty 10

## 2016-03-07 NOTE — Progress Notes (Signed)
  Echocardiogram 2D Echocardiogram with Definity has been performed.  Tresa Res 03/07/2016, 11:42 AM

## 2016-03-12 ENCOUNTER — Telehealth: Payer: Self-pay | Admitting: Family Medicine

## 2016-03-12 MED ORDER — LEVOFLOXACIN 500 MG PO TABS: 500.0000 mg | ORAL_TABLET | Freq: Every day | ORAL | 0 refills | Status: DC

## 2016-03-12 NOTE — Telephone Encounter (Signed)
Call in Levaquin 500 mg daily for 10 days  

## 2016-03-12 NOTE — Telephone Encounter (Signed)
Pt has a cough and coughing up [facility] [place] Pt saw dr Elease Hashimoto on 02/21/16

## 2016-03-12 NOTE — Telephone Encounter (Signed)
[facility] and left a voice message for pt with this information.  

## 2016-03-25 DIAGNOSIS — Z6833 Body mass index (BMI) 33.0-33.9, adult: Secondary | ICD-10-CM | POA: Diagnosis not present

## 2016-03-25 DIAGNOSIS — Z01419 Encounter for gynecological examination (general) (routine) without abnormal findings: Secondary | ICD-10-CM | POA: Diagnosis not present

## 2016-03-29 ENCOUNTER — Other Ambulatory Visit: Payer: Self-pay

## 2016-03-29 ENCOUNTER — Telehealth: Payer: Self-pay

## 2016-03-29 NOTE — Telephone Encounter (Signed)
Returned pt call regarding echocardiogram results. Pt results were normal. Pt reports " I wanted to make sure everything was ok because I have been having worsening of sob for a few weeks now, more with minimal exertion. I am also coughing a lot." Looked at pt last note from pcp. Pt recently medicated with antibiotic for recent sinus infection. Pt had coughing since infection started. Referred pt back to pcp at this time for further management of symptoms. Pt denies any other questions.

## 2016-04-17 ENCOUNTER — Ambulatory Visit (INDEPENDENT_AMBULATORY_CARE_PROVIDER_SITE_OTHER): Payer: Medicare HMO | Admitting: Family Medicine

## 2016-04-17 ENCOUNTER — Encounter: Payer: Self-pay | Admitting: Family Medicine

## 2016-04-17 ENCOUNTER — Other Ambulatory Visit: Payer: Self-pay | Admitting: Family Medicine

## 2016-04-17 VITALS — BP 148/92 | HR 62 | Temp 97.7°F | Ht 66.0 in | Wt 210.0 lb

## 2016-04-17 DIAGNOSIS — J019 Acute sinusitis, unspecified: Secondary | ICD-10-CM | POA: Diagnosis not present

## 2016-04-17 DIAGNOSIS — Z0000 Encounter for general adult medical examination without abnormal findings: Secondary | ICD-10-CM

## 2016-04-17 LAB — BASIC METABOLIC PANEL
BUN: 18 mg/dL (ref 6–23)
CHLORIDE: 102 meq/L (ref 96–112)
CO2: 29 mEq/L (ref 19–32)
Calcium: 9.5 mg/dL (ref 8.4–10.5)
Creatinine, Ser: 0.86 mg/dL (ref 0.40–1.20)
GFR: 69.22 mL/min (ref >60.00)
Glucose, Bld: 114 mg/dL — ABNORMAL HIGH (ref 70–99)
POTASSIUM: 3.6 meq/L (ref 3.5–5.1)
SODIUM: 141 meq/L (ref 135–145)

## 2016-04-17 LAB — CBC WITH DIFFERENTIAL/PLATELET
BASOS ABS: 0 10*3/uL (ref 0.0–0.1)
Basophils Relative: 0.5 % (ref 0.0–3.0)
EOS PCT: 3.4 % (ref 0.0–5.0)
Eosinophils Absolute: 0.2 10*3/uL (ref 0.0–0.7)
HEMATOCRIT: 36.4 % (ref 36.0–46.0)
Hemoglobin: 12.1 g/dL (ref 12.0–15.0)
LYMPHS ABS: 1.4 10*3/uL (ref 0.7–4.0)
LYMPHS PCT: 24.2 % (ref 12.0–46.0)
MCHC: 33.2 g/dL (ref 30.0–36.0)
MCV: 78.2 fl (ref 78.0–100.0)
MONOS PCT: 6.6 % (ref 3.0–12.0)
Monocytes Absolute: 0.4 10*3/uL (ref 0.1–1.0)
NEUTROS ABS: 3.7 10*3/uL (ref 1.4–7.7)
Neutrophils Relative %: 65.3 % (ref 43.0–77.0)
PLATELETS: 194 10*3/uL (ref 150.0–400.0)
RBC: 4.66 Mil/uL (ref 3.87–5.11)
RDW: 14.9 % (ref 11.5–15.5)
WBC: 5.7 10*3/uL (ref 4.0–10.5)

## 2016-04-17 LAB — HEPATIC FUNCTION PANEL
ALK PHOS: 85 U/L (ref 39–117)
ALT: 14 U/L (ref 0–35)
AST: 18 U/L (ref 0–37)
Albumin: 4.3 g/dL (ref 3.5–5.2)
BILIRUBIN DIRECT: 0.1 mg/dL (ref 0.0–0.3)
BILIRUBIN TOTAL: 0.5 mg/dL (ref 0.2–1.2)
Total Protein: 7 g/dL (ref 6.0–8.3)

## 2016-04-17 LAB — POC URINALSYSI DIPSTICK (AUTOMATED)
BILIRUBIN UA: NEGATIVE
Blood, UA: NEGATIVE
Glucose, UA: NEGATIVE
KETONES UA: NEGATIVE
LEUKOCYTES UA: NEGATIVE
NITRITE UA: NEGATIVE
PH UA: 7
Spec Grav, UA: 1.02
Urobilinogen, UA: 0.2

## 2016-04-17 LAB — LIPID PANEL
CHOL/HDL RATIO: 5
Cholesterol: 257 mg/dL — ABNORMAL HIGH (ref 0–200)
HDL: 55.2 mg/dL (ref >39.00)
LDL CALC: 163 mg/dL — AB (ref 0–99)
NONHDL: 201.97
TRIGLYCERIDES: 193 mg/dL — AB (ref 0.0–149.0)
VLDL: 38.6 mg/dL (ref 0.0–40.0)

## 2016-04-17 LAB — TSH: TSH: 2.52 u[IU]/mL (ref 0.35–4.50)

## 2016-04-17 MED ORDER — SERTRALINE HCL 100 MG PO TABS
100.0000 mg | ORAL_TABLET | Freq: Every day | ORAL | 3 refills | Status: DC
Start: 2016-04-17 — End: 2017-04-18

## 2016-04-17 MED ORDER — ALBUTEROL SULFATE HFA 108 (90 BASE) MCG/ACT IN AERS: 2.0000 | INHALATION_SPRAY | RESPIRATORY_TRACT | 11 refills | Status: DC | PRN

## 2016-04-17 MED ORDER — METOPROLOL SUCCINATE ER 25 MG PO TB24: 25.0000 mg | ORAL_TABLET | Freq: Every day | ORAL | 3 refills | Status: DC

## 2016-04-17 MED ORDER — CELECOXIB 200 MG PO CAPS: 200.0000 mg | ORAL_CAPSULE | Freq: Two times a day (BID) | ORAL | 3 refills | Status: DC

## 2016-04-17 MED ORDER — NEXIUM 40 MG PO CPDR: 40.0000 mg | DELAYED_RELEASE_CAPSULE | Freq: Every day | ORAL | 3 refills | Status: DC

## 2016-04-17 MED ORDER — AZITHROMYCIN 250 MG PO TABS: ORAL_TABLET | ORAL | 0 refills | Status: DC

## 2016-04-17 MED ORDER — ACYCLOVIR 400 MG PO TABS: ORAL_TABLET | ORAL | 3 refills | Status: DC

## 2016-04-17 MED ORDER — ENALAPRIL-HYDROCHLOROTHIAZIDE 10-25 MG PO TABS: 1.0000 | ORAL_TABLET | Freq: Every day | ORAL | 3 refills | Status: DC

## 2016-04-17 MED ORDER — SIMVASTATIN 40 MG PO TABS: 40.0000 mg | ORAL_TABLET | Freq: Every evening | ORAL | 3 refills | Status: DC

## 2016-04-17 MED ORDER — POTASSIUM CHLORIDE ER 10 MEQ PO TBCR: EXTENDED_RELEASE_TABLET | ORAL | 3 refills | Status: DC

## 2016-04-17 NOTE — Progress Notes (Signed)
   Subjective:    Patient ID: Rebekah Moreno, female    DOB: 21-Dec-1945, 70 y.o.   MRN: [id number]  HPI 70 yr old female for a well exam. She also has a few issues to discuss. She moved to the [facility] area earlier this year to help her daughter take care of her infant granddaughter. This has been hard on [name] because she misses all her friends here in [place]. She also thinks she has a sinus infection. For the past week she has had sinus congestion and is blowing yellow mucus from the nose. No fever or cough.    Review of Systems  Constitutional: Negative.   HENT: Positive for congestion, postnasal drip, sinus pain and sinus pressure. Negative for dental problem, drooling, ear discharge, ear pain, facial swelling, hearing loss, mouth sores, nosebleeds, rhinorrhea, sneezing, sore throat, tinnitus, trouble swallowing and voice change.   Eyes: Negative.   Respiratory: Negative.   Cardiovascular: Negative.   Gastrointestinal: Negative.   Genitourinary: Negative for decreased urine volume, difficulty urinating, dyspareunia, dysuria, enuresis, flank pain, frequency, hematuria, pelvic pain and urgency.  Musculoskeletal: Negative.   Skin: Negative.   Neurological: Negative.   Psychiatric/Behavioral: Negative.        Objective:   Physical Exam  Constitutional: She is oriented to person, place, and time. She appears well-developed and well-nourished. No distress.  HENT:  Head: Normocephalic and atraumatic.  Right Ear: External ear normal.  Left Ear: External ear normal.  Nose: Nose normal.  Mouth/Throat: Oropharynx is clear and moist. No oropharyngeal exudate.  Eyes: Conjunctivae and EOM are normal. Pupils are equal, round, and reactive to light. No scleral icterus.  Neck: Normal range of motion. Neck supple. No JVD present. No thyromegaly present.  Cardiovascular: Normal rate, regular rhythm, normal heart sounds and intact distal pulses.  Exam reveals no gallop and no friction rub.     No murmur heard. Pulmonary/Chest: Effort normal and breath sounds normal. No respiratory distress. She has no wheezes. She has no rales. She exhibits no tenderness.  Abdominal: Soft. Bowel sounds are normal. She exhibits no distension and no mass. There is no tenderness. There is no rebound and no guarding.  Musculoskeletal: Normal range of motion. She exhibits no edema or tenderness.  Lymphadenopathy:    She has no cervical adenopathy.  Neurological: She is alert and oriented to person, place, and time. She has normal reflexes. No cranial nerve deficit. She exhibits normal muscle tone. Coordination normal.  Skin: Skin is warm and dry. No rash noted. No erythema.  Psychiatric: She has a normal mood and affect. Her behavior is normal. Judgment and thought content normal.          Assessment & Plan:  Well exam. We discussed diet and exercise. Get fasting labs today. Treat the sinusitis with a Zpack.  Laurey Morale, MD

## 2016-04-17 NOTE — Progress Notes (Signed)
Pre visit review using our clinic review tool, if applicable. No additional management support is needed unless otherwise documented below in the visit note. 

## 2016-04-18 ENCOUNTER — Telehealth: Payer: Self-pay

## 2016-04-18 NOTE — Telephone Encounter (Signed)
Call to Rebekah Moreno to schedule AWV ; states she is now living in [facility] but does come to [place] every week Scheduled apt for 12/13 at [place]

## 2016-05-01 ENCOUNTER — Ambulatory Visit: Payer: Medicare HMO

## 2016-06-14 ENCOUNTER — Telehealth: Payer: Self-pay | Admitting: Family Medicine

## 2016-06-14 NOTE — Telephone Encounter (Signed)
Call in #[phone] with 5 rf 

## 2016-06-17 NOTE — Telephone Encounter (Signed)
Pt request refill  ALPRAZolam (XANAX) 0.5 MG tablet  [facility] [address] [place]

## 2016-06-17 NOTE — Telephone Encounter (Signed)
I called in script 

## 2016-06-21 ENCOUNTER — Ambulatory Visit (INDEPENDENT_AMBULATORY_CARE_PROVIDER_SITE_OTHER): Payer: Medicare HMO | Admitting: Family Medicine

## 2016-06-21 ENCOUNTER — Encounter: Payer: Self-pay | Admitting: Family Medicine

## 2016-06-21 VITALS — BP 122/78 | HR 60 | Temp 97.4°F | Ht 66.0 in | Wt 211.0 lb

## 2016-06-21 DIAGNOSIS — K219 Gastro-esophageal reflux disease without esophagitis: Secondary | ICD-10-CM | POA: Diagnosis not present

## 2016-06-21 DIAGNOSIS — K120 Recurrent oral aphthae: Secondary | ICD-10-CM | POA: Diagnosis not present

## 2016-06-21 DIAGNOSIS — J018 Other acute sinusitis: Secondary | ICD-10-CM

## 2016-06-21 MED ORDER — LEVOFLOXACIN 500 MG PO TABS
500.0000 mg | ORAL_TABLET | Freq: Every day | ORAL | 0 refills | Status: AC
Start: 2016-06-21 — End: 2016-07-01

## 2016-06-21 NOTE — Progress Notes (Signed)
   Subjective:    Patient ID: Rebekah Moreno, female    DOB: 09/25/1945, 71 y.o.   MRN: [id number]  HPI Here for 3 separate issues. First she has had sinus pressure and PND for the past 2 weeks. This has caused a ST, hoarseness and a dry cough. No fever. Second, for the past 3 days she has had a painful lesion on the tongue. Third, she had an episode yesterday morning of chest pain and tightness which came shortly after having a breakfast of coffee and oatmeal. The pain started in the epigastrium and spread up to the chest. No SOB or nausea. Belching made her feel better. She took a Zantac and within 15 minutes all these symptoms resolved. She has had no such symptoms ever since.   Review of Systems  Constitutional: Negative.   HENT: Positive for congestion, mouth sores, postnasal drip, sinus pain, sinus pressure, sore throat and voice change. Negative for trouble swallowing.   Eyes: Negative.   Respiratory: Positive for cough. Negative for shortness of breath and wheezing.   Cardiovascular: Positive for chest pain. Negative for palpitations and leg swelling.  Gastrointestinal: Negative.   Neurological: Negative.        Objective:   Physical Exam  Constitutional: She is oriented to person, place, and time. She appears well-developed and well-nourished. No distress.  HENT:  Right Ear: External ear normal.  Left Ear: External ear normal.  Nose: Nose normal.  Posterior OP is clear. There is a single shallow aphthous ulcer on the right.   Eyes: Conjunctivae are normal.  Neck: Neck supple. No thyromegaly present.  Pulmonary/Chest: Effort normal and breath sounds normal. No respiratory distress. She has no wheezes. She has no rales. She exhibits no tenderness.  Abdominal: Soft. Bowel sounds are normal. She exhibits no distension and no mass. There is no tenderness. There is no rebound and no guarding.  Lymphadenopathy:    She has no cervical adenopathy.  Neurological: She is alert and  oriented to person, place, and time.          Assessment & Plan:  Treat the sinusitis with Levaquin. She is reassured the aphthous ulcer will resolve on its own. She had an episode of GERD with esophageal spasm yesterday. She will avoid spicy foods and reduce her caffeine intake. Use Zantac prn.  Alysia Penna, MD

## 2016-06-25 DIAGNOSIS — H527 Unspecified disorder of refraction: Secondary | ICD-10-CM | POA: Diagnosis not present

## 2016-06-25 DIAGNOSIS — H26493 Other secondary cataract, bilateral: Secondary | ICD-10-CM | POA: Diagnosis not present

## 2016-06-26 ENCOUNTER — Telehealth: Payer: Self-pay | Admitting: Family Medicine

## 2016-06-26 NOTE — Telephone Encounter (Signed)
Call in Biaxin 500 mg bid for 10 days  

## 2016-06-26 NOTE — Telephone Encounter (Signed)
Pt would like to have something different b/c she is doing a lot more coughing.  Pharm:  [facility]   Pts called was dropped maybe she went through a dead zone.

## 2016-06-26 NOTE — Telephone Encounter (Signed)
Pt called back.  Pt states she is up and down all night long, still not better.

## 2016-06-27 MED ORDER — CLARITHROMYCIN 500 MG PO TABS: 500.0000 mg | ORAL_TABLET | Freq: Two times a day (BID) | ORAL | 0 refills | Status: DC

## 2016-06-27 NOTE — Telephone Encounter (Signed)
[facility] and spoke [facility]  

## 2016-06-28 DIAGNOSIS — L821 Other seborrheic keratosis: Secondary | ICD-10-CM | POA: Diagnosis not present

## 2016-06-28 DIAGNOSIS — L720 Epidermal cyst: Secondary | ICD-10-CM | POA: Diagnosis not present

## 2016-06-28 DIAGNOSIS — Z85828 Personal history of other malignant neoplasm of skin: Secondary | ICD-10-CM | POA: Diagnosis not present

## 2016-06-28 DIAGNOSIS — L738 Other specified follicular disorders: Secondary | ICD-10-CM | POA: Diagnosis not present

## 2016-06-28 DIAGNOSIS — D2272 Melanocytic nevi of left lower limb, including hip: Secondary | ICD-10-CM | POA: Diagnosis not present

## 2016-06-28 DIAGNOSIS — D1801 Hemangioma of skin and subcutaneous tissue: Secondary | ICD-10-CM | POA: Diagnosis not present

## 2016-06-28 DIAGNOSIS — L812 Freckles: Secondary | ICD-10-CM | POA: Diagnosis not present

## 2016-07-02 ENCOUNTER — Encounter: Payer: Self-pay | Admitting: Family Medicine

## 2016-07-02 ENCOUNTER — Ambulatory Visit (INDEPENDENT_AMBULATORY_CARE_PROVIDER_SITE_OTHER): Payer: Medicare HMO | Admitting: Family Medicine

## 2016-07-02 VITALS — BP 148/85 | HR 65 | Temp 98.3°F | Ht 66.0 in | Wt 212.0 lb

## 2016-07-02 DIAGNOSIS — J209 Acute bronchitis, unspecified: Secondary | ICD-10-CM

## 2016-07-02 MED ORDER — HYDROCODONE-HOMATROPINE 5-1.5 MG/5ML PO SYRP: 5.0000 mL | ORAL_SOLUTION | ORAL | 0 refills | Status: DC | PRN

## 2016-07-02 MED ORDER — METHYLPREDNISOLONE 4 MG PO TBPK: ORAL_TABLET | ORAL | 0 refills | Status: DC

## 2016-07-02 NOTE — Progress Notes (Signed)
   Subjective:    Patient ID: Rebekah Moreno, female    DOB: [birth year], 71 y.o.   MRN: [id number]  HPI Here for continuing chest  tightness and coughing up green sputum. She was seen here on 06-21-16 and was started on Levaquin. She did not improve so 5 days later she was switched to Biaxin. She still has the cough. No fever.    Review of Systems  Constitutional: Negative.   HENT: Negative.   Eyes: Negative.   Respiratory: Positive for cough and chest tightness. Negative for shortness of breath and wheezing.        Objective:   Physical Exam  Constitutional: She appears well-developed and well-nourished.  HENT:  Right Ear: External ear normal.  Left Ear: External ear normal.  Nose: Nose normal.  Mouth/Throat: Oropharynx is clear and moist.  Eyes: Conjunctivae are normal.  Neck: No thyromegaly present.  Pulmonary/Chest: Effort normal. No respiratory distress. She has no wheezes. She has no rales.  Scattered rhonchi   Lymphadenopathy:    She has no cervical adenopathy.          Assessment & Plan:  Partially treated bronchitis. Add a Medrol dose pack and continue the Biaxin.  Alysia Penna, MD

## 2016-07-02 NOTE — Progress Notes (Signed)
Pre visit review using our clinic review tool, if applicable. No additional management support is needed unless otherwise documented below in the visit note. 

## 2016-07-06 DIAGNOSIS — M1711 Unilateral primary osteoarthritis, right knee: Secondary | ICD-10-CM | POA: Diagnosis not present

## 2016-07-12 ENCOUNTER — Ambulatory Visit (INDEPENDENT_AMBULATORY_CARE_PROVIDER_SITE_OTHER): Payer: Medicare HMO | Admitting: Family Medicine

## 2016-07-12 ENCOUNTER — Encounter: Payer: Self-pay | Admitting: Family Medicine

## 2016-07-12 VITALS — BP 138/74 | Temp 97.8°F | Ht 66.0 in | Wt 210.2 lb

## 2016-07-12 DIAGNOSIS — B370 Candidal stomatitis: Secondary | ICD-10-CM

## 2016-07-12 MED ORDER — NYSTATIN 100000 UNIT/ML MT SUSP: 5.0000 mL | Freq: Four times a day (QID) | OROMUCOSAL | 2 refills | Status: DC

## 2016-07-12 NOTE — Progress Notes (Signed)
   Subjective:    Patient ID: Rebekah Moreno, female    DOB: [birth year], 71 y.o.   MRN: [id number]  HPI Here for 5 days of soreness in the mouth and on the tongue. Her tongue was white for a day. No ST. She recently took a course of Levaquin followed by a course of Biaxin, along with a prednisone dose pack, for a URI.    Review of Systems  Constitutional: Negative.   HENT: Positive for mouth sores. Negative for postnasal drip, sore throat and trouble swallowing.   Eyes: Negative.   Respiratory: Negative.        Objective:   Physical Exam  Constitutional: She appears well-developed and well-nourished.  HENT:  Right Ear: External ear normal.  Left Ear: External ear normal.  Nose: Nose normal.  Mouth/Throat: Oropharynx is clear and moist. No oropharyngeal exudate.  Eyes: Conjunctivae are normal.  Neck: Neck supple. No thyromegaly present.  Pulmonary/Chest: Effort normal and breath sounds normal.  Lymphadenopathy:    She has no cervical adenopathy.          Assessment & Plan:  Thrush, treat with Nystatin oral suspension.  Alysia Penna, MD

## 2016-07-26 ENCOUNTER — Telehealth: Payer: Self-pay

## 2016-07-26 NOTE — Telephone Encounter (Signed)
Received PA request from insurance company for Nexium 40 mg capsules. PA submitted & is pending. Key: [id number]

## 2016-07-26 NOTE — Telephone Encounter (Signed)
PA approved, form faxed back to pharmacy. 

## 2016-08-12 ENCOUNTER — Other Ambulatory Visit: Payer: Self-pay | Admitting: Family Medicine

## 2016-08-12 MED ORDER — NEXIUM 40 MG PO CPDR: 40.0000 mg | DELAYED_RELEASE_CAPSULE | Freq: Every day | ORAL | 3 refills | Status: DC

## 2016-08-12 NOTE — Telephone Encounter (Signed)
Rx refill for Nexium #90 with 3 refills sent to [facility]

## 2016-08-12 NOTE — Telephone Encounter (Signed)
Pt need new Rx for Nexium #90  Pharm:  [facility]  Pt would like to have 3 prescriptions for #90 on each and the insurance will assist with the pay pt state the insurance has changed.

## 2016-08-30 ENCOUNTER — Ambulatory Visit (INDEPENDENT_AMBULATORY_CARE_PROVIDER_SITE_OTHER): Payer: Medicare HMO | Admitting: Internal Medicine

## 2016-08-30 ENCOUNTER — Encounter: Payer: Self-pay | Admitting: Internal Medicine

## 2016-08-30 VITALS — BP 120/78 | HR 66 | Temp 97.7°F | Wt 215.6 lb

## 2016-08-30 DIAGNOSIS — J0191 Acute recurrent sinusitis, unspecified: Secondary | ICD-10-CM | POA: Diagnosis not present

## 2016-08-30 DIAGNOSIS — H109 Unspecified conjunctivitis: Secondary | ICD-10-CM

## 2016-08-30 MED ORDER — AMOXICILLIN-POT CLAVULANATE 875-125 MG PO TABS: 1.0000 | ORAL_TABLET | Freq: Two times a day (BID) | ORAL | 0 refills | Status: DC

## 2016-08-30 MED ORDER — POLYMYXIN B-TRIMETHOPRIM 10000-0.1 UNIT/ML-% OP SOLN: 1.0000 [drp] | OPHTHALMIC | 0 refills | Status: DC

## 2016-08-30 NOTE — Progress Notes (Signed)
Chief Complaint  Patient presents with  . Sinus Problem    HPI: Rebekah Moreno 71 y.o.  sda PCP  Dr Sarajane Jews Has sinus idsues  Usually spring  But has had  Nasal surgery and facila trauma in past  . Using flonase and otc sinus medication  Has had a week  Of ongoing green nasal dranaige and  Am cough phleg.   Left facial fullness and pain  And the last 1-2 days eye itchy and irritated  And this am closed shut    She has implant and worried about infection  Pink eye going around the day care.    Has 26 mo old grand child.  No fever sneezing but does have a HA .    ROS: See pertinent positives and negatives per HPI. No cp sob   Past Medical History:  Diagnosis Date  . Allergy   . Anxiety   . Arthritis   . Asthma   . Cancer [facility])    Breast, sees Dr. Frutoso Chase   . Colon polyps   . Compression fx, lumbar spine ([place])    l2  . GERD (gastroesophageal reflux disease)   . Hematuria   . Hemorrhoids   . Hyperlipidemia   . Hypertension   . Osteoporosis   . Peptic stricture of esophagus     Family History  Problem Relation Age of Onset  . Heart failure Mother   . Arthritis    . Hypertension    . Cancer    . Stroke    . Heart disease    . Heart disease Paternal Grandmother   . Colon cancer Neg Hx     Social History   Social History  . Marital status: Widowed    Spouse name: N/A  . Number of children: N/A  . Years of education: N/A   Social History Main Topics  . Smoking status: Never Smoker  . Smokeless tobacco: Never Used  . Alcohol use No     Comment: rare  . Drug use: No  . Sexual activity: Yes    Birth control/ protection: Post-menopausal   Other Topics Concern  . None   Social History Narrative  . None    Outpatient Medications Prior to Visit  Medication Sig Dispense Refill  . acyclovir (ZOVIRAX) 400 MG tablet TAKE 1 TABLET BY MOUTH EVERY 8 HOURS AS NEEDED FOR FEVER BLISTERS 60 tablet 3  . albuterol (PROVENTIL HFA;VENTOLIN HFA) 108 (90 Base) MCG/ACT  inhaler Inhale 2 puffs into the lungs every 4 (four) hours as needed for wheezing or shortness of breath. 1 Inhaler 11  . ALPRAZolam (XANAX) 0.5 MG tablet TAKE 1 TABLET BY MOUTH 3 TIMES A DAY AS NEEDED 90 tablet 5  . celecoxib (CELEBREX) 200 MG capsule Take 1 capsule (200 mg total) by mouth 2 (two) times daily. 180 capsule 3  . enalapril-hydrochlorothiazide (VASERETIC) 10-25 MG tablet Take 1 tablet by mouth daily. 90 tablet 3  . metoprolol succinate (TOPROL-XL) 25 MG 24 hr tablet Take 1 tablet (25 mg total) by mouth daily. 90 tablet 3  . Multiple Vitamins-Minerals (CENTRUM ULTRA WOMENS) TABS Take 1 tablet by mouth daily.      Marland Kitchen NEXIUM 40 MG capsule Take 1 capsule (40 mg total) by mouth daily. 90 capsule 3  . potassium chloride (KLOR-CON 10) 10 MEQ tablet TAKE 1 TABLET (10 MEQ TOTAL) BY MOUTH DAILY. 90 tablet 3  . sertraline (ZOLOFT) 100 MG tablet Take 1 tablet (100 mg total)  by mouth daily. 90 tablet 3  . simvastatin (ZOCOR) 40 MG tablet Take 1 tablet (40 mg total) by mouth every evening. 90 tablet 3  . clarithromycin (BIAXIN) 500 MG tablet Take 1 tablet (500 mg total) by mouth 2 (two) times daily. 20 tablet 0  . nystatin (MYCOSTATIN) 100000 UNIT/ML suspension Take 5 mLs (500,000 Units total) by mouth 4 (four) times daily. 473 mL 2   No facility-administered medications prior to visit.      EXAM:  BP 120/78 (BP Location: Right Arm, Patient Position: Sitting, Cuff Size: Normal)   Pulse 66   Temp 97.7 F (36.5 C) (Oral)   Wt 215 lb 9.6 oz (97.8 kg)   SpO2 97%   BMI 34.80 kg/m   Body mass index is 34.8 kg/m.  GENERAL: vitals reviewed and listed above, alert, oriented, appears well hydrated and in no acute distress  Nasally congested  Mildly  Eye ake up mild eye injection  HEENT: atraumatic, conjunctiva  1+ redness no dc seen and no ciliary flush   No lesion seen , no obvious abnormalities on inspection of external nose and ears asymmetric tmx  Some wax but  Nl lm Tender  Left  ethmoid  maxilla   OP : no lesion edema or exudate  White yellow pnd  NECK: no obvious masses on inspection palpation  CV: HRRR, no clubbing cyanosis or  peripheral edema nl cap refill  MS: moves all extremities without noticeable focal  abnormality pleasant and cooperative, no obvious depression or anxiety  ASSESSMENT AND PLAN:  Discussed the following assessment and plan:  Conjunctivitis of both eyes, unspecified conjunctivitis type  Acute recurrent sinusitis, unspecified location Hx of facial trauma and nasal surgery   plus prob seasonal  Allergy?  At risk   Eye sx may be allergic but pat concern about infection  So rx eye drops and then  Oral antibiotic for  Sinus infection  Acute on chronic?   Disc      Expectant management.  And fu  -Patient advised to return or notify health care team  if symptoms worsen ,persist or new concerns arise.  Patient Instructions  Cool compresses to eys and can add eye drops  As discussed   Antibiotic to add to your rx for sinus infection   Saline and flonase good also for your sinus infection .     Sinusitis, Adult Sinusitis is soreness and inflammation of your sinuses. Sinuses are hollow spaces in the bones around your face. Your sinuses are located:  Around your eyes.  In the middle of your forehead.  Behind your nose.  In your cheekbones. Your sinuses and nasal passages are lined with a stringy fluid (mucus). Mucus normally drains out of your sinuses. When your nasal tissues become inflamed or swollen, the mucus can become trapped or blocked so air cannot flow through your sinuses. This allows bacteria, viruses, and funguses to grow, which leads to infection. Sinusitis can develop quickly and last for 7?10 days (acute) or for more than 12 weeks (chronic). Sinusitis often develops after a cold. What are the causes? This condition is caused by anything that creates swelling in the sinuses or stops mucus from draining,  including:  Allergies.  Asthma.  Bacterial or viral infection.  Abnormally shaped bones between the nasal passages.  Nasal growths that contain mucus (nasal polyps).  Narrow sinus openings.  Pollutants, such as chemicals or irritants in the air.  A foreign object stuck in the nose.  A  fungal infection. This is rare. What increases the risk? The following factors may make you more likely to develop this condition:  Having allergies or asthma.  Having had a recent cold or respiratory tract infection.  Having structural deformities or blockages in your nose or sinuses.  Having a weak immune system.  Doing a lot of swimming or diving.  Overusing nasal sprays.  Smoking. What are the signs or symptoms? The main symptoms of this condition are pain and a feeling of pressure around the affected sinuses. Other symptoms include:  Upper toothache.  Earache.  Headache.  Bad breath.  Decreased sense of smell and taste.  A cough that may get worse at night.  Fatigue.  Fever.  Thick drainage from your nose. The drainage is often green and it may contain pus (purulent).  Stuffy nose or congestion.  Postnasal drip. This is when extra mucus collects in the throat or back of the nose.  Swelling and warmth over the affected sinuses.  Sore throat.  Sensitivity to light. How is this diagnosed? This condition is diagnosed based on symptoms, a medical history, and a physical exam. To find out if your condition is acute or chronic, your health care provider may:  Look in your nose for signs of nasal polyps.  Tap over the affected sinus to check for signs of infection.  View the inside of your sinuses using an imaging device that has a light attached (endoscope). If your health care provider suspects that you have chronic sinusitis, you may also:  Be tested for allergies.  Have a sample of mucus taken from your nose (nasal culture) and checked for bacteria.  Have a  mucus sample examined to see if your sinusitis is related to an allergy. If your sinusitis does not respond to treatment and it lasts longer than 8 weeks, you may have an MRI or CT scan to check your sinuses. These scans also help to determine how severe your infection is. In rare cases, a bone biopsy may be done to rule out more serious types of fungal sinus disease. How is this treated? Treatment for sinusitis depends on the cause and whether your condition is chronic or acute. If a virus is causing your sinusitis, your symptoms will go away on their own within 10 days. You may be given medicines to relieve your symptoms, including:  Topical nasal decongestants. They shrink swollen nasal passages and let mucus drain from your sinuses.  Antihistamines. These drugs block inflammation that is triggered by allergies. This can help to ease swelling in your nose and sinuses.  Topical nasal corticosteroids. These are nasal sprays that ease inflammation and swelling in your nose and sinuses.  Nasal saline washes. These rinses can help to get rid of thick mucus in your nose. If your condition is caused by bacteria, you will be given an antibiotic medicine. If your condition is caused by a fungus, you will be given an antifungal medicine. Surgery may be needed to correct underlying conditions, such as narrow nasal passages. Surgery may also be needed to remove polyps. Follow these instructions at home: Medicines   Take, use, or apply over-the-counter and prescription medicines only as told by your health care provider. These may include nasal sprays.  If you were prescribed an antibiotic medicine, take it as told by your health care provider. Do not stop taking the antibiotic even if you start to feel better. Hydrate and Humidify   Drink enough water to keep your urine clear  or pale yellow. Staying hydrated will help to thin your mucus.  Use a cool mist humidifier to keep the humidity level in your  home above 50%.  Inhale steam for 10-15 minutes, 3-4 times a day or as told by your health care provider. You can do this in the bathroom while a hot shower is running.  Limit your exposure to cool or dry air. Rest   Rest as much as possible.  Sleep with your head raised (elevated).  Make sure to get enough sleep each night. General instructions   Apply a warm, moist washcloth to your face 3-4 times a day or as told by your health care provider. This will help with discomfort.  Wash your hands often with soap and water to reduce your exposure to viruses and other germs. If soap and water are not available, use hand sanitizer.  Do not smoke. Avoid being around people who are smoking (secondhand smoke).  Keep all follow-up visits as told by your health care provider. This is important. Contact a health care provider if:  You have a fever.  Your symptoms get worse.  Your symptoms do not improve within 10 days. Get help right away if:  You have a severe headache.  You have persistent vomiting.  You have pain or swelling around your face or eyes.  You have vision problems.  You develop confusion.  Your neck is stiff.  You have trouble breathing. This information is not intended to replace advice given to you by your health care provider. Make sure you discuss any questions you have with your health care provider. Document Released: 05/06/2005 Document Revised: 12/31/2015 Document Reviewed: 03/01/2015 Elsevier Interactive Patient Education  2017 [employer]. Tauriel Scronce M.D.

## 2016-08-30 NOTE — Patient Instructions (Signed)
Cool compresses to eys and can add eye drops  As discussed   Antibiotic to add to your rx for sinus infection   Saline and flonase good also for your sinus infection .     Sinusitis, Adult Sinusitis is soreness and inflammation of your sinuses. Sinuses are hollow spaces in the bones around your face. Your sinuses are located:  Around your eyes.  In the middle of your forehead.  Behind your nose.  In your cheekbones. Your sinuses and nasal passages are lined with a stringy fluid (mucus). Mucus normally drains out of your sinuses. When your nasal tissues become inflamed or swollen, the mucus can become trapped or blocked so air cannot flow through your sinuses. This allows bacteria, viruses, and funguses to grow, which leads to infection. Sinusitis can develop quickly and last for 7?10 days (acute) or for more than 12 weeks (chronic). Sinusitis often develops after a cold. What are the causes? This condition is caused by anything that creates swelling in the sinuses or stops mucus from draining, including:  Allergies.  Asthma.  Bacterial or viral infection.  Abnormally shaped bones between the nasal passages.  Nasal growths that contain mucus (nasal polyps).  Narrow sinus openings.  Pollutants, such as chemicals or irritants in the air.  A foreign object stuck in the nose.  A fungal infection. This is rare. What increases the risk? The following factors may make you more likely to develop this condition:  Having allergies or asthma.  Having had a recent cold or respiratory tract infection.  Having structural deformities or blockages in your nose or sinuses.  Having a weak immune system.  Doing a lot of swimming or diving.  Overusing nasal sprays.  Smoking. What are the signs or symptoms? The main symptoms of this condition are pain and a feeling of pressure around the affected sinuses. Other symptoms include:  Upper toothache.  Earache.  Headache.  Bad  breath.  Decreased sense of smell and taste.  A cough that may get worse at night.  Fatigue.  Fever.  Thick drainage from your nose. The drainage is often green and it may contain pus (purulent).  Stuffy nose or congestion.  Postnasal drip. This is when extra mucus collects in the throat or back of the nose.  Swelling and warmth over the affected sinuses.  Sore throat.  Sensitivity to light. How is this diagnosed? This condition is diagnosed based on symptoms, a medical history, and a physical exam. To find out if your condition is acute or chronic, your health care provider may:  Look in your nose for signs of nasal polyps.  Tap over the affected sinus to check for signs of infection.  View the inside of your sinuses using an imaging device that has a light attached (endoscope). If your health care provider suspects that you have chronic sinusitis, you may also:  Be tested for allergies.  Have a sample of mucus taken from your nose (nasal culture) and checked for bacteria.  Have a mucus sample examined to see if your sinusitis is related to an allergy. If your sinusitis does not respond to treatment and it lasts longer than 8 weeks, you may have an MRI or CT scan to check your sinuses. These scans also help to determine how severe your infection is. In rare cases, a bone biopsy may be done to rule out more serious types of fungal sinus disease. How is this treated? Treatment for sinusitis depends on the cause and whether  your condition is chronic or acute. If a virus is causing your sinusitis, your symptoms will go away on their own within 10 days. You may be given medicines to relieve your symptoms, including:  Topical nasal decongestants. They shrink swollen nasal passages and let mucus drain from your sinuses.  Antihistamines. These drugs block inflammation that is triggered by allergies. This can help to ease swelling in your nose and sinuses.  Topical nasal  corticosteroids. These are nasal sprays that ease inflammation and swelling in your nose and sinuses.  Nasal saline washes. These rinses can help to get rid of thick mucus in your nose. If your condition is caused by bacteria, you will be given an antibiotic medicine. If your condition is caused by a fungus, you will be given an antifungal medicine. Surgery may be needed to correct underlying conditions, such as narrow nasal passages. Surgery may also be needed to remove polyps. Follow these instructions at home: Medicines   Take, use, or apply over-the-counter and prescription medicines only as told by your health care provider. These may include nasal sprays.  If you were prescribed an antibiotic medicine, take it as told by your health care provider. Do not stop taking the antibiotic even if you start to feel better. Hydrate and Humidify   Drink enough water to keep your urine clear or pale yellow. Staying hydrated will help to thin your mucus.  Use a cool mist humidifier to keep the humidity level in your home above 50%.  Inhale steam for 10-15 minutes, 3-4 times a day or as told by your health care provider. You can do this in the bathroom while a hot shower is running.  Limit your exposure to cool or dry air. Rest   Rest as much as possible.  Sleep with your head raised (elevated).  Make sure to get enough sleep each night. General instructions   Apply a warm, moist washcloth to your face 3-4 times a day or as told by your health care provider. This will help with discomfort.  Wash your hands often with soap and water to reduce your exposure to viruses and other germs. If soap and water are not available, use hand sanitizer.  Do not smoke. Avoid being around people who are smoking (secondhand smoke).  Keep all follow-up visits as told by your health care provider. This is important. Contact a health care provider if:  You have a fever.  Your symptoms get worse.  Your  symptoms do not improve within 10 days. Get help right away if:  You have a severe headache.  You have persistent vomiting.  You have pain or swelling around your face or eyes.  You have vision problems.  You develop confusion.  Your neck is stiff.  You have trouble breathing. This information is not intended to replace advice given to you by your health care provider. Make sure you discuss any questions you have with your health care provider. Document Released: 05/06/2005 Document Revised: 12/31/2015 Document Reviewed: 03/01/2015 Elsevier Interactive Patient Education  2017 [employer].

## 2016-09-12 DIAGNOSIS — M859 Disorder of bone density and structure, unspecified: Secondary | ICD-10-CM | POA: Diagnosis not present

## 2016-10-02 DIAGNOSIS — M859 Disorder of bone density and structure, unspecified: Secondary | ICD-10-CM | POA: Diagnosis not present

## 2016-12-22 ENCOUNTER — Other Ambulatory Visit: Payer: Self-pay | Admitting: Family Medicine

## 2016-12-23 NOTE — Telephone Encounter (Signed)
Call in #[phone] with 5 rf 

## 2016-12-24 NOTE — Telephone Encounter (Signed)
Rx done. 

## 2017-02-17 ENCOUNTER — Other Ambulatory Visit: Payer: Self-pay | Admitting: Hematology and Oncology

## 2017-02-17 DIAGNOSIS — Z853 Personal history of malignant neoplasm of breast: Secondary | ICD-10-CM

## 2017-02-17 DIAGNOSIS — Z9889 Other specified postprocedural states: Secondary | ICD-10-CM

## 2017-02-28 ENCOUNTER — Telehealth: Payer: Self-pay | Admitting: Hematology and Oncology

## 2017-02-28 ENCOUNTER — Ambulatory Visit (HOSPITAL_BASED_OUTPATIENT_CLINIC_OR_DEPARTMENT_OTHER): Payer: Medicare HMO

## 2017-02-28 ENCOUNTER — Ambulatory Visit (HOSPITAL_BASED_OUTPATIENT_CLINIC_OR_DEPARTMENT_OTHER): Payer: Medicare HMO | Admitting: Hematology and Oncology

## 2017-02-28 ENCOUNTER — Ambulatory Visit (HOSPITAL_COMMUNITY)
Admission: RE | Admit: 2017-02-28 | Discharge: 2017-02-28 | Disposition: A | Discharge status: Home / Self Care | Payer: Medicare HMO | Source: Ambulatory Visit | Attending: Hematology and Oncology | Admitting: Hematology and Oncology

## 2017-02-28 VITALS — BP 133/58 | HR 69 | Temp 98.5°F | Resp 16 | Ht 66.0 in | Wt 219.2 lb

## 2017-02-28 DIAGNOSIS — I700 Atherosclerosis of aorta: Secondary | ICD-10-CM | POA: Diagnosis not present

## 2017-02-28 DIAGNOSIS — C50212 Malignant neoplasm of upper-inner quadrant of left female breast: Secondary | ICD-10-CM | POA: Diagnosis not present

## 2017-02-28 DIAGNOSIS — Z171 Estrogen receptor negative status [ER-]: Secondary | ICD-10-CM

## 2017-02-28 DIAGNOSIS — R0602 Shortness of breath: Secondary | ICD-10-CM

## 2017-02-28 DIAGNOSIS — R51 Headache: Secondary | ICD-10-CM

## 2017-02-28 DIAGNOSIS — Z853 Personal history of malignant neoplasm of breast: Secondary | ICD-10-CM

## 2017-02-28 DIAGNOSIS — K449 Diaphragmatic hernia without obstruction or gangrene: Secondary | ICD-10-CM | POA: Insufficient documentation

## 2017-02-28 LAB — CBC WITH DIFFERENTIAL/PLATELET
BASO%: 0.9 % (ref 0.0–2.0)
Basophils Absolute: 0 10*3/uL (ref 0.0–0.1)
EOS%: 4.8 % (ref 0.0–7.0)
Eosinophils Absolute: 0.2 10*3/uL (ref 0.0–0.5)
HEMATOCRIT: 34.3 % — AB (ref 34.8–46.6)
HGB: 11.1 g/dL — ABNORMAL LOW (ref 11.6–15.9)
LYMPH#: 0.9 10*3/uL (ref 0.9–3.3)
LYMPH%: 20.6 % (ref 14.0–49.7)
MCH: 25.8 pg (ref 25.1–34.0)
MCHC: 32.3 g/dL (ref 31.5–36.0)
MCV: 79.9 fL (ref 79.5–101.0)
MONO#: 0.4 10*3/uL (ref 0.1–0.9)
MONO%: 9 % (ref 0.0–14.0)
NEUT%: 64.7 % (ref 38.4–76.8)
NEUTROS ABS: 2.9 10*3/uL (ref 1.5–6.5)
Platelets: 175 10*3/uL (ref 145–400)
RBC: 4.29 10*6/uL (ref 3.70–5.45)
RDW: 15.1 % — ABNORMAL HIGH (ref 11.2–14.5)
WBC: 4.4 10*3/uL (ref 3.9–10.3)

## 2017-02-28 LAB — COMPREHENSIVE METABOLIC PANEL
ALT: 22 U/L (ref 0–55)
AST: 26 U/L (ref 5–34)
Albumin: 3.9 g/dL (ref 3.5–5.0)
Alkaline Phosphatase: 85 U/L (ref 40–150)
Anion Gap: 9 mEq/L (ref 3–11)
BUN: 21.5 mg/dL (ref 7.0–26.0)
CHLORIDE: 103 meq/L (ref 98–109)
CO2: 29 mEq/L (ref 22–29)
Calcium: 9.1 mg/dL (ref 8.4–10.4)
Creatinine: 1 mg/dL (ref 0.6–1.1)
EGFR: 59 mL/min/{1.73_m2} — ABNORMAL LOW (ref >60)
GLUCOSE: 92 mg/dL (ref 70–140)
POTASSIUM: 4 meq/L (ref 3.5–5.1)
SODIUM: 141 meq/L (ref 136–145)
Total Bilirubin: 0.46 mg/dL (ref 0.20–1.20)
Total Protein: 6.7 g/dL (ref 6.4–8.3)

## 2017-02-28 NOTE — Progress Notes (Signed)
Patient Care Team: Laurey Morale, MD as PCP - General  DIAGNOSIS:  Encounter Diagnoses  Name Primary?  . Malignant neoplasm of upper-inner quadrant of left breast in female, estrogen receptor negative ([place])   . Shortness of breath Yes    SUMMARY OF ONCOLOGIC HISTORY:   Breast cancer of upper-inner quadrant of left female breast ([place])   03/30/2010 Surgery    Left Mastectomy: Multifocal LN Negative, ER/PR Neg, Her 2 Neg, Ki 67: 18%, Lef Breast reconstruction      04/25/2010 - 09/20/2010 Chemotherapy    AC X 4, Taxol carbo X 7, Taxol X 5       CHIEF COMPLIANT: Surveillance of triple negative breast cancer, complaining of shortness of breath and intermittent headaches  INTERVAL HISTORY: Rebekah Moreno is a 71 year old with above-mentioned symptoms of breast cancer treated with mastectomy and adjuvant chemotherapy and is currently on surveillance. She is here for annual follow-up. She has moved to [facility] and wants to keep her follow-ups with [place]. 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 She complains of shortness of breath to minimal exertion. She also complains of headaches.  REVIEW OF SYSTEMS:   Constitutional: Denies fevers, chills or abnormal weight loss Eyes: Denies blurriness of vision Ears, nose, mouth, throat, and face: Denies mucositis or sore throat Respiratory: Shortness of breath Cardiovascular: Denies palpitation, chest discomfort Gastrointestinal:  Denies nausea, heartburn or change in bowel habits Skin: Denies abnormal skin rashes Lymphatics: Denies new lymphadenopathy or easy bruising Neurological:Denies numbness, tingling or new weaknesses Behavioral/Psych: Mood is stable, no new changes  Extremities: No lower extremity edema Breast:  denies any pain or lumps or nodules in either breasts All other systems were reviewed with the patient and are negative.  I have  reviewed the past medical history, past surgical history, social history and family history with the patient and they are unchanged from previous note.  ALLERGIES:  is allergic to dust mite extract; mold extract [trichophyton mentagrophyte]; other; and succinylcholine.  MEDICATIONS:  Current Outpatient Prescriptions  Medication Sig Dispense Refill  . acyclovir (ZOVIRAX) 400 MG tablet TAKE 1 TABLET BY MOUTH EVERY 8 HOURS AS NEEDED FOR FEVER BLISTERS 60 tablet 3  . albuterol (PROVENTIL HFA;VENTOLIN HFA) 108 (90 Base) MCG/ACT inhaler Inhale 2 puffs into the lungs every 4 (four) hours as needed for wheezing or shortness of breath. 1 Inhaler 11  . ALPRAZolam (XANAX) 0.5 MG tablet TAKE 1 TABLET BY MOUTH 3 TIMES A DAY AS NEEDED 90 tablet 5  . amoxicillin-clavulanate (AUGMENTIN) 875-125 MG tablet Take 1 tablet by mouth every 12 (twelve) hours. 14 tablet 0  . celecoxib (CELEBREX) 200 MG capsule Take 1 capsule (200 mg total) by mouth 2 (two) times daily. 180 capsule 3  . enalapril-hydrochlorothiazide (VASERETIC) 10-25 MG tablet Take 1 tablet by mouth daily. 90 tablet 3  . metoprolol succinate (TOPROL-XL) 25 MG 24 hr tablet Take 1 tablet (25 mg total) by mouth daily. 90 tablet 3  . Multiple Vitamins-Minerals (CENTRUM ULTRA WOMENS) TABS Take 1 tablet by mouth daily.      Marland Kitchen NEXIUM 40 MG capsule Take 1 capsule (40 mg total) by mouth daily. 90 capsule 3  . potassium chloride (KLOR-CON 10) 10 MEQ tablet TAKE 1 TABLET (10 MEQ TOTAL) BY MOUTH DAILY. 90 tablet 3  . sertraline (ZOLOFT) 100 MG tablet Take 1 tablet (100 mg total) by mouth daily. 90 tablet 3  .  simvastatin (ZOCOR) 40 MG tablet Take 1 tablet (40 mg total) by mouth every evening. 90 tablet 3  . trimethoprim-polymyxin b (POLYTRIM) ophthalmic solution Place 1 drop into both eyes every 4 (four) hours. In day 10 mL 0   No current facility-administered medications for this visit.     PHYSICAL EXAMINATION: ECOG PERFORMANCE STATUS: 1 - Symptomatic but  completely ambulatory  Vitals:   02/28/17 1110  BP: (!) 133/58  Pulse: 69  Resp: 16  Temp: 98.5 F (36.9 C)  SpO2: 97%   Filed Weights   02/28/17 1110  Weight: 219 lb 3.2 oz (99.4 kg)    GENERAL:alert, no distress and comfortable SKIN: skin color, texture, turgor are normal, no rashes or significant lesions EYES: normal, Conjunctiva are pink and non-injected, sclera clear OROPHARYNX:no exudate, no erythema and lips, buccal mucosa, and tongue normal  NECK: supple, thyroid normal size, non-tender, without nodularity LYMPH:  no palpable lymphadenopathy in the cervical, axillary or inguinal LUNGS: clear to auscultation and percussion with normal breathing effort HEART: regular rate & rhythm and no murmurs and no lower extremity edema ABDOMEN:abdomen soft, non-tender and normal bowel sounds MUSCULOSKELETAL:no cyanosis of digits and no clubbing  NEURO: alert & oriented x 3 with fluent speech, no focal motor/sensory deficits EXTREMITIES: No lower extremity edema BREAST: No palpable masses or nodules in either right or left breasts. No palpable axillary supraclavicular or infraclavicular adenopathy no breast tenderness or nipple discharge. (exam performed in the presence of a chaperone)  LABORATORY DATA:  I have reviewed the data as listed   Chemistry      Component Value Date/Time   NA 141 04/17/2016 1105   NA 140 03/01/2016 1044   K 3.6 04/17/2016 1105   K 4.3 03/01/2016 1044   CL 102 04/17/2016 1105   CL 104 09/09/2012 0935   CO2 29 04/17/2016 1105   CO2 28 03/01/2016 1044   BUN 18 04/17/2016 1105   BUN 21.5 03/01/2016 1044   CREATININE 0.86 04/17/2016 1105   CREATININE 0.9 03/01/2016 1044      Component Value Date/Time   CALCIUM 9.5 04/17/2016 1105   CALCIUM 9.5 03/01/2016 1044   ALKPHOS 85 04/17/2016 1105   ALKPHOS 103 03/01/2016 1044   AST 18 04/17/2016 1105   AST 20 03/01/2016 1044   ALT 14 04/17/2016 1105   ALT 16 03/01/2016 1044   BILITOT 0.5 04/17/2016 1105     BILITOT 0.37 03/01/2016 1044       Lab Results  Component Value Date   WBC 5.7 04/17/2016   HGB 12.1 04/17/2016   HCT 36.4 04/17/2016   MCV 78.2 04/17/2016   PLT 194.0 04/17/2016   NEUTROABS 3.7 04/17/2016    ASSESSMENT & PLAN:  Breast cancer of upper-inner quadrant of left female breast Status post left mastectomy with axillary lymph node dissection November 2011 and triple negative disease with Ki-67 13% followed by immediate reconstruction status post adjuvant chemotherapy with AC followed by Taxol carbo develop neuropathy and myelosuppression  Surveillance:  1. Breast exam 02/28/2017 is normal 2. Mammogram to be done 03/05/2016  Shortness of breath to minimal exertion: I would like up chest x-ray today. We are also like to get CBC and a CMP done today as well. I will call her with the results of these tests.   I spent 25 minutes talking to the patient of which more than half was spent in counseling and coordination of care.  Orders Placed This Encounter  Procedures  . DG Chest  2 View    Standing Status:   Future    Standing Expiration Date:   04/04/2018    Order Specific Question:   Reason for exam:    Answer:   shortness of breath    Order Specific Question:   Preferred imaging location?    Answer:   [facility]  . CBC with Differential    Standing Status:   Future    Standing Expiration Date:   02/28/2018  . Comprehensive metabolic panel    Standing Status:   Future    Standing Expiration Date:   02/28/2018   The patient has a good understanding of the overall plan. she agrees with it. she will call with any problems that may develop before the next visit here.   Rulon Eisenmenger, MD 02/28/17

## 2017-02-28 NOTE — Telephone Encounter (Signed)
Gave patient AVS and calendar of upcoming October 2019 appointments.

## 2017-02-28 NOTE — Assessment & Plan Note (Signed)
Status post left mastectomy with axillary lymph node dissection November 2011 and triple negative disease with Ki-67 13% followed by immediate reconstruction status post adjuvant chemotherapy with AC followed by Taxol carbo develop neuropathy and myelosuppression  Surveillance:  1. Breast exam 02/28/2017 is normal 2. Mammogram to be done 03/05/2016  Since she completed 5 years of therapy, I recommended follow-up with survivorship NP annually. But she wanted to follow with me for a few more years annually. I reviewed her blood work which was normal.

## 2017-03-05 ENCOUNTER — Ambulatory Visit
Admission: RE | Admit: 2017-03-05 | Discharge: 2017-03-05 | Disposition: A | Discharge status: Home / Self Care | Payer: Medicare HMO | Source: Ambulatory Visit | Attending: Hematology and Oncology | Admitting: Hematology and Oncology

## 2017-03-05 DIAGNOSIS — Z853 Personal history of malignant neoplasm of breast: Secondary | ICD-10-CM

## 2017-03-05 DIAGNOSIS — Z9889 Other specified postprocedural states: Secondary | ICD-10-CM

## 2017-03-05 DIAGNOSIS — Z1231 Encounter for screening mammogram for malignant neoplasm of breast: Secondary | ICD-10-CM | POA: Diagnosis not present

## 2017-03-05 HISTORY — DX: Personal history of antineoplastic chemotherapy: Z92.21

## 2017-03-07 DIAGNOSIS — R69 Illness, unspecified: Secondary | ICD-10-CM | POA: Diagnosis not present

## 2017-03-18 ENCOUNTER — Ambulatory Visit (INDEPENDENT_AMBULATORY_CARE_PROVIDER_SITE_OTHER): Payer: Medicare HMO | Admitting: Family Medicine

## 2017-03-18 ENCOUNTER — Encounter: Payer: Self-pay | Admitting: Family Medicine

## 2017-03-18 VITALS — BP 136/68 | Temp 98.6°F | Ht 66.0 in | Wt 217.0 lb

## 2017-03-18 DIAGNOSIS — B029 Zoster without complications: Secondary | ICD-10-CM | POA: Diagnosis not present

## 2017-03-18 MED ORDER — METHYLPREDNISOLONE 4 MG PO TBPK: ORAL_TABLET | ORAL | 0 refills | Status: DC

## 2017-03-18 MED ORDER — VALACYCLOVIR HCL 1 G PO TABS: 1000.0000 mg | ORAL_TABLET | Freq: Three times a day (TID) | ORAL | 0 refills | Status: DC

## 2017-03-18 NOTE — Progress Notes (Signed)
   Subjective:    Patient ID: Rebekah Moreno, female    DOB: [birth year], 71 y.o.   MRN: [id number]  HPI Here for one week of itching on the right side of her scalp and the right forehead. Then yesterday she noticed red bumps come up in these areas. No real pain. Her vision is normal and there is no eye pain.    Review of Systems  Constitutional: Negative.   Eyes: Negative.   Respiratory: Negative.   Cardiovascular: Negative.   Skin: Positive for rash.       Objective:   Physical Exam  Constitutional: She appears well-developed and well-nourished.  Eyes: Pupils are equal, round, and reactive to light. Conjunctivae and EOM are normal.  Neck: Neck supple. No thyromegaly present.  Cardiovascular: Normal rate, regular rhythm, normal heart sounds and intact distal pulses.   Pulmonary/Chest: Effort normal and breath sounds normal. No respiratory distress. She has no wheezes. She has no rales.  Lymphadenopathy:    She has no cervical adenopathy.  Skin:  The right parietal and temporal scalp areas and the right forehead have scattered red papules and vesicles.           Assessment & Plan:  This is shingles. Treat with Valtrex and a Medrol dose pack. She knows to seek immediate attention if the right eye develops vision changes or redness or pain. Alysia Penna, MD

## 2017-03-18 NOTE — Patient Instructions (Signed)
WE NOW OFFER   [facility] FAST TRACK!!!  SAME DAY Appointments for ACUTE CARE  Such as: Sprains, Injuries, cuts, abrasions, rashes, muscle pain, joint pain, back pain Colds, flu, sore throats, headache, allergies, cough, fever  Ear pain, sinus and eye infections Abdominal pain, nausea, vomiting, diarrhea, upset stomach Animal/insect bites  3 Easy Ways to Schedule: Walk-In Scheduling Call in scheduling Mychart Sign-up: [email or web address]         

## 2017-03-31 ENCOUNTER — Other Ambulatory Visit: Payer: Self-pay | Admitting: Family Medicine

## 2017-04-02 ENCOUNTER — Other Ambulatory Visit: Payer: Self-pay | Admitting: Family Medicine

## 2017-04-02 DIAGNOSIS — Z6834 Body mass index (BMI) 34.0-34.9, adult: Secondary | ICD-10-CM | POA: Diagnosis not present

## 2017-04-02 DIAGNOSIS — N958 Other specified menopausal and perimenopausal disorders: Secondary | ICD-10-CM | POA: Diagnosis not present

## 2017-04-02 DIAGNOSIS — Z124 Encounter for screening for malignant neoplasm of cervix: Secondary | ICD-10-CM | POA: Diagnosis not present

## 2017-04-02 DIAGNOSIS — M816 Localized osteoporosis [Lequesne]: Secondary | ICD-10-CM | POA: Diagnosis not present

## 2017-04-07 ENCOUNTER — Other Ambulatory Visit: Payer: Self-pay | Admitting: Family Medicine

## 2017-04-07 NOTE — Telephone Encounter (Signed)
Patient is calling saying her pharmacy has not received the request for he medicine. She called them this am. [facility] [place] ENALAPRIL.

## 2017-04-07 NOTE — Telephone Encounter (Signed)
Called [facility] [place] and they verified that they did not receive the Rx that was sent on 03/21/2017. Called in prescription for the pt. Called pt and made her aware of the situation and advised her Rx was call in today.

## 2017-04-07 NOTE — Telephone Encounter (Signed)
See attached

## 2017-04-18 ENCOUNTER — Encounter: Payer: Self-pay | Admitting: Family Medicine

## 2017-04-18 ENCOUNTER — Ambulatory Visit (INDEPENDENT_AMBULATORY_CARE_PROVIDER_SITE_OTHER): Payer: Medicare HMO | Admitting: Family Medicine

## 2017-04-18 VITALS — BP 132/84 | HR 69 | Temp 97.9°F | Ht 66.0 in | Wt 219.2 lb

## 2017-04-18 DIAGNOSIS — E785 Hyperlipidemia, unspecified: Secondary | ICD-10-CM

## 2017-04-18 DIAGNOSIS — M159 Polyosteoarthritis, unspecified: Secondary | ICD-10-CM

## 2017-04-18 DIAGNOSIS — C50212 Malignant neoplasm of upper-inner quadrant of left female breast: Secondary | ICD-10-CM | POA: Diagnosis not present

## 2017-04-18 DIAGNOSIS — R69 Illness, unspecified: Secondary | ICD-10-CM | POA: Diagnosis not present

## 2017-04-18 DIAGNOSIS — I10 Essential (primary) hypertension: Secondary | ICD-10-CM

## 2017-04-18 DIAGNOSIS — F320 Major depressive disorder, single episode, mild: Secondary | ICD-10-CM

## 2017-04-18 DIAGNOSIS — M150 Primary generalized (osteo)arthritis: Secondary | ICD-10-CM | POA: Diagnosis not present

## 2017-04-18 DIAGNOSIS — K219 Gastro-esophageal reflux disease without esophagitis: Secondary | ICD-10-CM

## 2017-04-18 DIAGNOSIS — J018 Other acute sinusitis: Secondary | ICD-10-CM | POA: Diagnosis not present

## 2017-04-18 LAB — POC URINALSYSI DIPSTICK (AUTOMATED)
BILIRUBIN UA: NEGATIVE
Glucose, UA: NEGATIVE
Ketones, UA: NEGATIVE
Nitrite, UA: NEGATIVE
PH UA: 6 (ref 5.0–8.0)
Protein, UA: NEGATIVE
RBC UA: NEGATIVE
Spec Grav, UA: >=1.03 — AB (ref 1.010–1.025)
Urobilinogen, UA: 0.2 E.U./dL

## 2017-04-18 LAB — CBC WITH DIFFERENTIAL/PLATELET
BASOS ABS: 0 10*3/uL (ref 0.0–0.1)
Basophils Relative: 0.7 % (ref 0.0–3.0)
EOS ABS: 0.3 10*3/uL (ref 0.0–0.7)
Eosinophils Relative: 5.2 % — ABNORMAL HIGH (ref 0.0–5.0)
HCT: 37.4 % (ref 36.0–46.0)
Hemoglobin: 12 g/dL (ref 12.0–15.0)
LYMPHS ABS: 1.3 10*3/uL (ref 0.7–4.0)
Lymphocytes Relative: 25.5 % (ref 12.0–46.0)
MCHC: 32 g/dL (ref 30.0–36.0)
MCV: 80.7 fl (ref 78.0–100.0)
MONO ABS: 0.3 10*3/uL (ref 0.1–1.0)
Monocytes Relative: 6.1 % (ref 3.0–12.0)
NEUTROS ABS: 3.2 10*3/uL (ref 1.4–7.7)
NEUTROS PCT: 62.5 % (ref 43.0–77.0)
PLATELETS: 195 10*3/uL (ref 150.0–400.0)
RBC: 4.63 Mil/uL (ref 3.87–5.11)
RDW: 15.8 % — ABNORMAL HIGH (ref 11.5–15.5)
WBC: 5.1 10*3/uL (ref 4.0–10.5)

## 2017-04-18 LAB — LIPID PANEL
CHOLESTEROL: 220 mg/dL — AB (ref 0–200)
HDL: 53.5 mg/dL (ref >39.00)
LDL CALC: 137 mg/dL — AB (ref 0–99)
NonHDL: 166.78
TRIGLYCERIDES: 148 mg/dL (ref 0.0–149.0)
Total CHOL/HDL Ratio: 4
VLDL: 29.6 mg/dL (ref 0.0–40.0)

## 2017-04-18 LAB — BASIC METABOLIC PANEL
BUN: 21 mg/dL (ref 6–23)
CHLORIDE: 101 meq/L (ref 96–112)
CO2: 31 mEq/L (ref 19–32)
CREATININE: 1.03 mg/dL (ref 0.40–1.20)
Calcium: 9.8 mg/dL (ref 8.4–10.5)
GFR: 56.05 mL/min — AB (ref >60.00)
GLUCOSE: 120 mg/dL — AB (ref 70–99)
POTASSIUM: 3.9 meq/L (ref 3.5–5.1)
Sodium: 140 mEq/L (ref 135–145)

## 2017-04-18 LAB — HEPATIC FUNCTION PANEL
ALBUMIN: 4.4 g/dL (ref 3.5–5.2)
ALT: 16 U/L (ref 0–35)
AST: 20 U/L (ref 0–37)
Alkaline Phosphatase: 81 U/L (ref 39–117)
Bilirubin, Direct: 0.1 mg/dL (ref 0.0–0.3)
Total Bilirubin: 0.5 mg/dL (ref 0.2–1.2)
Total Protein: 7 g/dL (ref 6.0–8.3)

## 2017-04-18 LAB — TSH: TSH: 3.31 u[IU]/mL (ref 0.35–4.50)

## 2017-04-18 MED ORDER — AZITHROMYCIN 250 MG PO TABS: ORAL_TABLET | ORAL | 0 refills | Status: DC

## 2017-04-18 MED ORDER — SERTRALINE HCL 100 MG PO TABS: 100.0000 mg | ORAL_TABLET | Freq: Every day | ORAL | 3 refills | Status: DC

## 2017-04-18 MED ORDER — POTASSIUM CHLORIDE ER 10 MEQ PO TBCR: EXTENDED_RELEASE_TABLET | ORAL | 3 refills | Status: DC

## 2017-04-18 NOTE — Progress Notes (Signed)
   Subjective:    Patient ID: Rebekah Moreno, female    DOB: 12-09-1945, 71 y.o.   MRN: [id number]  HPI Here to follow up on issues and for a week of stuffy head, PND, and dry cough. No fever. Her BP has been stable. Her arthritis and depression have been stable.    Review of Systems  Constitutional: Negative.   HENT: Positive for congestion, postnasal drip, sinus pressure and sore throat.   Eyes: Negative.   Respiratory: Negative.   Cardiovascular: Negative.   Gastrointestinal: Negative.   Genitourinary: Negative for decreased urine volume, difficulty urinating, dyspareunia, dysuria, enuresis, flank pain, frequency, hematuria, pelvic pain and urgency.  Musculoskeletal: Positive for arthralgias.  Skin: Negative.   Neurological: Negative.   Psychiatric/Behavioral: Negative.        Objective:   Physical Exam  Constitutional: She is oriented to person, place, and time. She appears well-developed and well-nourished. No distress.  HENT:  Head: Normocephalic and atraumatic.  Right Ear: External ear normal.  Left Ear: External ear normal.  Nose: Nose normal.  Mouth/Throat: Oropharynx is clear and moist. No oropharyngeal exudate.  Eyes: Conjunctivae and EOM are normal. Pupils are equal, round, and reactive to light. No scleral icterus.  Neck: Normal range of motion. Neck supple. No JVD present. No thyromegaly present.  Cardiovascular: Normal rate, regular rhythm, normal heart sounds and intact distal pulses. Exam reveals no gallop and no friction rub.  No murmur heard. Pulmonary/Chest: Effort normal and breath sounds normal. No respiratory distress. She has no wheezes. She has no rales. She exhibits no tenderness.  Abdominal: Soft. Bowel sounds are normal. She exhibits no distension and no mass. There is no tenderness. There is no rebound and no guarding.  Musculoskeletal: Normal range of motion. She exhibits no edema or tenderness.  Lymphadenopathy:    She has no cervical  adenopathy.  Neurological: She is alert and oriented to person, place, and time. She has normal reflexes. No cranial nerve deficit. She exhibits normal muscle tone. Coordination normal.  Skin: Skin is warm and dry. No rash noted. No erythema.  Psychiatric: She has a normal mood and affect. Her behavior is normal. Judgment and thought content normal.          Assessment & Plan:  She has a sinusitis, we will treat with a Zpack. Her HTN is stable and she will get fasting labs today. Her arthritis and GERD are stable.  Alysia Penna, MD

## 2017-04-19 ENCOUNTER — Other Ambulatory Visit: Payer: Self-pay | Admitting: Family Medicine

## 2017-04-23 DIAGNOSIS — H5213 Myopia, bilateral: Secondary | ICD-10-CM | POA: Diagnosis not present

## 2017-05-02 DIAGNOSIS — M859 Disorder of bone density and structure, unspecified: Secondary | ICD-10-CM | POA: Diagnosis not present

## 2017-06-18 ENCOUNTER — Other Ambulatory Visit: Payer: Self-pay | Admitting: Family Medicine

## 2017-06-19 NOTE — Telephone Encounter (Signed)
Last OV 04/18/2017   Last refilled 04/17/2016 disp 1 with 11 refills

## 2017-06-30 DIAGNOSIS — D225 Melanocytic nevi of trunk: Secondary | ICD-10-CM | POA: Diagnosis not present

## 2017-06-30 DIAGNOSIS — L812 Freckles: Secondary | ICD-10-CM | POA: Diagnosis not present

## 2017-06-30 DIAGNOSIS — D1801 Hemangioma of skin and subcutaneous tissue: Secondary | ICD-10-CM | POA: Diagnosis not present

## 2017-06-30 DIAGNOSIS — M25561 Pain in right knee: Secondary | ICD-10-CM | POA: Diagnosis not present

## 2017-06-30 DIAGNOSIS — M1711 Unilateral primary osteoarthritis, right knee: Secondary | ICD-10-CM | POA: Diagnosis not present

## 2017-06-30 DIAGNOSIS — Z85828 Personal history of other malignant neoplasm of skin: Secondary | ICD-10-CM | POA: Diagnosis not present

## 2017-06-30 DIAGNOSIS — L821 Other seborrheic keratosis: Secondary | ICD-10-CM | POA: Diagnosis not present

## 2017-06-30 DIAGNOSIS — L578 Other skin changes due to chronic exposure to nonionizing radiation: Secondary | ICD-10-CM | POA: Diagnosis not present

## 2017-07-18 ENCOUNTER — Other Ambulatory Visit: Payer: Self-pay | Admitting: Family Medicine

## 2017-07-20 ENCOUNTER — Telehealth: Payer: Self-pay | Admitting: Family Medicine

## 2017-07-21 NOTE — Telephone Encounter (Signed)
Call in #[phone] with 5 rf 

## 2017-07-21 NOTE — Telephone Encounter (Signed)
Last OV 04/18/2017   Last refilled 12/24/2016 disp 90 with 5 refills   Sent to PCP for approval

## 2017-07-23 ENCOUNTER — Other Ambulatory Visit: Payer: Self-pay | Admitting: Family Medicine

## 2017-07-23 NOTE — Telephone Encounter (Signed)
Patient called into [facility] upset [facility]raz[facility]am refill. She is at the pharmacy now.  Per our records, rx was called in yesterday.  [facility] and spoke with [place] and phoned in the medication as requested.  No further needs at this time.   Wilburn Cornelia, do you know if rx was actually phoned in once you signed the order in Epic yesterday? Or did the order get signed without being called in? Or maybe phoned to the wrong pharmacy?

## 2017-07-23 NOTE — Telephone Encounter (Signed)
Copied from [place]. Topic: Quick Communication - See Telephone Encounter >> Jul 23, 2017  1:30 PM Vernona Rieger wrote: CRM for notification. See Telephone encounter for:   07/23/17.  [facility] [place] [facility] not [facility] her ALPRAZolam (XANAX) 0.5 MG tablet, please re-send

## 2017-07-24 ENCOUNTER — Other Ambulatory Visit: Payer: Self-pay | Admitting: Family Medicine

## 2017-07-24 NOTE — Telephone Encounter (Signed)
Xanax refill request.   Pharmacy never received the request.  Can it be resent?  Thanks.    Controlled substance.  Dr. Sar[facility] Jews  [facility] [place] [place]

## 2017-07-24 NOTE — Telephone Encounter (Signed)
Called the pharmacy and they stated that they do have the Rx on file for xanax. Called the pt and left a VM that the pharmacy does have her xanax RX ready for pick up.

## 2017-07-24 NOTE — Telephone Encounter (Signed)
This was called in yesterday.

## 2017-07-28 ENCOUNTER — Encounter: Payer: Self-pay | Admitting: Family Medicine

## 2017-07-28 ENCOUNTER — Ambulatory Visit (INDEPENDENT_AMBULATORY_CARE_PROVIDER_SITE_OTHER): Payer: Medicare HMO | Admitting: Family Medicine

## 2017-07-28 VITALS — BP 120/78 | HR 85 | Temp 98.3°F | Wt 220.4 lb

## 2017-07-28 DIAGNOSIS — N390 Urinary tract infection, site not specified: Secondary | ICD-10-CM

## 2017-07-28 DIAGNOSIS — L0232 Furuncle of buttock: Secondary | ICD-10-CM | POA: Diagnosis not present

## 2017-07-28 DIAGNOSIS — R8290 Unspecified abnormal findings in urine: Secondary | ICD-10-CM

## 2017-07-28 DIAGNOSIS — J018 Other acute sinusitis: Secondary | ICD-10-CM | POA: Diagnosis not present

## 2017-07-28 LAB — POCT URINALYSIS DIPSTICK
GLUCOSE UA: NEGATIVE
KETONES UA: NEGATIVE
Nitrite, UA: NEGATIVE
RBC UA: NEGATIVE
Urobilinogen, UA: 0.2 E.U./dL
pH, UA: 5.5 (ref 5.0–8.0)

## 2017-07-28 MED ORDER — HYDROCODONE-HOMATROPINE 5-1.5 MG/5ML PO SYRP: 5.0000 mL | ORAL_SOLUTION | ORAL | 0 refills | Status: DC | PRN

## 2017-07-28 MED ORDER — LEVOFLOXACIN 500 MG PO TABS: 500.0000 mg | ORAL_TABLET | Freq: Every day | ORAL | 0 refills | Status: AC

## 2017-07-28 NOTE — Progress Notes (Signed)
   Subjective:    Patient ID: Rebekah Moreno, female    DOB: 03/18/1946, 72 y.o.   MRN: [id number]  HPI Here for several issues. First she has had sinus congestion and PND for about 2 months. No fever or ST. Lately the drainage has caused her to have a lot of dry coughing. Second she has noticed a strong odor to her urine for several days. No burning or urgency. Third she has had a tender boil on the right buttock for a week.    Review of Systems  Constitutional: Negative.   HENT: Positive for congestion and postnasal drip. Negative for sinus pressure, sinus pain and sore throat.   Eyes: Negative.   Respiratory: Positive for cough and chest tightness.   Cardiovascular: Negative.   Genitourinary: Negative for dysuria, flank pain, hematuria and pelvic pain.       Objective:   Physical Exam  Constitutional: She is oriented to person, place, and time. She appears well-developed and well-nourished.  HENT:  Right Ear: External ear normal.  Left Ear: External ear normal.  Nose: Nose normal.  Mouth/Throat: Oropharynx is clear and moist.  Eyes: Conjunctivae are normal.  Neck: No thyromegaly present.  Pulmonary/Chest: Effort normal and breath sounds normal. No respiratory distress. She has no wheezes. She has no rales.  Lymphadenopathy:    She has no cervical adenopathy.  Neurological: She is alert and oriented to person, place, and time.  Skin:  There is a small tender boil on the right buttock          Assessment & Plan:  She has a sinusitis which is causing her to cough. Treat with levaquin for 10 days. She also has a UTI and a boil. These should be well covered by the levaquin also. Culture the urine sample.  Alysia Penna, MD

## 2017-07-30 LAB — URINE CULTURE
MICRO NUMBER:: 90308548
SPECIMEN QUALITY:: ADEQUATE

## 2017-09-05 ENCOUNTER — Other Ambulatory Visit: Payer: Self-pay | Admitting: Family Medicine

## 2017-09-24 ENCOUNTER — Other Ambulatory Visit: Payer: Self-pay | Admitting: Family Medicine

## 2017-10-15 ENCOUNTER — Other Ambulatory Visit: Payer: Self-pay | Admitting: Family Medicine

## 2017-10-15 NOTE — Telephone Encounter (Signed)
Last OV 07/28/2017   Last refilled 04/21/2017 disp 180 with 1 refill   Sent to PCP for approval

## 2018-01-13 ENCOUNTER — Other Ambulatory Visit: Payer: Self-pay | Admitting: Family Medicine

## 2018-01-14 DIAGNOSIS — H16141 Punctate keratitis, right eye: Secondary | ICD-10-CM | POA: Diagnosis not present

## 2018-01-14 DIAGNOSIS — H1031 Unspecified acute conjunctivitis, right eye: Secondary | ICD-10-CM | POA: Diagnosis not present

## 2018-01-16 ENCOUNTER — Other Ambulatory Visit: Payer: Self-pay | Admitting: Family Medicine

## 2018-01-16 NOTE — Telephone Encounter (Signed)
Last OV 07/28/2017   Last refilled 07/22/2017 disp 90 with 5 refills   Sent to PCP to advise

## 2018-01-21 DIAGNOSIS — H26492 Other secondary cataract, left eye: Secondary | ICD-10-CM | POA: Diagnosis not present

## 2018-01-21 DIAGNOSIS — H26491 Other secondary cataract, right eye: Secondary | ICD-10-CM | POA: Diagnosis not present

## 2018-01-21 DIAGNOSIS — H1013 Acute atopic conjunctivitis, bilateral: Secondary | ICD-10-CM | POA: Diagnosis not present

## 2018-01-21 NOTE — Telephone Encounter (Signed)
Call in #[phone] with 5 rf 

## 2018-01-22 DIAGNOSIS — H1013 Acute atopic conjunctivitis, bilateral: Secondary | ICD-10-CM | POA: Diagnosis not present

## 2018-01-22 DIAGNOSIS — H26491 Other secondary cataract, right eye: Secondary | ICD-10-CM | POA: Diagnosis not present

## 2018-01-22 DIAGNOSIS — H26492 Other secondary cataract, left eye: Secondary | ICD-10-CM | POA: Diagnosis not present

## 2018-02-11 DIAGNOSIS — R69 Illness, unspecified: Secondary | ICD-10-CM | POA: Diagnosis not present

## 2018-02-27 ENCOUNTER — Telehealth: Payer: Self-pay | Admitting: Hematology and Oncology

## 2018-02-27 ENCOUNTER — Inpatient Hospital Stay (HOSPITAL_BASED_OUTPATIENT_CLINIC_OR_DEPARTMENT_OTHER): Payer: Medicare HMO | Admitting: Hematology and Oncology

## 2018-02-27 ENCOUNTER — Inpatient Hospital Stay: Payer: Medicare HMO | Attending: Hematology and Oncology

## 2018-02-27 ENCOUNTER — Other Ambulatory Visit: Payer: Self-pay

## 2018-02-27 DIAGNOSIS — Z9221 Personal history of antineoplastic chemotherapy: Secondary | ICD-10-CM

## 2018-02-27 DIAGNOSIS — R0602 Shortness of breath: Secondary | ICD-10-CM | POA: Insufficient documentation

## 2018-02-27 DIAGNOSIS — Z9012 Acquired absence of left breast and nipple: Secondary | ICD-10-CM | POA: Diagnosis not present

## 2018-02-27 DIAGNOSIS — Z79899 Other long term (current) drug therapy: Secondary | ICD-10-CM

## 2018-02-27 DIAGNOSIS — C50212 Malignant neoplasm of upper-inner quadrant of left female breast: Secondary | ICD-10-CM | POA: Diagnosis not present

## 2018-02-27 DIAGNOSIS — D649 Anemia, unspecified: Secondary | ICD-10-CM

## 2018-02-27 DIAGNOSIS — N289 Disorder of kidney and ureter, unspecified: Secondary | ICD-10-CM | POA: Insufficient documentation

## 2018-02-27 DIAGNOSIS — Z421 Encounter for breast reconstruction following mastectomy: Secondary | ICD-10-CM | POA: Diagnosis not present

## 2018-02-27 DIAGNOSIS — Z171 Estrogen receptor negative status [ER-]: Secondary | ICD-10-CM | POA: Insufficient documentation

## 2018-02-27 LAB — CBC WITH DIFFERENTIAL (CANCER CENTER ONLY)
ABS IMMATURE GRANULOCYTES: 0.04 10*3/uL (ref 0.00–0.07)
BASOS ABS: 0 10*3/uL (ref 0.0–0.1)
Basophils Relative: 1 %
Eosinophils Absolute: 0.3 10*3/uL (ref 0.0–0.5)
Eosinophils Relative: 5 %
HEMATOCRIT: 34.5 % — AB (ref 36.0–46.0)
Hemoglobin: 10.8 g/dL — ABNORMAL LOW (ref 12.0–15.0)
IMMATURE GRANULOCYTES: 1 %
LYMPHS ABS: 1.7 10*3/uL (ref 0.7–4.0)
LYMPHS PCT: 25 %
MCH: 26 pg (ref 26.0–34.0)
MCHC: 31.3 g/dL (ref 30.0–36.0)
MCV: 82.9 fL (ref 80.0–100.0)
Monocytes Absolute: 0.6 10*3/uL (ref 0.1–1.0)
Monocytes Relative: 8 %
NEUTROS ABS: 4 10*3/uL (ref 1.7–7.7)
NEUTROS PCT: 60 %
NRBC: 0 % (ref 0.0–0.2)
Platelet Count: 186 10*3/uL (ref 150–400)
RBC: 4.16 MIL/uL (ref 3.87–5.11)
RDW: 14 % (ref 11.5–15.5)
WBC Count: 6.6 10*3/uL (ref 4.0–10.5)

## 2018-02-27 LAB — CMP (CANCER CENTER ONLY)
ALBUMIN: 3.8 g/dL (ref 3.5–5.0)
ALK PHOS: 91 U/L (ref 38–126)
ALT: 16 U/L (ref 0–44)
AST: 21 U/L (ref 15–41)
Anion gap: 8 (ref 5–15)
BUN: 23 mg/dL (ref 8–23)
CHLORIDE: 104 mmol/L (ref 98–111)
CO2: 31 mmol/L (ref 22–32)
Calcium: 9.5 mg/dL (ref 8.9–10.3)
Creatinine: 1.21 mg/dL — ABNORMAL HIGH (ref 0.44–1.00)
GFR, Est AFR Am: 51 mL/min — ABNORMAL LOW (ref >60)
GFR, Estimated: 44 mL/min — ABNORMAL LOW (ref >60)
Glucose, Bld: 79 mg/dL (ref 70–99)
POTASSIUM: 4.3 mmol/L (ref 3.5–5.1)
SODIUM: 143 mmol/L (ref 135–145)
Total Bilirubin: 0.4 mg/dL (ref 0.3–1.2)
Total Protein: 6.9 g/dL (ref 6.5–8.1)

## 2018-02-27 NOTE — Telephone Encounter (Signed)
Gave pt avs and calendar  °

## 2018-02-27 NOTE — Assessment & Plan Note (Signed)
Status post left mastectomy with axillary lymph node dissection November 2011 and triple negative disease with Ki-67 13% followed by immediate reconstruction status post adjuvant chemotherapy with AC followed by Taxol carbo develop neuropathy and myelosuppression  Surveillance:  1. Breast exam 10/11/2019is normal 2. Mammogram 03/05/2017: Benign  Shortness of breath to minimal exertion:   Return to clinic in 1 year for follow-up with survivorship

## 2018-02-27 NOTE — Progress Notes (Signed)
Patient Care Team: Laurey Morale, MD as PCP - General  DIAGNOSIS:  Encounter Diagnosis  Name Primary?  . Malignant neoplasm of upper-inner quadrant of left female breast, unspecified estrogen receptor status ([place])     SUMMARY OF ONCOLOGIC HISTORY:   Breast cancer of upper-inner quadrant of left female breast ([place])   03/30/2010 Surgery    Left Mastectomy: Multifocal LN Negative, ER/PR Neg, Her 2 Neg, Ki 67: 18%, Lef Breast reconstruction    04/25/2010 - 09/20/2010 Chemotherapy    AC X 4, Taxol carbo X 7, Taxol X 5     CHIEF COMPLIANT: Surveillance of breast cancer  INTERVAL HISTORY: Rebekah Moreno is a 72 year old with above-mentioned history of triple negative left breast cancer treated with mastectomy followed by adjuvant chemotherapy.  She is currently in surveillance.  She has moved to [facility] and comes here for follow-ups.  Denies any lumps or nodules but she has mild discomfort in the right breast.  Mammogram has not yet been done this year.  REVIEW OF SYSTEMS:   Constitutional: Denies fevers, chills or abnormal weight loss Eyes: Denies blurriness of vision Ears, nose, mouth, throat, and face: Denies mucositis or sore throat Respiratory: Denies cough, dyspnea or wheezes Cardiovascular: Denies palpitation, chest discomfort Gastrointestinal:  Denies nausea, heartburn or change in bowel habits Skin: Denies abnormal skin rashes Lymphatics: Denies new lymphadenopathy or easy bruising Neurological:Denies numbness, tingling or new weaknesses Behavioral/Psych: Mood is stable, no new changes  Extremities: No lower extremity edema Breast: Mild right breast discomfort All other systems were reviewed with the patient and are negative.  I have reviewed the past medical history, past surgical history, social history and family history with the patient and they are unchanged from previous note.  ALLERGIES:  is allergic to dust mite extract; mold extract [trichophyton  mentagrophyte]; other; and succinylcholine.  MEDICATIONS:  Current Outpatient Medications  Medication Sig Dispense Refill  . acyclovir (ZOVIRAX) 400 MG tablet TAKE 1 TABLET BY MOUTH EVERY 8 HOURS AS NEEDED FOR FEVER BLISTERS 60 tablet 3  . albuterol (PROVENTIL HFA;VENTOLIN HFA) 108 (90 Base) MCG/ACT inhaler Inhale 2 puffs into the lungs every 4 (four) hours as needed for wheezing or shortness of breath. 1 Inhaler 5  . ALPRAZolam (XANAX) 0.5 MG tablet TAKE 1 TABLET BY MOUTH 3 TIMES A DAY AS NEEDED 90 tablet 5  . celecoxib (CELEBREX) 200 MG capsule TAKE 1 CAPSULE BY MOUTH TWICE A DAY 180 capsule 3  . enalapril-hydrochlorothiazide (VASERETIC) 10-25 MG tablet TAKE 1 TABLET BY MOUTH EVERY DAY 90 tablet 3  . HYDROcodone-homatropine (HYDROMET) 5-1.5 MG/5ML syrup Take 5 mLs by mouth every 4 (four) hours as needed. 240 mL 0  . metoprolol succinate (TOPROL-XL) 25 MG 24 hr tablet TAKE 1 TABLET BY MOUTH EVERY DAY 90 tablet 0  . Multiple Vitamins-Minerals (CENTRUM ULTRA WOMENS) TABS Take 1 tablet by mouth daily.      Marland Kitchen NEXIUM 40 MG capsule TAKE 1 CAPSULE (40 MG TOTAL) BY MOUTH DAILY. 90 capsule 3  . potassium chloride (KLOR-CON 10) 10 MEQ tablet TAKE 1 TABLET (10 MEQ TOTAL) BY MOUTH DAILY. 90 tablet 3  . sertraline (ZOLOFT) 100 MG tablet Take 1 tablet (100 mg total) by mouth daily. 90 tablet 3  . simvastatin (ZOCOR) 40 MG tablet TAKE 1 TABLET BY MOUTH EVERY DAY IN THE EVENING 90 tablet 0  . valACYclovir (VALTREX) 1000 MG tablet Take 1 tablet (1,000 mg total) by mouth 3 (three) times daily. 30 tablet 0  No current facility-administered medications for this visit.     PHYSICAL EXAMINATION: ECOG PERFORMANCE STATUS: 1 - Symptomatic but completely ambulatory  Vitals:   02/27/18 1128  BP: 127/63  Pulse: (!) 59  Resp: 18  Temp: 98.2 F (36.8 C)  SpO2: 98%   Filed Weights   02/27/18 1128  Weight: 216 lb 1.6 oz (98 kg)    GENERAL:alert, no distress and comfortable SKIN: skin color, texture, turgor  are normal, no rashes or significant lesions EYES: normal, Conjunctiva are pink and non-injected, sclera clear OROPHARYNX:no exudate, no erythema and lips, buccal mucosa, and tongue normal  NECK: supple, thyroid normal size, non-tender, without nodularity LYMPH:  no palpable lymphadenopathy in the cervical, axillary or inguinal LUNGS: clear to auscultation and percussion with normal breathing effort HEART: regular rate & rhythm and no murmurs and no lower extremity edema ABDOMEN:abdomen soft, non-tender and normal bowel sounds MUSCULOSKELETAL:no cyanosis of digits and no clubbing  NEURO: alert & oriented x 3 with fluent speech, no focal motor/sensory deficits EXTREMITIES: No lower extremity edema BREAST: Left breast reconstructed no palpable lumps or nodules.  Right breast no lumps or nodules.. (exam performed in the presence of a chaperone)  LABORATORY DATA:  I have reviewed the data as listed CMP Latest Ref Rng & Units 02/27/2018 04/18/2017 02/28/2017  Glucose 70 - 99 mg/dL 79 120(H) 92  BUN 8 - 23 mg/dL 23 21 21.5  Creatinine 0.44 - 1.00 mg/dL 1.21(H) 1.03 1.0  Sodium 135 - 145 mmol/L 143 140 141  Potassium 3.5 - 5.1 mmol/L 4.3 3.9 4.0  Chloride 98 - 111 mmol/L 104 101 -  CO2 22 - 32 mmol/L '31 31 29  ' Calcium 8.9 - 10.3 mg/dL 9.5 9.8 9.1  Total Protein 6.5 - 8.1 g/dL 6.9 7.0 6.7  Total Bilirubin 0.3 - 1.2 mg/dL 0.4 0.5 0.46  Alkaline Phos 38 - 126 U/L 91 81 85  AST 15 - 41 U/L '21 20 26  ' ALT 0 - 44 U/L '16 16 22    ' Lab Results  Component Value Date   WBC 6.6 02/27/2018   HGB 10.8 (L) 02/27/2018   HCT 34.5 (L) 02/27/2018   MCV 82.9 02/27/2018   PLT 186 02/27/2018   NEUTROABS 4.0 02/27/2018    ASSESSMENT & PLAN:  Breast cancer of upper-inner quadrant of left female breast Status post left mastectomy with axillary lymph node dissection November 2011 and triple negative disease with Ki-67 13% followed by immediate reconstruction status post adjuvant chemotherapy with AC  followed by Taxol carbo develop neuropathy and myelosuppression  Surveillance:  1. Breast exam 02/27/2018: No palpable lumps or nodules in the right breast.  Left breast reconstructed. 2. Mammogram 03/05/2017: Benign  Shortness of breath to minimal exertion:  Patient is trying to lose weight and exercise regularly.  Blood work review: Patient has mild anemia and mild renal insufficiency.  I suspect the anemia is due to renal insufficiency. I instructed her to follow-up with her primary care physician regarding this. I printed a copy of her blood work and gave to her.  Return to clinic in 1 year for follow-up with survivorship    No orders of the defined types were placed in this encounter.  The patient has a good understanding of the overall plan. she agrees with it. she will call with any problems that may develop before the next visit here.   Harriette Ohara, MD 02/27/18

## 2018-03-30 DIAGNOSIS — N950 Postmenopausal bleeding: Secondary | ICD-10-CM | POA: Diagnosis not present

## 2018-03-30 DIAGNOSIS — N390 Urinary tract infection, site not specified: Secondary | ICD-10-CM | POA: Diagnosis not present

## 2018-04-03 ENCOUNTER — Ambulatory Visit
Admission: RE | Admit: 2018-04-03 | Discharge: 2018-04-03 | Disposition: A | Discharge status: Home / Self Care | Payer: Medicare HMO | Source: Ambulatory Visit | Attending: Hematology and Oncology | Admitting: Hematology and Oncology

## 2018-04-03 ENCOUNTER — Other Ambulatory Visit: Payer: Self-pay | Admitting: Internal Medicine

## 2018-04-03 ENCOUNTER — Ambulatory Visit
Admission: RE | Admit: 2018-04-03 | Discharge: 2018-04-03 | Disposition: A | Discharge status: Home / Self Care | Payer: Medicare HMO | Source: Ambulatory Visit | Attending: Internal Medicine | Admitting: Internal Medicine

## 2018-04-03 DIAGNOSIS — C50212 Malignant neoplasm of upper-inner quadrant of left female breast: Secondary | ICD-10-CM

## 2018-04-03 DIAGNOSIS — R0602 Shortness of breath: Secondary | ICD-10-CM

## 2018-04-03 DIAGNOSIS — R079 Chest pain, unspecified: Secondary | ICD-10-CM

## 2018-04-03 DIAGNOSIS — Z1231 Encounter for screening mammogram for malignant neoplasm of breast: Secondary | ICD-10-CM | POA: Diagnosis not present

## 2018-04-07 ENCOUNTER — Other Ambulatory Visit: Payer: Self-pay | Admitting: Family Medicine

## 2018-04-08 DIAGNOSIS — N950 Postmenopausal bleeding: Secondary | ICD-10-CM | POA: Diagnosis not present

## 2018-04-08 DIAGNOSIS — N840 Polyp of corpus uteri: Secondary | ICD-10-CM | POA: Diagnosis not present

## 2018-04-10 NOTE — H&P (Signed)
NAME: Rebekah Moreno, Vohs MEDICAL RECORD [id number] ACCOUNT [id number] DATE OF BIRTH:November 24, 1945 FACILITY: [facility] LOCATION:  PHYSICIAN:Rael Yo Garry Heater, MD  HISTORY AND PHYSICAL  DATE OF ADMISSION:  04/29/2018  CHIEF COMPLAINT:  Postmenopausal bleeding.  HISTORY OF PRESENT ILLNESS:  A 72 year old postmenopausal patient who has been living in [facility] recently.  Is currently on Prolia twice a year for osteoporosis.  Presented on 04/07/2018 with some brownish discharge that she noted.  Evaluation in our  office, SHG showed a 3.7 x 3.2 polypoid mass.  D and C hysteroscopy with MyoSure was recommended.  This procedure including the specific risks regarding bleeding, infection, other complications that may require additional surgery.  All discussed with  her.  We plan to do this in the outpatient surgery.  ALLERGIES:  MOLD, DUST.  MEDICATIONS:  Zoloft, Celebrex p.r.n., metoprolol, Zocor, Enalapril.  REVIEW OF SYSTEMS PAST MEDICAL HISTORY:  Headache, asthma, UTI, osteoporosis, arthritis, hypertension and breast cancer.  PAST SURGICAL HISTORY:  She has had a left mastectomy.  Other surgery, she has had a left knee replacement.  FAMILY HISTORY:  Denies tobacco or drug use.  SOCIAL HISTORY:  She does drink alcohol occasionally.  PHYSICAL EXAMINATION: VITAL SIGNS:  Temperature 98.2, blood pressure 100/70, weight was 209. HEENT:  Unremarkable. NECK:  Supple, without masses. LUNGS:  Clear. HEART:  Regular rate and rhythm without murmurs, rubs or gallops noted. BREASTS:  She has had a prior unilateral mastectomy remainder of her breast exam was normal. ABDOMEN:  Soft, flat, nontender. PELVIC:  Vulva, vagina, cervix normal.  Uterus was upper limit normal size, midposition.  Adnexa negative.  Last Pap 04/06/2018 was negative. NEUROLOGIC:  Unremarkable.  IMPRESSION:  Postmenopausal bleeding, large endometrial polyp.  PLAN:  D and C, hysteroscopy with resection of polyp.  Procedure and  risks reviewed as above.     AN/NUANCE  D:04/09/2018 T:04/10/2018 JOB:[id number]

## 2018-04-14 DIAGNOSIS — Z6835 Body mass index (BMI) 35.0-35.9, adult: Secondary | ICD-10-CM | POA: Diagnosis not present

## 2018-04-14 DIAGNOSIS — Z01419 Encounter for gynecological examination (general) (routine) without abnormal findings: Secondary | ICD-10-CM | POA: Diagnosis not present

## 2018-04-22 ENCOUNTER — Encounter (HOSPITAL_BASED_OUTPATIENT_CLINIC_OR_DEPARTMENT_OTHER): Payer: Self-pay | Admitting: *Deleted

## 2018-04-22 ENCOUNTER — Other Ambulatory Visit: Payer: Self-pay

## 2018-04-22 NOTE — Progress Notes (Signed)
Spoke w/ pt via phone for pre-op interview.  Npo after mn.  Arrive at [employer].  Needs istat 8 and ekg.  Will take zoloft, metoprolol, nexium, and xanax if needed.  Asked pt to bring rescue inhaler.

## 2018-04-24 NOTE — Telephone Encounter (Signed)
Pt calling to check status. Pt states that she is completley out. Please advise [phone]

## 2018-04-24 NOTE — Telephone Encounter (Signed)
Pharmacy calling to check status. Please advise

## 2018-04-27 NOTE — Telephone Encounter (Signed)
Pharmacy checking on the status of medication request, please advise  [id number] [place], [place] - [address] [address], [place] [phone] (Phone) [phone] (Fax)

## 2018-04-28 ENCOUNTER — Encounter (HOSPITAL_BASED_OUTPATIENT_CLINIC_OR_DEPARTMENT_OTHER): Payer: Self-pay

## 2018-04-28 ENCOUNTER — Telehealth: Payer: Self-pay | Admitting: Family Medicine

## 2018-04-28 NOTE — Telephone Encounter (Signed)
Copied from [place] [id number]. Topic: Quick Communication - Rx Refill/Question >> Apr 28, 2018  4:08 PM Reyne Dumas L wrote: Medication:  ALPRAZolam Duanne Moron) 0.5 MG tablet  Has the patient contacted their pharmacy? Yes - states shortage on the 0.5MG  - pt asking if PCP can RX the 1MG  and have her cut in half so she can get medication filled. (Agent: If no, request that the patient contact the pharmacy for the refill.) (Agent: If yes, when and what did the pharmacy advise?)  Preferred Pharmacy (with phone number or street name): [id number] - [place], [place] - [address] [address], [place] [phone] (Phone) [phone] (Fax)  Agent: Please be advised that RX refills may take up to 3 business days. We ask that you follow-up with your pharmacy.

## 2018-04-28 NOTE — Progress Notes (Signed)
Spoke with Nira Conn in Dr. Delanna Ahmadi office to ask for order for surgical procedure to be revised so there are no  Abbreviations.

## 2018-04-28 NOTE — Anesthesia Preprocedure Evaluation (Addendum)
Anesthesia Evaluation  Patient identified by MRN, date of birth, ID band Patient awake    Reviewed: Allergy & Precautions, NPO status , Patient's Chart, lab work & pertinent test results  Airway Mallampati: II  TM Distance: >3 FB Neck ROM: Full    Dental no notable dental hx. (+) Dental Advisory Given   Pulmonary asthma ,    Pulmonary exam normal        Cardiovascular hypertension, Normal cardiovascular exam  Study Conclusions  - Left ventricle: Prominant epicardial fat tissue. The cavity size   was normal. Wall thickness was increased in a pattern of mild   LVH. Systolic function was normal. The estimated ejection   fraction was in the range of 60% to 65%. Wall motion was normal;   Neuro/Psych PSYCHIATRIC DISORDERS Anxiety Depression negative neurological ROS     GI/Hepatic Neg liver ROS, hiatal hernia, GERD  ,  Endo/Other  Morbid obesity  Renal/GU      Musculoskeletal  (+) Arthritis , Osteoarthritis,    Abdominal   Peds  Hematology negative hematology ROS (+)   Anesthesia Other Findings Day of surgery medications reviewed with the patient.  Reproductive/Obstetrics                            Anesthesia Physical Anesthesia Plan  ASA: II  Anesthesia Plan: General   Post-op Pain Management:    Induction: Intravenous  PONV Risk Score and Plan: 4 or greater and Ondansetron, Dexamethasone, Diphenhydramine and Treatment may vary due to age or medical condition  Airway Management Planned: LMA  Additional Equipment:   Intra-op Plan:   Post-operative Plan: Extubation in OR  Informed Consent: I have reviewed the patients History and Physical, chart, labs and discussed the procedure including the risks, benefits and alternatives for the proposed anesthesia with the patient or authorized representative who has indicated his/her understanding and acceptance.   Dental advisory  given  Plan Discussed with: CRNA and Anesthesiologist  Anesthesia Plan Comments:        Anesthesia Quick Evaluation

## 2018-04-29 ENCOUNTER — Ambulatory Visit (HOSPITAL_BASED_OUTPATIENT_CLINIC_OR_DEPARTMENT_OTHER)
Admission: RE | Admit: 2018-04-29 | Discharge: 2018-04-29 | Disposition: A | Discharge status: Home / Self Care | Payer: Medicare HMO | Source: Ambulatory Visit | Attending: Obstetrics and Gynecology | Admitting: Obstetrics and Gynecology

## 2018-04-29 ENCOUNTER — Ambulatory Visit (HOSPITAL_BASED_OUTPATIENT_CLINIC_OR_DEPARTMENT_OTHER): Payer: Medicare HMO | Admitting: Anesthesiology

## 2018-04-29 ENCOUNTER — Encounter (HOSPITAL_BASED_OUTPATIENT_CLINIC_OR_DEPARTMENT_OTHER)
Admission: RE | Disposition: A | Discharge status: Home / Self Care | Payer: Self-pay | Source: Ambulatory Visit | Attending: Obstetrics and Gynecology

## 2018-04-29 ENCOUNTER — Other Ambulatory Visit: Payer: Self-pay

## 2018-04-29 ENCOUNTER — Encounter (HOSPITAL_BASED_OUTPATIENT_CLINIC_OR_DEPARTMENT_OTHER): Payer: Self-pay | Admitting: *Deleted

## 2018-04-29 DIAGNOSIS — R69 Illness, unspecified: Secondary | ICD-10-CM | POA: Diagnosis not present

## 2018-04-29 DIAGNOSIS — C541 Malignant neoplasm of endometrium: Secondary | ICD-10-CM | POA: Insufficient documentation

## 2018-04-29 DIAGNOSIS — Z9012 Acquired absence of left breast and nipple: Secondary | ICD-10-CM | POA: Insufficient documentation

## 2018-04-29 DIAGNOSIS — E785 Hyperlipidemia, unspecified: Secondary | ICD-10-CM | POA: Diagnosis not present

## 2018-04-29 DIAGNOSIS — N950 Postmenopausal bleeding: Secondary | ICD-10-CM | POA: Insufficient documentation

## 2018-04-29 DIAGNOSIS — F329 Major depressive disorder, single episode, unspecified: Secondary | ICD-10-CM | POA: Insufficient documentation

## 2018-04-29 DIAGNOSIS — F419 Anxiety disorder, unspecified: Secondary | ICD-10-CM | POA: Diagnosis not present

## 2018-04-29 DIAGNOSIS — M810 Age-related osteoporosis without current pathological fracture: Secondary | ICD-10-CM | POA: Diagnosis not present

## 2018-04-29 DIAGNOSIS — I10 Essential (primary) hypertension: Secondary | ICD-10-CM | POA: Insufficient documentation

## 2018-04-29 DIAGNOSIS — Z853 Personal history of malignant neoplasm of breast: Secondary | ICD-10-CM | POA: Insufficient documentation

## 2018-04-29 DIAGNOSIS — N840 Polyp of corpus uteri: Secondary | ICD-10-CM | POA: Diagnosis not present

## 2018-04-29 DIAGNOSIS — Z6834 Body mass index (BMI) 34.0-34.9, adult: Secondary | ICD-10-CM | POA: Insufficient documentation

## 2018-04-29 DIAGNOSIS — Z79899 Other long term (current) drug therapy: Secondary | ICD-10-CM | POA: Diagnosis not present

## 2018-04-29 DIAGNOSIS — J45909 Unspecified asthma, uncomplicated: Secondary | ICD-10-CM | POA: Diagnosis not present

## 2018-04-29 HISTORY — DX: Malignant neoplasm of upper-inner quadrant of left female breast: Z17.1

## 2018-04-29 HISTORY — DX: Personal history of other diseases of the digestive system: Z87.19

## 2018-04-29 HISTORY — DX: Unspecified osteoarthritis, unspecified site: M19.90

## 2018-04-29 HISTORY — DX: Dyspnea, unspecified: R06.00

## 2018-04-29 HISTORY — DX: Malignant neoplasm of upper-inner quadrant of left female breast: C50.212

## 2018-04-29 HISTORY — DX: Adverse effect of unspecified anesthetic, initial encounter: T41.45XA

## 2018-04-29 HISTORY — DX: Personal history of colonic polyps: Z86.010

## 2018-04-29 HISTORY — DX: Other forms of dyspnea: R06.09

## 2018-04-29 HISTORY — PX: DILATATION & CURETTAGE/HYSTEROSCOPY WITH MYOSURE: SHX6511

## 2018-04-29 HISTORY — DX: Anemia, unspecified: D64.9

## 2018-04-29 HISTORY — DX: Postmenopausal bleeding: N95.0

## 2018-04-29 HISTORY — DX: Other allergy status, other than to drugs and biological substances: Z91.09

## 2018-04-29 HISTORY — DX: Diaphragmatic hernia without obstruction or gangrene: K44.9

## 2018-04-29 HISTORY — DX: Chronic kidney disease, stage 2 (mild): N18.2

## 2018-04-29 HISTORY — DX: Herpesviral vesicular dermatitis: B00.1

## 2018-04-29 HISTORY — DX: Other complications of anesthesia, initial encounter: T88.59XA

## 2018-04-29 HISTORY — DX: Personal history of adenomatous and serrated colon polyps: Z86.0101

## 2018-04-29 LAB — POCT I-STAT, CHEM 8
BUN: 19 mg/dL (ref 8–23)
Calcium, Ion: 1.21 mmol/L (ref 1.15–1.40)
Chloride: 102 mmol/L (ref 98–111)
Creatinine, Ser: 1 mg/dL (ref 0.44–1.00)
Glucose, Bld: 121 mg/dL — ABNORMAL HIGH (ref 70–99)
HCT: 33 % — ABNORMAL LOW (ref 36.0–46.0)
Hemoglobin: 11.2 g/dL — ABNORMAL LOW (ref 12.0–15.0)
Potassium: 3.6 mmol/L (ref 3.5–5.1)
Sodium: 140 mmol/L (ref 135–145)
TCO2: 30 mmol/L (ref 22–32)

## 2018-04-29 LAB — TYPE AND SCREEN
ABO/RH(D): O POS
Antibody Screen: NEGATIVE

## 2018-04-29 SURGERY — DILATATION & CURETTAGE/HYSTEROSCOPY WITH MYOSURE
Anesthesia: General | Site: Vagina

## 2018-04-29 MED ORDER — ONDANSETRON HCL 4 MG/2ML IJ SOLN
INTRAMUSCULAR | Status: AC
  Filled 2018-04-29: qty 2

## 2018-04-29 MED ORDER — DEXAMETHASONE SODIUM PHOSPHATE 10 MG/ML IJ SOLN
INTRAMUSCULAR | Status: DC | PRN
  Administered 2018-04-29: 4 mg via INTRAVENOUS

## 2018-04-29 MED ORDER — LIDOCAINE HCL (CARDIAC) PF 100 MG/5ML IV SOSY
PREFILLED_SYRINGE | INTRAVENOUS | Status: DC | PRN
  Administered 2018-04-29: 60 mg via INTRAVENOUS

## 2018-04-29 MED ORDER — PROPOFOL 10 MG/ML IV BOLUS
INTRAVENOUS | Status: DC | PRN
  Administered 2018-04-29: 150 mg via INTRAVENOUS

## 2018-04-29 MED ORDER — HYDROMORPHONE HCL 1 MG/ML IJ SOLN
0.2500 mg | INTRAMUSCULAR | Status: DC | PRN
  Filled 2018-04-29: qty 0.5

## 2018-04-29 MED ORDER — LACTATED RINGERS IV SOLN
INTRAVENOUS | Status: DC
  Administered 2018-04-29: 1000 mL via INTRAVENOUS
  Administered 2018-04-29: 07:00:00 via INTRAVENOUS
  Filled 2018-04-29: qty 1000

## 2018-04-29 MED ORDER — MIDAZOLAM HCL 5 MG/5ML IJ SOLN
INTRAMUSCULAR | Status: DC | PRN
  Administered 2018-04-29: 2 mg via INTRAVENOUS

## 2018-04-29 MED ORDER — LIDOCAINE HCL 1 % IJ SOLN
INTRAMUSCULAR | Status: DC | PRN
  Administered 2018-04-29: 7 mL

## 2018-04-29 MED ORDER — ONDANSETRON HCL 4 MG/2ML IJ SOLN
INTRAMUSCULAR | Status: DC | PRN
  Administered 2018-04-29: 4 mg via INTRAVENOUS

## 2018-04-29 MED ORDER — KETOROLAC TROMETHAMINE 30 MG/ML IJ SOLN
INTRAMUSCULAR | Status: DC | PRN
  Administered 2018-04-29: 15 mg via INTRAVENOUS

## 2018-04-29 MED ORDER — PROMETHAZINE HCL 25 MG/ML IJ SOLN
6.2500 mg | INTRAMUSCULAR | Status: DC | PRN
  Filled 2018-04-29: qty 1

## 2018-04-29 MED ORDER — FENTANYL CITRATE (PF) 100 MCG/2ML IJ SOLN
INTRAMUSCULAR | Status: DC | PRN
  Administered 2018-04-29 (×2): 50 ug via INTRAVENOUS

## 2018-04-29 MED ORDER — SODIUM CHLORIDE 0.9 % IV SOLN
INTRAVENOUS | Status: AC
  Filled 2018-04-29: qty 2

## 2018-04-29 MED ORDER — KETOROLAC TROMETHAMINE 30 MG/ML IJ SOLN
INTRAMUSCULAR | Status: AC
  Filled 2018-04-29: qty 1

## 2018-04-29 MED ORDER — SODIUM CHLORIDE 0.9 % IR SOLN
Status: DC | PRN
  Administered 2018-04-29: 3000 mL

## 2018-04-29 MED ORDER — LIDOCAINE 2% (20 MG/ML) 5 ML SYRINGE
INTRAMUSCULAR | Status: AC
  Filled 2018-04-29: qty 5

## 2018-04-29 MED ORDER — PROPOFOL 10 MG/ML IV BOLUS
INTRAVENOUS | Status: AC
  Filled 2018-04-29: qty 40

## 2018-04-29 MED ORDER — FENTANYL CITRATE (PF) 100 MCG/2ML IJ SOLN
INTRAMUSCULAR | Status: AC
  Filled 2018-04-29: qty 2

## 2018-04-29 MED ORDER — DEXAMETHASONE SODIUM PHOSPHATE 10 MG/ML IJ SOLN
INTRAMUSCULAR | Status: AC
  Filled 2018-04-29: qty 1

## 2018-04-29 MED ORDER — SODIUM CHLORIDE 0.9 % IV SOLN
2.0000 g | INTRAVENOUS | Status: AC
  Administered 2018-04-29: 2 g via INTRAVENOUS
  Filled 2018-04-29: qty 2

## 2018-04-29 MED ORDER — MIDAZOLAM HCL 2 MG/2ML IJ SOLN
INTRAMUSCULAR | Status: AC
  Filled 2018-04-29: qty 2

## 2018-04-29 SURGICAL SUPPLY — 29 items
CANISTER SUCT 3000ML PPV (MISCELLANEOUS) ×2 IMPLANT
CATH ROBINSON RED A/P 16FR (CATHETERS) ×2 IMPLANT
CLOTH BEACON ORANGE TIMEOUT ST (SAFETY) ×2 IMPLANT
COUNTER NEEDLE 1200 MAGNETIC (NEEDLE) ×2 IMPLANT
COVER WAND RF STERILE (DRAPES) ×2 IMPLANT
DEVICE MYOSURE LITE (MISCELLANEOUS) ×1 IMPLANT
DEVICE MYOSURE REACH (MISCELLANEOUS) IMPLANT
DILATOR CANAL MILEX (MISCELLANEOUS) IMPLANT
GAUZE 4X4 16PLY RFD (DISPOSABLE) ×2 IMPLANT
GLOVE BIO SURGEON STRL SZ7 (GLOVE) ×2 IMPLANT
GLOVE BIO SURGEON STRL SZ8 (GLOVE) ×1 IMPLANT
GLOVE BIOGEL PI IND STRL 7.0 (GLOVE) ×1 IMPLANT
GLOVE BIOGEL PI IND STRL 8 (GLOVE) IMPLANT
GLOVE BIOGEL PI INDICATOR 7.0 (GLOVE) ×1
GLOVE BIOGEL PI INDICATOR 8 (GLOVE) ×1
GOWN STRL REUS W/ TWL XL LVL3 (GOWN DISPOSABLE) IMPLANT
GOWN STRL REUS W/TWL LRG LVL3 (GOWN DISPOSABLE) ×4 IMPLANT
GOWN STRL REUS W/TWL XL LVL3 (GOWN DISPOSABLE) ×2
IV NS IRRIG 3000ML ARTHROMATIC (IV SOLUTION) ×4 IMPLANT
KIT PROCEDURE FLUENT (KITS) ×2 IMPLANT
KIT TURNOVER CYSTO (KITS) ×2 IMPLANT
MYOSURE XL FIBROID (MISCELLANEOUS)
PACK VAGINAL MINOR WOMEN LF (CUSTOM PROCEDURE TRAY) ×2 IMPLANT
PAD OB MATERNITY 4.3X12.25 (PERSONAL CARE ITEMS) ×2 IMPLANT
PAD PREP 24X48 CUFFED NSTRL (MISCELLANEOUS) ×2 IMPLANT
SEAL ROD LENS SCOPE MYOSURE (ABLATOR) ×2 IMPLANT
SYSTEM TISS REMOVAL MYOSURE XL (MISCELLANEOUS) IMPLANT
TOWEL OR 17X24 6PK STRL BLUE (TOWEL DISPOSABLE) ×4 IMPLANT
WATER STERILE IRR 500ML POUR (IV SOLUTION) ×2 IMPLANT

## 2018-04-29 NOTE — Progress Notes (Signed)
Dr. Molli Posey called to clarify medications for patient to take at home.  He stated that she can take all of her usual medications, and not to discontinue any of them. Patient informed of this and written on her discharge instructions.

## 2018-04-29 NOTE — Anesthesia Procedure Notes (Signed)
Procedure Name: LMA Insertion Date/Time: 04/29/2018 8:24 AM Performed by: Jonna Munro, CRNA Pre-anesthesia Checklist: Patient identified, Emergency Drugs available, Suction available, Patient being monitored and Timeout performed Patient Re-evaluated:Patient Re-evaluated prior to induction Oxygen Delivery Method: Circle system utilized Preoxygenation: Pre-oxygenation with 100% oxygen Induction Type: IV induction LMA: LMA inserted LMA Size: 4.0 Number of attempts: 1 Placement Confirmation: positive ETCO2 and breath sounds checked- equal and bilateral Dental Injury: Teeth and Oropharynx as per pre-operative assessment

## 2018-04-29 NOTE — Transfer of Care (Signed)
Immediate Anesthesia Transfer of Care Note  Patient: Rebekah Moreno  Procedure(s) Performed: DILATATION & CURETTAGE/HYSTEROSCOPY WITH MYOSURE (N/A Vagina )  Patient Location: PACU  Anesthesia Type:General  Level of Consciousness: awake, alert  and oriented  Airway & Oxygen Therapy: Patient Spontanous Breathing and Patient connected to nasal cannula oxygen  Post-op Assessment: Report given to RN and Post -op Vital signs reviewed and stable  Post vital signs: Reviewed and stable  Last Vitals:  Vitals Value Taken Time  BP    Temp    Pulse    Resp    SpO2      Last Pain:  Vitals:   04/29/18 0632  TempSrc:   PainSc: 0-No pain      Patients Stated Pain Goal: 5 (19/16/60 6004)  Complications: No apparent anesthesia complications

## 2018-04-29 NOTE — Progress Notes (Signed)
The patient was re-examined with no change in status 

## 2018-04-29 NOTE — Op Note (Signed)
Preoperative diagnosis: Postmenopausal bleeding, endometrial polyp  Postoperative diagnosis: Same  Procedure: D&C, hysteroscopy, resection of large endometrial polyp with MyoSure  Surgeon: Matthew Saras  Anesthesia: General  EBL: 10 cc  Procedure findings the patient was taken to the operating room after an adequate level of general anesthesia was obtained with the patient's legs in stirrups the perineum and vagina were prepped and draped she voided just before going to the OR catheterization was not performed.  Appropriate timeouts were taken at that point.  Exam under anesthesia revealed that the uterus was mid position normal size mobile adnexa negative.  Graves speculum was positioned, cervix grasped with a tenaculum paracervical block was then created by infiltrating at 3 at 9:00 submucosally, 5 cc of 1% Xylocaine at each side after negative aspiration.  The uterus then sounded to 8 cm progressively dilated to 25 Pratt dilator, the continuous-flow hysteroscope was then inserted large a benign-appearing endometrial polyp from the left fundus was seen all the way down to the internal office.  The MyoSure resector was then positioned and starting at the more distal end, the soft tissue polyp was resected completely.  This certainly had a benign appearance there were no other abnormalities minimal bleeding once the resection was completed this was all submitted to pathology she tolerated this well, went to recovery room in good condition.  Dictated with Dragon Medical 1  Margarette Asal MD

## 2018-04-29 NOTE — Anesthesia Postprocedure Evaluation (Signed)
Anesthesia Post Note  Patient: Rebekah Moreno  Procedure(s) Performed: DILATATION & CURETTAGE/HYSTEROSCOPY WITH MYOSURE (N/A Vagina )     Patient location during evaluation: PACU Anesthesia Type: General Level of consciousness: sedated Pain management: pain level controlled Vital Signs Assessment: post-procedure vital signs reviewed and stable Respiratory status: spontaneous breathing and respiratory function stable Cardiovascular status: stable Postop Assessment: no apparent nausea or vomiting Anesthetic complications: no    Last Vitals:  Vitals:   04/29/18 0915 04/29/18 0930  BP: (!) 156/68 (!) 153/73  Pulse: 65 70  Resp: 16 15  Temp:    SpO2: 98% 95%    Last Pain:  Vitals:   04/29/18 0930  TempSrc:   PainSc: 0-No pain                 Dezmon Conover DANIEL

## 2018-04-29 NOTE — Discharge Instructions (Signed)

## 2018-04-30 MED ORDER — ALPRAZOLAM 1 MG PO TABS: ORAL_TABLET | ORAL | 5 refills | Status: DC

## 2018-04-30 NOTE — Telephone Encounter (Signed)
Xanax rx has been called in for the 1 mg tablets.

## 2018-04-30 NOTE — Telephone Encounter (Signed)
Dr. Sarajane Jews please advise if ok to change the xanax to the 1 mg due to the 0.5 mg being on back order.

## 2018-04-30 NOTE — Telephone Encounter (Signed)
OK, change the Xanax to 1 mg to take 1/2 tab TID. Call in #[phone] with 5 rf

## 2018-05-01 ENCOUNTER — Other Ambulatory Visit: Payer: Self-pay | Admitting: Family Medicine

## 2018-05-01 ENCOUNTER — Encounter (HOSPITAL_BASED_OUTPATIENT_CLINIC_OR_DEPARTMENT_OTHER): Payer: Self-pay | Admitting: Obstetrics and Gynecology

## 2018-05-01 MED ORDER — ALPRAZOLAM 1 MG PO TABS: ORAL_TABLET | ORAL | 5 refills | Status: AC

## 2018-05-04 ENCOUNTER — Encounter: Payer: Self-pay | Admitting: Family Medicine

## 2018-05-04 ENCOUNTER — Ambulatory Visit (INDEPENDENT_AMBULATORY_CARE_PROVIDER_SITE_OTHER): Payer: Medicare HMO | Admitting: Family Medicine

## 2018-05-04 VITALS — BP 124/60 | HR 69 | Temp 97.7°F | Ht 66.0 in | Wt 217.0 lb

## 2018-05-04 DIAGNOSIS — J4520 Mild intermittent asthma, uncomplicated: Secondary | ICD-10-CM | POA: Diagnosis not present

## 2018-05-04 DIAGNOSIS — M159 Polyosteoarthritis, unspecified: Secondary | ICD-10-CM

## 2018-05-04 DIAGNOSIS — M150 Primary generalized (osteo)arthritis: Secondary | ICD-10-CM

## 2018-05-04 DIAGNOSIS — K219 Gastro-esophageal reflux disease without esophagitis: Secondary | ICD-10-CM

## 2018-05-04 DIAGNOSIS — E785 Hyperlipidemia, unspecified: Secondary | ICD-10-CM | POA: Diagnosis not present

## 2018-05-04 DIAGNOSIS — I10 Essential (primary) hypertension: Secondary | ICD-10-CM

## 2018-05-04 DIAGNOSIS — R69 Illness, unspecified: Secondary | ICD-10-CM | POA: Diagnosis not present

## 2018-05-04 DIAGNOSIS — F320 Major depressive disorder, single episode, mild: Secondary | ICD-10-CM

## 2018-05-04 MED ORDER — ESOMEPRAZOLE MAGNESIUM 40 MG PO CPDR: 40.0000 mg | DELAYED_RELEASE_CAPSULE | Freq: Every day | ORAL | 3 refills | Status: DC

## 2018-05-04 MED ORDER — SIMVASTATIN 40 MG PO TABS: 40.0000 mg | ORAL_TABLET | Freq: Every day | ORAL | 3 refills | Status: AC

## 2018-05-04 MED ORDER — CEPHALEXIN 500 MG PO CAPS: 500.0000 mg | ORAL_CAPSULE | Freq: Three times a day (TID) | ORAL | 0 refills | Status: AC

## 2018-05-04 MED ORDER — METOPROLOL SUCCINATE ER 25 MG PO TB24: 25.0000 mg | ORAL_TABLET | Freq: Every day | ORAL | 3 refills | Status: AC

## 2018-05-04 MED ORDER — SERTRALINE HCL 100 MG PO TABS: 100.0000 mg | ORAL_TABLET | Freq: Every day | ORAL | 3 refills | Status: AC

## 2018-05-04 NOTE — Progress Notes (Signed)
   Subjective:    Patient ID: Rebekah Moreno, female    DOB: 19-Jun-1945, 72 y.o.   MRN: [id number]  HPI Here to follow up on issues. She feels well in general but she is understandably upset by her recent diagnosis of endometrial adenocarcinoma. She had a biopsy per Dr. Matthew Saras 04-29-18 and it revealed a Grade I lesion. shehas been referred to GYN Oncology at [facility]. Her BP has been stable. Her GERD is stable.    Review of Systems  Constitutional: Negative.   HENT: Negative.   Eyes: Negative.   Respiratory: Negative.   Cardiovascular: Negative.   Gastrointestinal: Negative.   Genitourinary: Negative for decreased urine volume, difficulty urinating, dyspareunia, dysuria, enuresis, flank pain, frequency, hematuria, pelvic pain and urgency.  Musculoskeletal: Negative.   Skin: Negative.   Neurological: Negative.   Psychiatric/Behavioral: Negative.        Objective:   Physical Exam Constitutional:      General: She is not in acute distress.    Appearance: She is well-developed.  HENT:     Head: Normocephalic and atraumatic.     Right Ear: External ear normal.     Left Ear: External ear normal.     Nose: Nose normal.     Mouth/Throat:     Pharynx: No oropharyngeal exudate.  Eyes:     General: No scleral icterus.    Conjunctiva/sclera: Conjunctivae normal.     Pupils: Pupils are equal, round, and reactive to light.  Neck:     Musculoskeletal: Normal range of motion and neck supple.     Thyroid: No thyromegaly.     Vascular: No JVD.  Cardiovascular:     Rate and Rhythm: Normal rate and regular rhythm.     Heart sounds: Normal heart sounds. No murmur. No friction rub. No gallop.   Pulmonary:     Effort: Pulmonary effort is normal. No respiratory distress.     Breath sounds: Normal breath sounds. No wheezing or rales.  Chest:     Chest wall: No tenderness.  Abdominal:     General: Bowel sounds are normal. There is no distension.     Palpations: Abdomen is soft.  There is no mass.     Tenderness: There is no abdominal tenderness. There is no guarding or rebound.  Musculoskeletal: Normal range of motion.        General: No tenderness.  Lymphadenopathy:     Cervical: No cervical adenopathy.  Skin:    General: Skin is warm and dry.     Findings: No erythema or rash.  Neurological:     Mental Status: She is alert and oriented to person, place, and time.     Cranial Nerves: No cranial nerve deficit.     Motor: No abnormal muscle tone.     Coordination: Coordination normal.     Deep Tendon Reflexes: Reflexes are normal and symmetric. Reflexes normal.  Psychiatric:        Behavior: Behavior normal.        Thought Content: Thought content normal.        Judgment: Judgment normal.           Assessment & Plan:  Her HTN and GERD and asthma are stable. Her depression had been doing well, at least until she got this recent diagnosis. We will set up a fasting lipid panel and a TSH soon. She ill see [facility] GYN Oncology as above. Alysia Penna, MD

## 2018-05-05 LAB — BASIC METABOLIC PANEL
BUN: 21 mg/dL (ref 6–23)
CALCIUM: 9.4 mg/dL (ref 8.4–10.5)
CO2: 31 mEq/L (ref 19–32)
Chloride: 103 mEq/L (ref 96–112)
Creatinine, Ser: 0.9 mg/dL (ref 0.40–1.20)
GFR: 65.3 mL/min (ref >60.00)
Glucose, Bld: 113 mg/dL — ABNORMAL HIGH (ref 70–99)
Potassium: 4 mEq/L (ref 3.5–5.1)
Sodium: 143 mEq/L (ref 135–145)

## 2018-05-05 LAB — LIPID PANEL
Cholesterol: 207 mg/dL — ABNORMAL HIGH (ref 0–200)
HDL: 50.5 mg/dL (ref >39.00)
LDL Cholesterol: 122 mg/dL — ABNORMAL HIGH (ref 0–99)
NonHDL: 156.4
Total CHOL/HDL Ratio: 4
Triglycerides: 173 mg/dL — ABNORMAL HIGH (ref 0.0–149.0)
VLDL: 34.6 mg/dL (ref 0.0–40.0)

## 2018-05-05 LAB — TSH: TSH: 1.52 u[IU]/mL (ref 0.35–4.50)

## 2018-05-07 DIAGNOSIS — N840 Polyp of corpus uteri: Secondary | ICD-10-CM | POA: Diagnosis not present

## 2018-05-07 DIAGNOSIS — C55 Malignant neoplasm of uterus, part unspecified: Secondary | ICD-10-CM | POA: Diagnosis not present

## 2018-05-08 ENCOUNTER — Encounter: Payer: Self-pay | Admitting: *Deleted

## 2018-05-11 DIAGNOSIS — C541 Malignant neoplasm of endometrium: Secondary | ICD-10-CM | POA: Diagnosis not present

## 2018-05-11 DIAGNOSIS — E669 Obesity, unspecified: Secondary | ICD-10-CM | POA: Diagnosis not present

## 2018-05-11 DIAGNOSIS — M810 Age-related osteoporosis without current pathological fracture: Secondary | ICD-10-CM | POA: Diagnosis not present

## 2018-05-11 DIAGNOSIS — N840 Polyp of corpus uteri: Secondary | ICD-10-CM | POA: Diagnosis not present

## 2018-05-11 DIAGNOSIS — M1990 Unspecified osteoarthritis, unspecified site: Secondary | ICD-10-CM | POA: Diagnosis not present

## 2018-05-11 DIAGNOSIS — J45909 Unspecified asthma, uncomplicated: Secondary | ICD-10-CM | POA: Diagnosis not present

## 2018-05-15 DIAGNOSIS — Z8249 Family history of ischemic heart disease and other diseases of the circulatory system: Secondary | ICD-10-CM | POA: Diagnosis not present

## 2018-05-15 DIAGNOSIS — M810 Age-related osteoporosis without current pathological fracture: Secondary | ICD-10-CM | POA: Diagnosis not present

## 2018-05-15 DIAGNOSIS — Z7951 Long term (current) use of inhaled steroids: Secondary | ICD-10-CM | POA: Diagnosis not present

## 2018-05-15 DIAGNOSIS — I348 Other nonrheumatic mitral valve disorders: Secondary | ICD-10-CM | POA: Diagnosis not present

## 2018-05-15 DIAGNOSIS — C541 Malignant neoplasm of endometrium: Secondary | ICD-10-CM | POA: Diagnosis not present

## 2018-05-15 DIAGNOSIS — R0609 Other forms of dyspnea: Secondary | ICD-10-CM | POA: Diagnosis not present

## 2018-05-15 DIAGNOSIS — R0602 Shortness of breath: Secondary | ICD-10-CM | POA: Diagnosis not present

## 2018-05-15 DIAGNOSIS — E669 Obesity, unspecified: Secondary | ICD-10-CM | POA: Diagnosis not present

## 2018-05-15 DIAGNOSIS — R0600 Dyspnea, unspecified: Secondary | ICD-10-CM | POA: Diagnosis not present

## 2018-05-15 DIAGNOSIS — Z01818 Encounter for other preprocedural examination: Secondary | ICD-10-CM | POA: Diagnosis not present

## 2018-05-15 DIAGNOSIS — Z853 Personal history of malignant neoplasm of breast: Secondary | ICD-10-CM | POA: Diagnosis not present

## 2018-05-15 DIAGNOSIS — J45909 Unspecified asthma, uncomplicated: Secondary | ICD-10-CM | POA: Diagnosis not present

## 2018-05-15 DIAGNOSIS — I10 Essential (primary) hypertension: Secondary | ICD-10-CM | POA: Diagnosis not present

## 2018-05-18 DIAGNOSIS — Z853 Personal history of malignant neoplasm of breast: Secondary | ICD-10-CM | POA: Diagnosis not present

## 2018-05-18 DIAGNOSIS — I10 Essential (primary) hypertension: Secondary | ICD-10-CM | POA: Diagnosis not present

## 2018-05-18 DIAGNOSIS — M810 Age-related osteoporosis without current pathological fracture: Secondary | ICD-10-CM | POA: Diagnosis not present

## 2018-05-18 DIAGNOSIS — E669 Obesity, unspecified: Secondary | ICD-10-CM | POA: Diagnosis not present

## 2018-05-18 DIAGNOSIS — Z9012 Acquired absence of left breast and nipple: Secondary | ICD-10-CM | POA: Diagnosis not present

## 2018-05-18 DIAGNOSIS — Z6835 Body mass index (BMI) 35.0-35.9, adult: Secondary | ICD-10-CM | POA: Diagnosis not present

## 2018-05-18 DIAGNOSIS — C55 Malignant neoplasm of uterus, part unspecified: Secondary | ICD-10-CM | POA: Diagnosis not present

## 2018-05-18 DIAGNOSIS — J45909 Unspecified asthma, uncomplicated: Secondary | ICD-10-CM | POA: Diagnosis not present

## 2018-05-18 DIAGNOSIS — Z79899 Other long term (current) drug therapy: Secondary | ICD-10-CM | POA: Diagnosis not present

## 2018-05-18 DIAGNOSIS — C541 Malignant neoplasm of endometrium: Secondary | ICD-10-CM | POA: Diagnosis not present

## 2018-05-18 DIAGNOSIS — N840 Polyp of corpus uteri: Secondary | ICD-10-CM | POA: Diagnosis not present

## 2018-05-20 DIAGNOSIS — N840 Polyp of corpus uteri: Secondary | ICD-10-CM | POA: Diagnosis not present

## 2018-05-20 DIAGNOSIS — Z9012 Acquired absence of left breast and nipple: Secondary | ICD-10-CM | POA: Diagnosis not present

## 2018-05-20 DIAGNOSIS — J45909 Unspecified asthma, uncomplicated: Secondary | ICD-10-CM | POA: Diagnosis not present

## 2018-05-20 DIAGNOSIS — Z6835 Body mass index (BMI) 35.0-35.9, adult: Secondary | ICD-10-CM | POA: Diagnosis not present

## 2018-05-20 DIAGNOSIS — E669 Obesity, unspecified: Secondary | ICD-10-CM | POA: Diagnosis not present

## 2018-05-20 DIAGNOSIS — Z79899 Other long term (current) drug therapy: Secondary | ICD-10-CM | POA: Diagnosis not present

## 2018-05-20 DIAGNOSIS — I10 Essential (primary) hypertension: Secondary | ICD-10-CM | POA: Diagnosis not present

## 2018-05-20 DIAGNOSIS — M810 Age-related osteoporosis without current pathological fracture: Secondary | ICD-10-CM | POA: Diagnosis not present

## 2018-05-20 DIAGNOSIS — Z853 Personal history of malignant neoplasm of breast: Secondary | ICD-10-CM | POA: Diagnosis not present

## 2018-05-20 DIAGNOSIS — C541 Malignant neoplasm of endometrium: Secondary | ICD-10-CM | POA: Diagnosis not present

## 2018-05-28 ENCOUNTER — Other Ambulatory Visit: Payer: Self-pay | Admitting: Family Medicine

## 2018-05-28 DIAGNOSIS — E669 Obesity, unspecified: Secondary | ICD-10-CM | POA: Diagnosis not present

## 2018-05-28 DIAGNOSIS — Z6835 Body mass index (BMI) 35.0-35.9, adult: Secondary | ICD-10-CM | POA: Diagnosis not present

## 2018-05-28 DIAGNOSIS — Z23 Encounter for immunization: Secondary | ICD-10-CM | POA: Diagnosis not present

## 2018-05-28 DIAGNOSIS — C50912 Malignant neoplasm of unspecified site of left female breast: Secondary | ICD-10-CM | POA: Diagnosis not present

## 2018-05-28 DIAGNOSIS — Z483 Aftercare following surgery for neoplasm: Secondary | ICD-10-CM | POA: Diagnosis not present

## 2018-05-28 DIAGNOSIS — C541 Malignant neoplasm of endometrium: Secondary | ICD-10-CM | POA: Diagnosis not present

## 2018-05-28 DIAGNOSIS — M810 Age-related osteoporosis without current pathological fracture: Secondary | ICD-10-CM | POA: Diagnosis not present

## 2018-05-28 DIAGNOSIS — M1990 Unspecified osteoarthritis, unspecified site: Secondary | ICD-10-CM | POA: Diagnosis not present

## 2018-05-28 DIAGNOSIS — M19041 Primary osteoarthritis, right hand: Secondary | ICD-10-CM | POA: Diagnosis not present

## 2018-05-28 DIAGNOSIS — I10 Essential (primary) hypertension: Secondary | ICD-10-CM | POA: Diagnosis not present

## 2018-05-28 DIAGNOSIS — M19042 Primary osteoarthritis, left hand: Secondary | ICD-10-CM | POA: Diagnosis not present

## 2018-05-28 DIAGNOSIS — K219 Gastro-esophageal reflux disease without esophagitis: Secondary | ICD-10-CM | POA: Diagnosis not present

## 2018-05-28 DIAGNOSIS — Z1211 Encounter for screening for malignant neoplasm of colon: Secondary | ICD-10-CM | POA: Diagnosis not present

## 2018-05-28 DIAGNOSIS — E785 Hyperlipidemia, unspecified: Secondary | ICD-10-CM | POA: Diagnosis not present

## 2018-05-28 DIAGNOSIS — J45909 Unspecified asthma, uncomplicated: Secondary | ICD-10-CM | POA: Diagnosis not present

## 2018-06-10 DIAGNOSIS — Z09 Encounter for follow-up examination after completed treatment for conditions other than malignant neoplasm: Secondary | ICD-10-CM | POA: Diagnosis not present

## 2018-06-10 DIAGNOSIS — C541 Malignant neoplasm of endometrium: Secondary | ICD-10-CM | POA: Diagnosis not present

## 2018-06-30 DIAGNOSIS — L738 Other specified follicular disorders: Secondary | ICD-10-CM | POA: Diagnosis not present

## 2018-06-30 DIAGNOSIS — D225 Melanocytic nevi of trunk: Secondary | ICD-10-CM | POA: Diagnosis not present

## 2018-06-30 DIAGNOSIS — D485 Neoplasm of uncertain behavior of skin: Secondary | ICD-10-CM | POA: Diagnosis not present

## 2018-06-30 DIAGNOSIS — D2272 Melanocytic nevi of left lower limb, including hip: Secondary | ICD-10-CM | POA: Diagnosis not present

## 2018-06-30 DIAGNOSIS — L812 Freckles: Secondary | ICD-10-CM | POA: Diagnosis not present

## 2018-06-30 DIAGNOSIS — D0361 Melanoma in situ of right upper limb, including shoulder: Secondary | ICD-10-CM | POA: Diagnosis not present

## 2018-06-30 DIAGNOSIS — L821 Other seborrheic keratosis: Secondary | ICD-10-CM | POA: Diagnosis not present

## 2018-06-30 DIAGNOSIS — D2271 Melanocytic nevi of right lower limb, including hip: Secondary | ICD-10-CM | POA: Diagnosis not present

## 2018-06-30 DIAGNOSIS — Z85828 Personal history of other malignant neoplasm of skin: Secondary | ICD-10-CM | POA: Diagnosis not present

## 2018-06-30 DIAGNOSIS — D1801 Hemangioma of skin and subcutaneous tissue: Secondary | ICD-10-CM | POA: Diagnosis not present

## 2018-07-07 DIAGNOSIS — D0361 Melanoma in situ of right upper limb, including shoulder: Secondary | ICD-10-CM | POA: Diagnosis not present

## 2018-07-07 DIAGNOSIS — Z85828 Personal history of other malignant neoplasm of skin: Secondary | ICD-10-CM | POA: Diagnosis not present

## 2018-07-13 ENCOUNTER — Other Ambulatory Visit: Payer: Self-pay | Admitting: Family Medicine

## 2018-09-14 ENCOUNTER — Other Ambulatory Visit: Payer: Self-pay | Admitting: Family Medicine

## 2018-10-27 DIAGNOSIS — I10 Essential (primary) hypertension: Secondary | ICD-10-CM | POA: Diagnosis not present

## 2018-10-27 DIAGNOSIS — K219 Gastro-esophageal reflux disease without esophagitis: Secondary | ICD-10-CM | POA: Diagnosis not present

## 2018-10-27 DIAGNOSIS — C541 Malignant neoplasm of endometrium: Secondary | ICD-10-CM | POA: Diagnosis not present

## 2018-10-27 DIAGNOSIS — Z1211 Encounter for screening for malignant neoplasm of colon: Secondary | ICD-10-CM | POA: Diagnosis not present

## 2018-10-27 DIAGNOSIS — E669 Obesity, unspecified: Secondary | ICD-10-CM | POA: Diagnosis not present

## 2018-10-27 DIAGNOSIS — J45909 Unspecified asthma, uncomplicated: Secondary | ICD-10-CM | POA: Diagnosis not present

## 2018-10-27 DIAGNOSIS — Z1159 Encounter for screening for other viral diseases: Secondary | ICD-10-CM | POA: Diagnosis not present

## 2018-10-27 DIAGNOSIS — M810 Age-related osteoporosis without current pathological fracture: Secondary | ICD-10-CM | POA: Diagnosis not present

## 2018-10-27 DIAGNOSIS — C50912 Malignant neoplasm of unspecified site of left female breast: Secondary | ICD-10-CM | POA: Diagnosis not present

## 2018-10-27 DIAGNOSIS — E785 Hyperlipidemia, unspecified: Secondary | ICD-10-CM | POA: Diagnosis not present

## 2018-10-27 DIAGNOSIS — D126 Benign neoplasm of colon, unspecified: Secondary | ICD-10-CM | POA: Diagnosis not present

## 2018-10-27 DIAGNOSIS — R635 Abnormal weight gain: Secondary | ICD-10-CM | POA: Diagnosis not present

## 2018-11-18 DIAGNOSIS — C541 Malignant neoplasm of endometrium: Secondary | ICD-10-CM | POA: Diagnosis not present

## 2018-11-19 ENCOUNTER — Other Ambulatory Visit: Payer: Self-pay | Admitting: Family Medicine

## 2018-12-24 ENCOUNTER — Encounter: Payer: Self-pay | Admitting: Gastroenterology

## 2019-01-17 DIAGNOSIS — R69 Illness, unspecified: Secondary | ICD-10-CM | POA: Diagnosis not present

## 2019-01-27 DIAGNOSIS — L821 Other seborrheic keratosis: Secondary | ICD-10-CM | POA: Diagnosis not present

## 2019-01-27 DIAGNOSIS — L720 Epidermal cyst: Secondary | ICD-10-CM | POA: Diagnosis not present

## 2019-01-27 DIAGNOSIS — L738 Other specified follicular disorders: Secondary | ICD-10-CM | POA: Diagnosis not present

## 2019-01-27 DIAGNOSIS — L814 Other melanin hyperpigmentation: Secondary | ICD-10-CM | POA: Diagnosis not present

## 2019-01-27 DIAGNOSIS — Z85820 Personal history of malignant melanoma of skin: Secondary | ICD-10-CM | POA: Diagnosis not present

## 2019-01-27 DIAGNOSIS — D1801 Hemangioma of skin and subcutaneous tissue: Secondary | ICD-10-CM | POA: Diagnosis not present

## 2019-01-27 DIAGNOSIS — D225 Melanocytic nevi of trunk: Secondary | ICD-10-CM | POA: Diagnosis not present

## 2019-01-27 DIAGNOSIS — Z85828 Personal history of other malignant neoplasm of skin: Secondary | ICD-10-CM | POA: Diagnosis not present

## 2019-02-01 ENCOUNTER — Telehealth: Payer: Self-pay | Admitting: Hematology and Oncology

## 2019-02-01 NOTE — Telephone Encounter (Signed)
Faxed medical records to [facility] @ [phone] from 7/18-present

## 2019-03-05 ENCOUNTER — Other Ambulatory Visit: Payer: Self-pay

## 2019-03-05 ENCOUNTER — Inpatient Hospital Stay: Payer: Medicare HMO | Attending: Hematology and Oncology | Admitting: Hematology and Oncology

## 2019-03-05 ENCOUNTER — Other Ambulatory Visit: Payer: Self-pay | Admitting: Genetic Counselor

## 2019-03-05 ENCOUNTER — Encounter: Payer: Self-pay | Admitting: Genetic Counselor

## 2019-03-05 ENCOUNTER — Ambulatory Visit (HOSPITAL_BASED_OUTPATIENT_CLINIC_OR_DEPARTMENT_OTHER): Payer: Medicare HMO | Admitting: Genetic Counselor

## 2019-03-05 ENCOUNTER — Inpatient Hospital Stay: Payer: Medicare HMO

## 2019-03-05 DIAGNOSIS — Z79899 Other long term (current) drug therapy: Secondary | ICD-10-CM | POA: Insufficient documentation

## 2019-03-05 DIAGNOSIS — Z86010 Personal history of colonic polyps: Secondary | ICD-10-CM

## 2019-03-05 DIAGNOSIS — Z85820 Personal history of malignant melanoma of skin: Secondary | ICD-10-CM | POA: Insufficient documentation

## 2019-03-05 DIAGNOSIS — C50212 Malignant neoplasm of upper-inner quadrant of left female breast: Secondary | ICD-10-CM | POA: Diagnosis not present

## 2019-03-05 DIAGNOSIS — Z8542 Personal history of malignant neoplasm of other parts of uterus: Secondary | ICD-10-CM | POA: Diagnosis not present

## 2019-03-05 DIAGNOSIS — Z171 Estrogen receptor negative status [ER-]: Secondary | ICD-10-CM

## 2019-03-05 DIAGNOSIS — Z9012 Acquired absence of left breast and nipple: Secondary | ICD-10-CM | POA: Diagnosis not present

## 2019-03-05 DIAGNOSIS — Z853 Personal history of malignant neoplasm of breast: Secondary | ICD-10-CM | POA: Insufficient documentation

## 2019-03-05 DIAGNOSIS — G629 Polyneuropathy, unspecified: Secondary | ICD-10-CM | POA: Insufficient documentation

## 2019-03-05 DIAGNOSIS — Z9221 Personal history of antineoplastic chemotherapy: Secondary | ICD-10-CM | POA: Diagnosis not present

## 2019-03-05 NOTE — Progress Notes (Signed)
Patient Care Team: Laurey Morale, MD as PCP - General  DIAGNOSIS:    ICD-10-CM   1. Malignant neoplasm of upper-inner quadrant of left breast in female, estrogen receptor negative ([place])  C50.212    Z17.1     SUMMARY OF ONCOLOGIC HISTORY: Oncology History  Breast cancer of upper-inner quadrant of left female breast ([place])  03/30/2010 Surgery   Left Mastectomy: Multifocal LN Negative, ER/PR Neg, Her 2 Neg, Ki 67: 18%, Lef Breast reconstruction   04/25/2010 - 09/20/2010 Chemotherapy   AC X 4, Taxol carbo X 7, Taxol X 5     CHIEF COMPLIANT: Surveillance of breast cancer  INTERVAL HISTORY: Rebekah Moreno is a 73 y.o. with above-mentioned history of triple negative left breast cancer treated with mastectomy followed by adjuvant chemotherapy. She is currently in surveillance. I last saw her a year ago. Mammogram of the right breast on 04/03/18 showed no evidence of malignancy. She presents to the clinic today for follow-up.  She has been applying CBD oil to her knee and hip and it has helped her significantly.  REVIEW OF SYSTEMS:   Constitutional: Denies fevers, chills or abnormal weight loss Eyes: Denies blurriness of vision Ears, nose, mouth, throat, and face: Denies mucositis or sore throat Respiratory: Denies cough, dyspnea or wheezes Cardiovascular: Denies palpitation, chest discomfort Gastrointestinal: Denies nausea, heartburn or change in bowel habits Skin: Denies abnormal skin rashes Lymphatics: Denies new lymphadenopathy or easy bruising Neurological: Denies numbness, tingling or new weaknesses Behavioral/Psych: Mood is stable, no new changes  Extremities: No lower extremity edema Breast: denies any pain or lumps or nodules in either breasts All other systems were reviewed with the patient and are negative.  I have reviewed the past medical history, past surgical history, social history and family history with the patient and they are unchanged from previous note.   ALLERGIES:  is allergic to succinylcholine.  MEDICATIONS:  Current Outpatient Medications  Medication Sig Dispense Refill  . acyclovir (ZOVIRAX) 400 MG tablet TAKE 1 TABLET BY MOUTH EVERY 8 HOURS AS NEEDED FOR FEVER BLISTERS (Patient taking differently: Take 400 mg by mouth as needed. TAKE 1 TABLET BY MOUTH EVERY 8 HOURS AS NEEDED FOR FEVER BLISTERS) 60 tablet 3  . albuterol (PROVENTIL HFA;VENTOLIN HFA) 108 (90 Base) MCG/ACT inhaler Inhale 2 puffs into the lungs every 4 (four) hours as needed for wheezing or shortness of breath. (Patient taking differently: Inhale 2 puffs into the lungs every 4 (four) hours as needed for wheezing or shortness of breath. ) 1 Inhaler 5  . ALPRAZolam (XANAX) 1 MG tablet Take 1/2 tablet by mouth three times daily as needed for anxiety 45 tablet 5  . celecoxib (CELEBREX) 200 MG capsule TAKE 1 CAPSULE BY MOUTH TWICE A DAY (Patient taking differently: Take 200 mg by mouth daily. ) 180 capsule 3  . enalapril-hydrochlorothiazide (VASERETIC) 10-25 MG tablet TAKE 1 TABLET BY MOUTH EVERY DAY 90 tablet 3  . ibuprofen (ADVIL,MOTRIN) 200 MG tablet Take 200 mg by mouth every 6 (six) hours as needed.    . metoprolol succinate (TOPROL-XL) 25 MG 24 hr tablet Take 1 tablet (25 mg total) by mouth daily. 90 tablet 3  . Multiple Vitamins-Minerals (THERA-M) TABS Take by mouth.    Marland Kitchen NEXIUM 40 MG capsule TAKE 1 CAPSULE BY MOUTH EVERY DAY 90 capsule 3  . sertraline (ZOLOFT) 100 MG tablet Take 1 tablet (100 mg total) by mouth at bedtime. 90 tablet 3  . simvastatin (ZOCOR) 40 MG tablet Take  1 tablet (40 mg total) by mouth daily at 6 PM. 90 tablet 3   No current facility-administered medications for this visit.     PHYSICAL EXAMINATION: ECOG PERFORMANCE STATUS: 1 - Symptomatic but completely ambulatory  Vitals:   03/05/19 1159  BP: (!) 119/57  Pulse: 73  Resp: 18  Temp: 97.7 F (36.5 C)  SpO2: 99%   Filed Weights   03/05/19 1159  Weight: 216 lb 8 oz (98.2 kg)    GENERAL:  alert, no distress and comfortable SKIN: skin color, texture, turgor are normal, no rashes or significant lesions EYES: normal, Conjunctiva are pink and non-injected, sclera clear OROPHARYNX: no exudate, no erythema and lips, buccal mucosa, and tongue normal  NECK: supple, thyroid normal size, non-tender, without nodularity LYMPH: no palpable lymphadenopathy in the cervical, axillary or inguinal LUNGS: clear to auscultation and percussion with normal breathing effort HEART: regular rate & rhythm and no murmurs and no lower extremity edema ABDOMEN: abdomen soft, non-tender and normal bowel sounds MUSCULOSKELETAL: no cyanosis of digits and no clubbing  NEURO: alert & oriented x 3 with fluent speech, no focal motor/sensory deficits EXTREMITIES: No lower extremity edema BREAST: No palpable masses or nodules in either right or left breasts.  Left mastectomy and reconstruction appears intact without any palpable lumps or nodules.  No palpable axillary supraclavicular or infraclavicular adenopathy no breast tenderness or nipple discharge. (exam performed in the presence of a chaperone)  LABORATORY DATA:  I have reviewed the data as listed CMP Latest Ref Rng & Units 05/05/2018 04/29/2018 02/27/2018  Glucose 70 - 99 mg/dL 113(H) 121(H) 79  BUN 6 - 23 mg/dL '21 19 23  ' Creatinine 0.40 - 1.20 mg/dL 0.90 1.00 1.21(H)  Sodium 135 - 145 mEq/L 143 140 143  Potassium 3.5 - 5.1 mEq/L 4.0 3.6 4.3  Chloride 96 - 112 mEq/L 103 102 104  CO2 19 - 32 mEq/L 31 - 31  Calcium 8.4 - 10.5 mg/dL 9.4 - 9.5  Total Protein 6.5 - 8.1 g/dL - - 6.9  Total Bilirubin 0.3 - 1.2 mg/dL - - 0.4  Alkaline Phos 38 - 126 U/L - - 91  AST 15 - 41 U/L - - 21  ALT 0 - 44 U/L - - 16    Lab Results  Component Value Date   WBC 6.6 02/27/2018   HGB 11.2 (L) 04/29/2018   HCT 33.0 (L) 04/29/2018   MCV 82.9 02/27/2018   PLT 186 02/27/2018   NEUTROABS 4.0 02/27/2018    ASSESSMENT & PLAN:  Breast cancer of upper-inner quadrant of  left female breast Status post left mastectomy with axillary lymph node dissection November 2011 and triple negative disease with Ki-67 13% followed by immediate reconstruction status post adjuvant chemotherapy with AC followed by Taxol carbo develop neuropathy and myelosuppression  Surveillance:  1. Breast exam 02/27/2018: No palpable lumps or nodules in the right breast.  Left breast reconstructed. 2. Mammogram  04/06/2018 on right breast: Benign breast density category C  Patient is interested in genetics because of her personal history of endometrial cancer as well as melanoma.  Her daughter is very concerned about her risk.  We will request genetic counseling and testing to be performed.  Return to clinic in 1 year for follow-up with survivorship   No orders of the defined types were placed in this encounter.  The patient has a good understanding of the overall plan. she agrees with it. she will call with any problems that may develop before the  next visit here.  Nicholas Lose, MD 03/05/2019  Julious Oka Dorshimer am acting as scribe for Dr. Nicholas Lose.  I have reviewed the above documentation for accuracy and completeness, and I agree with the above.

## 2019-03-05 NOTE — Progress Notes (Signed)
Marland Kitchen REFERRING PROVIDER: Nicholas Lose, MD [place],  [place] [address]  PRIMARY PROVIDER:  Laurey Morale, MD  PRIMARY REASON FOR VISIT:  1. PERSONAL HX COLONIC POLYPS   2. Malignant neoplasm of upper-inner quadrant of left breast in female, estrogen receptor negative ([place])   3. History of uterine cancer   4. History of melanoma      HISTORY OF PRESENT ILLNESS:   Rebekah Moreno, a 73 y.o. female, was seen for a [place] cancer genetics consultation at the request of Dr. Lindi Adie due to a personal history of cancer.  [name] presents to clinic today to discuss the possibility of a hereditary predisposition to cancer, genetic testing, and to further clarify her future cancer risks, as well as potential cancer risks for family members.   In 2011, at the age of 54, [name] was diagnosed with triple negative breast cancer.  In 2018-2019 she was also diagnosed with uterine cancer and melanoma.  Over the years, she has had 3-4 colon polyps.    CANCER HISTORY:  Oncology History  Breast cancer of upper-inner quadrant of left female breast ([place])  03/30/2010 Surgery   Left Mastectomy: Multifocal LN Negative, ER/PR Neg, Her 2 Neg, Ki 67: 18%, Lef Breast reconstruction   04/25/2010 - 09/20/2010 Chemotherapy   AC X 4, Taxol carbo X 7, Taxol X 5      RISK FACTORS:  Menarche was at age 29-13.  First live birth at age 18.  OCP use for approximately 11 years.  Ovaries intact: no.  Hysterectomy: yes.  Menopausal status: postmenopausal.  HRT use: 0 years. Colonoscopy: yes; 3-4 colon polyps. Mammogram within the last year: no. Number of breast biopsies: 1. Up to date with pelvic exams: no. Any excessive radiation exposure in the past: yes  Past Medical History:  Diagnosis Date  . Anemia   . Anxiety   . Asthma   . CKD (chronic kidney disease) stage 2, GFR 60-89 ml/min   . Complication of anesthesia    per pt after knee arthroscopy in 2002 had prolonged  paralyzed body/ muscle weakness for 24 hours after surgery  . Compression fx, lumbar spine ([place])    l2  . DOE (dyspnea on exertion)   . Environmental allergies    dust mites, mold, animal dander  . Fever blister   . GERD (gastroesophageal reflux disease)   . Hemorrhoids   . Hiatal hernia   . History of adenomatous polyp of colon   . History of esophageal stricture 2005  . History of melanoma   . History of uterine cancer   . Hyperlipidemia   . Hypertension   . Malignant neoplasm of upper-inner quadrant of left breast in female, estrogen receptor negative [facility]) oncologist-  dr Lindi Adie--- per lov in epic , no recurrence   Multifocal invasive ductal carcinoma left breast,  triple negative--  03-21-2010  s/p  left modified radical mastectomy with node dissection (negative),  reconstruction 11-15-2010/   completed chemotherapy 09-20-2010  . OA (osteoarthritis)   . Osteoporosis   . Personal history of chemotherapy 04-25-2010  to  09-20-2010   left breast cancer  . PMB (postmenopausal bleeding)     Past Surgical History:  Procedure Laterality Date  . BREAST RECONSTRUCTION Left 11/14/2009   dr Harlow Mares  _0   . CATARACT EXTRACTION W/ INTRAOCULAR LENS  IMPLANT, BILATERAL  2017  . COLONOSCOPY  01-31-14   per Dr. Fuller Plan, adenomatous polyps, repeat in 5 yrs   . DILATATION &  CURETTAGE/HYSTEROSCOPY WITH MYOSURE N/A 04/29/2018   Procedure: DILATATION & CURETTAGE/HYSTEROSCOPY WITH MYOSURE;  Surgeon: Molli Posey, MD;  Location: [facility];  Service: Gynecology;  Laterality: N/A;  . FEMUR IM NAIL Right 07/04/2012   Procedure: INTRAMEDULLARY (IM) RETROGRADE FEMORAL NAILING;  Surgeon: Hessie Dibble, MD;  Location: [facility];  Service: Orthopedics;  Laterality: Right;  . [place]  . KNEE ARTHROSCOPY Left 02/2001  . KNEE ARTHROSCOPY    . LUMBAR DISC SURGERY  11/2000   dr Hal Neer   L4--5  . MODIFIED RADICAL MASTECTOMY W/ AXILLARY LYMPH NODE DISSECTION Left 03-21-2010    dr Zella Richer  _0   . PORT-A-CATH REMOVAL  12-17-2010   dr Zella Richer  . PORTACATH PLACEMENT  04-29-2010  dr Zella Richer  . RHINOPLASTY     dr Janace Hoard  . TOTAL KNEE ARTHROPLASTY Left 04-26-2002   dr beane  _1   . TRANSTHORACIC ECHOCARDIOGRAM  03/07/2016   mild LVH,  ef 60-65%/  mild LAE/  trivial PR and TR    Social History   Socioeconomic History  . Marital status: Widowed    Spouse name: Not on file  . Number of children: Not on file  . Years of education: Not on file  . Highest education level: Not on file  Occupational History  . Not on file  Social Needs  . Financial resource strain: Not on file  . Food insecurity    Worry: Not on file    Inability: Not on file  . Transportation needs    Medical: Not on file    Non-medical: Not on file  Tobacco Use  . Smoking status: Never Smoker  . Smokeless tobacco: Never Used  Substance and Sexual Activity  . Alcohol use: Yes    Alcohol/week: 0.0 standard drinks    Comment: very seldom  . Drug use: Never  . Sexual activity: Yes    Birth control/protection: Post-menopausal  Lifestyle  . Physical activity    Days per week: Not on file    Minutes per session: Not on file  . Stress: Not on file  Relationships  . Social Herbalist on phone: Not on file    Gets together: Not on file    Attends religious service: Not on file    Active member of club or organization: Not on file    Attends meetings of clubs or organizations: Not on file    Relationship status: Not on file  Other Topics Concern  . Not on file  Social History Narrative  . Not on file     FAMILY HISTORY:  We obtained a detailed, 4-generation family history.  Significant diagnoses are listed below: Family History  Problem Relation Age of Onset  . Heart failure Mother   . Other Brother 16       died in [place]  . Arthritis Other   . Hypertension Other   . Cancer Other   . Stroke Other   . Heart disease Other   . Heart disease Paternal Grandmother    . Down syndrome Maternal Uncle   . Other Maternal Grandfather        "consumption"  . Heart disease Paternal Grandfather   . Colon cancer Neg Hx     The patient has one daughter who is cancer free.  She had a brother who died at 82 in a car accident.  Both parents are deceased.  The patient's mother had one full brother with down syndrome and several  half siblings from both parents.  There is no known cancer in any individuals in the maternal side of the family.  The patients father died of suicide at 75.  He had a brother and sister who are cancer free.  Both paternal grandparents are deceased.  Ms. Rake is unaware of previous family history of genetic testing for hereditary cancer risks. Patient's ancestors are of [place] and [place] descent. There is no reported Ashkenazi Jewish ancestry. There is no known consanguinity.  GENETIC COUNSELING ASSESSMENT: Ms. Bugh is a 73 y.o. female with a personal history of cancer which is somewhat suggestive of a sporadic risk for cancer given her age of onset of each of the cancers. We, therefore, discussed and recommended the following at today's visit.   DISCUSSION: We discussed that 5 - 10% of cancer is hereditary, with different hereditary risks for cancer based on the type.  Breast and melanoma have a 5-10% hereditary risk, while uterine cancer is closer to about 3% of cases.  She does not have a family history of cancer, and her cancer is over age 55, making these less likely to be associated with a hereditary mutation in a gene.   We discussed with Ms. Sutherlin that the personal history does not meet insurance or NCCN criteria for genetic testing and, therefore, is not highly consistent with a familial hereditary cancer syndrome.  We feel she is at low risk to harbor a gene mutation associated with such a condition. She states that her daughter is interested in her getting tested. We reviewed the characteristics, features and inheritance patterns of  hereditary cancer syndromes. We also discussed genetic testing, including the appropriate family members to test, the process of testing, insurance coverage and turn-around-time for results. We discussed the implications of a negative, positive, carrier and/or variant of uncertain significant result. We recommended Ms. Recendiz pursue genetic testing for the multi cancer gene panel so that we can get the melanoma genes tested as well.  The Multi-Gene Panel offered by [facility] includes sequencing and/or deletion duplication testing of the following 85 genes: AIP, ALK, APC, ATM, AXIN2,BAP1,  BARD1, BLM, BMPR1A, BRCA1, BRCA2, BRIP1, CASR, CDC73, CDH1, CDK4, CDKN1B, CDKN1C, CDKN2A (p14ARF), CDKN2A (p16INK4a), CEBPA, CHEK2, CTNNA1, DICER1, DIS3L2, EGFR (c.2369C>T, p.Thr790Met variant only), EPCAM (Deletion/duplication testing only), FH, FLCN, GATA2, GPC3, GREM1 (Promoter region deletion/duplication testing only), HOXB13 (c.251G>A, p.Gly84Glu), HRAS, KIT, MAX, MEN1, MET, MITF (c.952G>A, p.Glu318Lys variant only), MLH1, MSH2, MSH3, MSH6, MUTYH, NBN, NF1, NF2, NTHL1, PALB2, PDGFRA, PHOX2B, PMS2, POLD1, POLE, POT1, PRKAR1A, PTCH1, PTEN, RAD50, RAD51C, RAD51D, RB1, RECQL4, RET, RNF43, RUNX1, SDHAF2, SDHA (sequence changes only), SDHB, SDHC, SDHD, SMAD4, SMARCA4, SMARCB1, SMARCE1, STK11, SUFU, TERC, TERT, TMEM127, TP53, TSC1, TSC2, VHL, WRN and WT1.    Based on Ms. Verrastro's personal history of cancer, she does not meets medical criteria for genetic testing. We discussed that if insurance does not cover testing, the out of pocket cost would be through the patient pay program and would be around $250.   PLAN: After considering the risks, benefits, and limitations, Ms. Lagares provided informed consent to pursue genetic testing and the blood sample was sent to [facility] for analysis of the Multi cancer gene panel. Results should be available within approximately 2-3 weeks' time, at which point they will be disclosed by  telephone to Ms. Gwilliam, as will any additional recommendations warranted by these results. Ms. Couchman will receive a summary of her genetic counseling visit and a copy of her results once available. This  information will also be available in Epic.   Lastly, we encouraged Ms. Sanville to remain in contact with cancer genetics annually so that we can continuously update the family history and inform her of any changes in cancer genetics and testing that may be of benefit for this family.   Ms. Scorsone questions were answered to her satisfaction today. Our contact information was provided should additional questions or concerns arise. Thank you for the referral and allowing [place] to share in the care of your patient.   Layton Tappan P. Florene Glen, [place], [facility] Licensed, Insurance risk surveyor [email or web address] phone: [phone]  The patient was seen for a total of 35 minutes in face-to-face genetic counseling.  This patient was discussed with Drs. Magrinat, Lindi Adie and/or Burr Medico who agrees with the above.    _______________________________________________________________________ For Office Staff:  Number of people involved in session: 1 Was an Intern/ student involved with case: yes Delorise Royals

## 2019-03-05 NOTE — Assessment & Plan Note (Signed)
Status post left mastectomy with axillary lymph node dissection November 2011 and triple negative disease with Ki-67 13% followed by immediate reconstruction status post adjuvant chemotherapy with AC followed by Taxol carbo develop neuropathy and myelosuppression  Surveillance:  1. Breast exam 02/27/2018: No palpable lumps or nodules in the right breast.  Left breast reconstructed. 2. Mammogram 03/05/2017: Benign  Shortness of breathto minimal exertion:  Patient is trying to lose weight and exercise regularly.  Blood work review: Patient has mild anemia and mild renal insufficiency.  I suspect the anemia is due to renal insufficiency.   Return to clinic in 1 year for follow-up with survivorship

## 2019-03-10 ENCOUNTER — Telehealth: Payer: Self-pay | Admitting: Hematology and Oncology

## 2019-03-10 NOTE — Telephone Encounter (Signed)
I left a message regading schedule

## 2019-03-15 ENCOUNTER — Encounter: Payer: Self-pay | Admitting: Genetic Counselor

## 2019-03-15 ENCOUNTER — Ambulatory Visit: Payer: Self-pay | Admitting: Genetic Counselor

## 2019-03-15 ENCOUNTER — Telehealth: Payer: Self-pay | Admitting: Genetic Counselor

## 2019-03-15 DIAGNOSIS — Z1379 Encounter for other screening for genetic and chromosomal anomalies: Secondary | ICD-10-CM

## 2019-03-15 NOTE — Telephone Encounter (Signed)
LM on VM that results are back and to please call.  Left CB number. 

## 2019-03-15 NOTE — Telephone Encounter (Signed)
Revealed negative genetic testing.  Discussed that we do not know why she has breast cancer or why there is cancer in the family. It could be due to a different gene that we are not testing, or maybe our current technology may not be able to pick something up.  It will be important for her to keep in contact with genetics to keep up with whether additional testing may be needed. 

## 2019-03-15 NOTE — Progress Notes (Signed)
HPI:  Rebekah Moreno was previously seen in the [place] clinic due to a personal history of cancer and concerns regarding a hereditary predisposition to cancer. Please refer to our prior cancer genetics clinic note for more information regarding our discussion, assessment and recommendations, at the time. Rebekah Moreno recent genetic test results were disclosed to her, as were recommendations warranted by these results. These results and recommendations are discussed in more detail below.  CANCER HISTORY:  Oncology History  Breast cancer of upper-inner quadrant of left female breast ([place])  03/30/2010 Surgery   Left Mastectomy: Multifocal LN Negative, ER/PR Neg, Her 2 Neg, Ki 67: 18%, Lef Breast reconstruction   04/25/2010 - 09/20/2010 Chemotherapy   AC X 4, Taxol carbo X 7, Taxol X 5   03/15/2019 Genetic Testing   Negative genetic testing on the multicancer gene panel.  The Multi-Gene Panel offered by [facility] includes sequencing and/or deletion duplication testing of the following 85 genes: AIP, ALK, APC, ATM, AXIN2,BAP1,  BARD1, BLM, BMPR1A, BRCA1, BRCA2, BRIP1, CASR, CDC73, CDH1, CDK4, CDKN1B, CDKN1C, CDKN2A (p14ARF), CDKN2A (p16INK4a), CEBPA, CHEK2, CTNNA1, DICER1, DIS3L2, EGFR (c.2369C>T, p.Thr790Met variant only), EPCAM (Deletion/duplication testing only), FH, FLCN, GATA2, GPC3, GREM1 (Promoter region deletion/duplication testing only), HOXB13 (c.251G>A, p.Gly84Glu), HRAS, KIT, MAX, MEN1, MET, MITF (c.952G>A, p.Glu318Lys variant only), MLH1, MSH2, MSH3, MSH6, MUTYH, NBN, NF1, NF2, NTHL1, PALB2, PDGFRA, PHOX2B, PMS2, POLD1, POLE, POT1, PRKAR1A, PTCH1, PTEN, RAD50, RAD51C, RAD51D, RB1, RECQL4, RET, RNF43, RUNX1, SDHAF2, SDHA (sequence changes only), SDHB, SDHC, SDHD, SMAD4, SMARCA4, SMARCB1, SMARCE1, STK11, SUFU, TERC, TERT, TMEM127, TP53, TSC1, TSC2, VHL, WRN and WT1.  The report date is March 15, 2019.     FAMILY HISTORY:  We obtained a detailed, 4-generation family history.   Significant diagnoses are listed below: Family History  Problem Relation Age of Onset  . Heart failure Mother   . Other Brother 16       died in Fifty-Six  . Arthritis Other   . Hypertension Other   . Cancer Other   . Stroke Other   . Heart disease Other   . Heart disease Paternal Grandmother   . Down syndrome Maternal Uncle   . Other Maternal Grandfather        "consumption"  . Heart disease Paternal Grandfather   . Colon cancer Neg Hx       GENETIC TEST RESULTS: Genetic testing reported out on March 15, 2019 through the multi-cancer panel found no pathogenic mutations. The Multi-Gene Panel offered by [facility] includes sequencing and/or deletion duplication testing of the following 85 genes: AIP, ALK, APC, ATM, AXIN2,BAP1,  BARD1, BLM, BMPR1A, BRCA1, BRCA2, BRIP1, CASR, CDC73, CDH1, CDK4, CDKN1B, CDKN1C, CDKN2A (p14ARF), CDKN2A (p16INK4a), CEBPA, CHEK2, CTNNA1, DICER1, DIS3L2, EGFR (c.2369C>T, p.Thr790Met variant only), EPCAM (Deletion/duplication testing only), FH, FLCN, GATA2, GPC3, GREM1 (Promoter region deletion/duplication testing only), HOXB13 (c.251G>A, p.Gly84Glu), HRAS, KIT, MAX, MEN1, MET, MITF (c.952G>A, p.Glu318Lys variant only), MLH1, MSH2, MSH3, MSH6, MUTYH, NBN, NF1, NF2, NTHL1, PALB2, PDGFRA, PHOX2B, PMS2, POLD1, POLE, POT1, PRKAR1A, PTCH1, PTEN, RAD50, RAD51C, RAD51D, RB1, RECQL4, RET, RNF43, RUNX1, SDHAF2, SDHA (sequence changes only), SDHB, SDHC, SDHD, SMAD4, SMARCA4, SMARCB1, SMARCE1, STK11, SUFU, TERC, TERT, TMEM127, TP53, TSC1, TSC2, VHL, WRN and WT1.  The test report has been scanned into EPIC and is located under the Molecular Pathology section of the Results Review tab.  A portion of the result report is included below for reference.     We discussed with Rebekah Moreno that because current genetic testing is  not perfect, it is possible there may be a gene mutation in one of these genes that current testing cannot detect, but that chance is small.  We also discussed, that  there could be another gene that has not yet been discovered, or that we have not yet tested, that is responsible for the cancer diagnoses in the family. It is also possible there is a hereditary cause for the cancer in the family that Rebekah Moreno did not inherit and therefore was not identified in her testing.  Therefore, it is important to remain in touch with cancer genetics in the future so that we can continue to offer Rebekah Moreno the most up to date genetic testing.   ADDITIONAL GENETIC TESTING: We discussed with Rebekah Moreno that her genetic testing was fairly extensive.  If there are genes identified to increase cancer risk that can be analyzed in the future, we would be happy to discuss and coordinate this testing at that time.    CANCER SCREENING RECOMMENDATIONS: Rebekah Moreno test result is considered negative (normal).  This means that we have not identified a hereditary cause for her personal  history of cancer at this time. Most cancers happen by chance and this negative test suggests that her cancer may fall into this category.    While reassuring, this does not definitively rule out a hereditary predisposition to cancer. It is still possible that there could be genetic mutations that are undetectable by current technology. There could be genetic mutations in genes that have not been tested or identified to increase cancer risk.  Therefore, it is recommended she continue to follow the cancer management and screening guidelines provided by her oncology and primary healthcare provider.   An individual's cancer risk and medical management are not determined by genetic test results alone. Overall cancer risk assessment incorporates additional factors, including personal medical history, family history, and any available genetic information that may result in a personalized plan for cancer prevention and surveillance  RECOMMENDATIONS FOR FAMILY MEMBERS:  Individuals in this family might be at some  increased risk of developing cancer, over the general population risk, simply due to the family history of cancer.  We recommended women in this family have a yearly mammogram beginning at age 29, or 36 years younger than the earliest onset of cancer, an annual clinical breast exam, and perform monthly breast self-exams. Women in this family should also have a gynecological exam as recommended by their primary provider. All family members should have a colonoscopy by age 27.  FOLLOW-UP: Lastly, we discussed with Rebekah Moreno that cancer genetics is a rapidly advancing field and it is possible that new genetic tests will be appropriate for her and/or her family members in the future. We encouraged her to remain in contact with cancer genetics on an annual basis so we can update her personal and family histories and let her know of advances in cancer genetics that may benefit this family.   Our contact number was provided. Rebekah Moreno questions were answered to her satisfaction, and she knows she is welcome to call us at anytime with additional questions or concerns.   Roma Kayser, [place], [facility] Licensed, Certified Genetic Counselor [email or web address]

## 2019-04-13 ENCOUNTER — Other Ambulatory Visit: Payer: Self-pay | Admitting: Obstetrics and Gynecology

## 2019-04-13 DIAGNOSIS — Z1231 Encounter for screening mammogram for malignant neoplasm of breast: Secondary | ICD-10-CM

## 2019-05-11 DIAGNOSIS — D126 Benign neoplasm of colon, unspecified: Secondary | ICD-10-CM | POA: Diagnosis not present

## 2019-05-11 DIAGNOSIS — C50912 Malignant neoplasm of unspecified site of left female breast: Secondary | ICD-10-CM | POA: Diagnosis not present

## 2019-05-11 DIAGNOSIS — M810 Age-related osteoporosis without current pathological fracture: Secondary | ICD-10-CM | POA: Diagnosis not present

## 2019-05-11 DIAGNOSIS — E669 Obesity, unspecified: Secondary | ICD-10-CM | POA: Diagnosis not present

## 2019-05-11 DIAGNOSIS — Z780 Asymptomatic menopausal state: Secondary | ICD-10-CM | POA: Diagnosis not present

## 2019-05-11 DIAGNOSIS — M1990 Unspecified osteoarthritis, unspecified site: Secondary | ICD-10-CM | POA: Diagnosis not present

## 2019-05-11 DIAGNOSIS — I10 Essential (primary) hypertension: Secondary | ICD-10-CM | POA: Diagnosis not present

## 2019-05-11 DIAGNOSIS — R69 Illness, unspecified: Secondary | ICD-10-CM | POA: Diagnosis not present

## 2019-05-11 DIAGNOSIS — C541 Malignant neoplasm of endometrium: Secondary | ICD-10-CM | POA: Diagnosis not present

## 2019-05-11 DIAGNOSIS — J45909 Unspecified asthma, uncomplicated: Secondary | ICD-10-CM | POA: Diagnosis not present

## 2019-06-08 ENCOUNTER — Ambulatory Visit: Payer: Medicare HMO

## 2019-06-08 ENCOUNTER — Other Ambulatory Visit: Payer: Self-pay

## 2019-06-08 ENCOUNTER — Ambulatory Visit
Admission: RE | Admit: 2019-06-08 | Discharge: 2019-06-08 | Disposition: A | Discharge status: Home / Self Care | Payer: Medicare HMO | Source: Ambulatory Visit | Attending: Obstetrics and Gynecology | Admitting: Obstetrics and Gynecology

## 2019-06-08 ENCOUNTER — Ambulatory Visit: Payer: Medicare Other | Attending: Internal Medicine

## 2019-06-08 DIAGNOSIS — Z23 Encounter for immunization: Secondary | ICD-10-CM

## 2019-06-08 DIAGNOSIS — Z1231 Encounter for screening mammogram for malignant neoplasm of breast: Secondary | ICD-10-CM

## 2019-06-08 NOTE — Progress Notes (Signed)
   Covid-19 Vaccination Clinic  Name:  Rebekah Moreno    MRN: [id number] DOB: December 17, 1945  06/08/2019  [name] was observed post Covid-19 immunization for 15 minutes without incidence. She was provided with Vaccine Information Sheet and instruction to access the V-Safe system.   [name] was instructed to call 911 with any severe reactions post vaccine: Marland Kitchen Difficulty breathing  . Swelling of your face and throat  . A fast heartbeat  . A bad rash all over your body  . Dizziness and weakness    Immunizations Administered    Name Date Dose VIS Date Route   Pfizer COVID-19 Vaccine 06/08/2019  3:15 PM 0.3 mL 04/30/2019 Intramuscular   Manufacturer: [employer], [employer]   Lot: [id number]   [place]: [id number]

## 2019-06-28 ENCOUNTER — Ambulatory Visit: Payer: Medicare HMO | Attending: Internal Medicine

## 2019-06-28 DIAGNOSIS — Z23 Encounter for immunization: Secondary | ICD-10-CM

## 2019-06-28 NOTE — Progress Notes (Signed)
   Covid-19 Vaccination Clinic  Name:  SAE EUTSLER    MRN: [id number] DOB: Oct 07, 1945  06/28/2019  Ms. Crespin was observed post Covid-19 immunization for 15 minutes without incidence. She was provided with Vaccine Information Sheet and instruction to access the V-Safe system.   Ms. Neiderer was instructed to call 911 with any severe reactions post vaccine: Marland Kitchen Difficulty breathing  . Swelling of your face and throat  . A fast heartbeat  . A bad rash all over your body  . Dizziness and weakness    Immunizations Administered    Name Date Dose VIS Date Route   Pfizer COVID-19 Vaccine 06/28/2019  5:35 PM 0.3 mL 04/30/2019 Intramuscular   Manufacturer: [employer]   Lot: [id number]   [place]: [id number]

## 2020-03-05 NOTE — Progress Notes (Signed)
Patient Care Team: Laurey Morale, MD as PCP - General  DIAGNOSIS:    ICD-10-CM   1. Malignant neoplasm of upper-inner quadrant of left breast in female, estrogen receptor negative ([place])  C50.212    Z17.1     SUMMARY OF ONCOLOGIC HISTORY: Oncology History  Breast cancer of upper-inner quadrant of left female breast ([place])  03/30/2010 Surgery   Left Mastectomy: Multifocal LN Negative, ER/PR Neg, Her 2 Neg, Ki 67: 18%, Lef Breast reconstruction   04/25/2010 - 09/20/2010 Chemotherapy   AC X 4, Taxol carbo X 7, Taxol X 5   03/15/2019 Genetic Testing   Negative genetic testing on the multicancer gene panel.  The Multi-Gene Panel offered by [facility] includes sequencing and/or deletion duplication testing of the following 85 genes: AIP, ALK, APC, ATM, AXIN2,BAP1,  BARD1, BLM, BMPR1A, BRCA1, BRCA2, BRIP1, CASR, CDC73, CDH1, CDK4, CDKN1B, CDKN1C, CDKN2A (p14ARF), CDKN2A (p16INK4a), CEBPA, CHEK2, CTNNA1, DICER1, DIS3L2, EGFR (c.2369C>T, p.Thr790Met variant only), EPCAM (Deletion/duplication testing only), FH, FLCN, GATA2, GPC3, GREM1 (Promoter region deletion/duplication testing only), HOXB13 (c.251G>A, p.Gly84Glu), HRAS, KIT, MAX, MEN1, MET, MITF (c.952G>A, p.Glu318Lys variant only), MLH1, MSH2, MSH3, MSH6, MUTYH, NBN, NF1, NF2, NTHL1, PALB2, PDGFRA, PHOX2B, PMS2, POLD1, POLE, POT1, PRKAR1A, PTCH1, PTEN, RAD50, RAD51C, RAD51D, RB1, RECQL4, RET, RNF43, RUNX1, SDHAF2, SDHA (sequence changes only), SDHB, SDHC, SDHD, SMAD4, SMARCA4, SMARCB1, SMARCE1, STK11, SUFU, TERC, TERT, TMEM127, TP53, TSC1, TSC2, VHL, WRN and WT1.  The report date is March 15, 2019.     CHIEF COMPLIANT: Surveillance of breast cancer  INTERVAL HISTORY: Rebekah Moreno is a 74 y.o. with above-mentioned history of triple negative left breast cancer treated with mastectomy, adjuvant chemotherapy, and is currently on surveillance. Mammogram of the right breast on 06/08/19 showed no evidence of malignancy. She presents to the clinic  today for follow-up.   ALLERGIES:  is allergic to succinylcholine.  MEDICATIONS:  Current Outpatient Medications  Medication Sig Dispense Refill  . acyclovir (ZOVIRAX) 400 MG tablet TAKE 1 TABLET BY MOUTH EVERY 8 HOURS AS NEEDED FOR FEVER BLISTERS (Patient taking differently: Take 400 mg by mouth as needed. TAKE 1 TABLET BY MOUTH EVERY 8 HOURS AS NEEDED FOR FEVER BLISTERS) 60 tablet 3  . albuterol (PROVENTIL HFA;VENTOLIN HFA) 108 (90 Base) MCG/ACT inhaler Inhale 2 puffs into the lungs every 4 (four) hours as needed for wheezing or shortness of breath. (Patient taking differently: Inhale 2 puffs into the lungs every 4 (four) hours as needed for wheezing or shortness of breath. ) 1 Inhaler 5  . ALPRAZolam (XANAX) 1 MG tablet Take 1/2 tablet by mouth three times daily as needed for anxiety 45 tablet 5  . celecoxib (CELEBREX) 200 MG capsule TAKE 1 CAPSULE BY MOUTH TWICE A DAY (Patient taking differently: Take 200 mg by mouth daily. ) 180 capsule 3  . enalapril-hydrochlorothiazide (VASERETIC) 10-25 MG tablet TAKE 1 TABLET BY MOUTH EVERY DAY 90 tablet 3  . ibuprofen (ADVIL,MOTRIN) 200 MG tablet Take 200 mg by mouth every 6 (six) hours as needed.    . metoprolol succinate (TOPROL-XL) 25 MG 24 hr tablet Take 1 tablet (25 mg total) by mouth daily. 90 tablet 3  . Multiple Vitamins-Minerals (THERA-M) TABS Take by mouth.    Marland Kitchen NEXIUM 40 MG capsule TAKE 1 CAPSULE BY MOUTH EVERY DAY 90 capsule 3  . sertraline (ZOLOFT) 100 MG tablet Take 1 tablet (100 mg total) by mouth at bedtime. 90 tablet 3  . simvastatin (ZOCOR) 40 MG tablet Take 1 tablet (40 mg total)  by mouth daily at 6 PM. 90 tablet 3   No current facility-administered medications for this visit.    PHYSICAL EXAMINATION: ECOG PERFORMANCE STATUS: 1 - Symptomatic but completely ambulatory  Vitals:   03/06/20 1047  BP: (!) 142/74  Pulse: 65  Resp: 18  Temp: (!) 97.2 F (36.2 C)  SpO2: 97%   Filed Weights   03/06/20 1047  Weight: 218 lb 8 oz  (99.1 kg)    BREAST: No palpable masses or nodules in either right or left breasts. No palpable axillary supraclavicular or infraclavicular adenopathy no breast tenderness or nipple discharge. (exam performed in the presence of a chaperone)  LABORATORY DATA:  I have reviewed the data as listed CMP Latest Ref Rng & Units 05/05/2018 04/29/2018 02/27/2018  Glucose 70 - 99 mg/dL 113(H) 121(H) 79  BUN 6 - 23 mg/dL '21 19 23  ' Creatinine 0.40 - 1.20 mg/dL 0.90 1.00 1.21(H)  Sodium 135 - 145 mEq/L 143 140 143  Potassium 3.5 - 5.1 mEq/L 4.0 3.6 4.3  Chloride 96 - 112 mEq/L 103 102 104  CO2 19 - 32 mEq/L 31 - 31  Calcium 8.4 - 10.5 mg/dL 9.4 - 9.5  Total Protein 6.5 - 8.1 g/dL - - 6.9  Total Bilirubin 0.3 - 1.2 mg/dL - - 0.4  Alkaline Phos 38 - 126 U/L - - 91  AST 15 - 41 U/L - - 21  ALT 0 - 44 U/L - - 16    Lab Results  Component Value Date   WBC 6.6 02/27/2018   HGB 11.2 (L) 04/29/2018   HCT 33.0 (L) 04/29/2018   MCV 82.9 02/27/2018   PLT 186 02/27/2018   NEUTROABS 4.0 02/27/2018    ASSESSMENT & PLAN:  Breast cancer of upper-inner quadrant of left female breast Status post left mastectomy with axillary lymph node dissection November 2011 and triple negative disease with Ki-67 13% followed by immediate reconstruction status post adjuvant chemotherapy with AC followed by Taxol carbo develop neuropathy and myelosuppression  Surveillance:  1. Breast exam10/18/2021: No palpable lumps or nodules in the right breast. Left breast reconstructed. 2. Mammogram  06/09/2019 on right breast: Benign breast density category C  Genetic testing: Negative for any mutations. Return to clinic in 1 year for follow-up.  No orders of the defined types were placed in this encounter.  The patient has a good understanding of the overall plan. she agrees with it. she will call with any problems that may develop before the next visit here.  Total time spent: 20 mins including face to face time and  time spent for planning, charting and coordination of care  Nicholas Lose, MD 03/06/2020  I, Cloyde Reams Dorshimer, am acting as scribe for Dr. Nicholas Lose.  I have reviewed the above documentation for accuracy and completeness, and I agree with the above.

## 2020-03-06 ENCOUNTER — Other Ambulatory Visit: Payer: Self-pay

## 2020-03-06 ENCOUNTER — Inpatient Hospital Stay: Payer: Medicare HMO | Attending: Hematology and Oncology | Admitting: Hematology and Oncology

## 2020-03-06 DIAGNOSIS — C50212 Malignant neoplasm of upper-inner quadrant of left female breast: Secondary | ICD-10-CM | POA: Diagnosis not present

## 2020-03-06 DIAGNOSIS — Z171 Estrogen receptor negative status [ER-]: Secondary | ICD-10-CM | POA: Insufficient documentation

## 2020-03-06 DIAGNOSIS — Z79899 Other long term (current) drug therapy: Secondary | ICD-10-CM | POA: Insufficient documentation

## 2020-03-06 DIAGNOSIS — Z9012 Acquired absence of left breast and nipple: Secondary | ICD-10-CM | POA: Diagnosis not present

## 2020-03-06 MED ORDER — APIXABAN 5 MG PO TABS: 5.0000 mg | ORAL_TABLET | Freq: Two times a day (BID) | ORAL | Status: AC

## 2020-03-06 NOTE — Assessment & Plan Note (Signed)
Status post left mastectomy with axillary lymph node dissection November 2011 and triple negative disease with Ki-67 13% followed by immediate reconstruction status post adjuvant chemotherapy with AC followed by Taxol carbo develop neuropathy and myelosuppression  Surveillance:  1. Breast exam10/18/2021: No palpable lumps or nodules in the right breast. Left breast reconstructed. 2. Mammogram  06/09/2019 on right breast: Benign breast density category C  Genetic testing: Negative for any mutations Return to clinic in 1 year for follow-up.

## 2020-03-09 ENCOUNTER — Telehealth: Payer: Self-pay | Admitting: Hematology and Oncology

## 2020-03-09 NOTE — Telephone Encounter (Signed)
Per 10/18 LOS scheduled and left vm for patient

## 2020-04-03 ENCOUNTER — Other Ambulatory Visit: Payer: Self-pay | Admitting: Family Medicine

## 2020-04-03 DIAGNOSIS — Z1231 Encounter for screening mammogram for malignant neoplasm of breast: Secondary | ICD-10-CM

## 2020-06-08 ENCOUNTER — Ambulatory Visit
Admission: RE | Admit: 2020-06-08 | Discharge: 2020-06-08 | Disposition: A | Discharge status: Home / Self Care | Payer: Medicare HMO | Source: Ambulatory Visit | Attending: Family Medicine | Admitting: Family Medicine

## 2020-06-08 ENCOUNTER — Other Ambulatory Visit: Payer: Self-pay

## 2020-06-08 DIAGNOSIS — Z1231 Encounter for screening mammogram for malignant neoplasm of breast: Secondary | ICD-10-CM

## 2021-01-23 ENCOUNTER — Encounter: Payer: Self-pay | Admitting: Gastroenterology

## 2021-03-05 NOTE — Progress Notes (Signed)
Patient Care Team: Hoover Brunette, MD as PCP - General (Geriatric Medicine)  DIAGNOSIS:    ICD-10-CM   1. Malignant neoplasm of upper-inner quadrant of left breast in female, estrogen receptor negative ([place])  C50.212    Z17.1       SUMMARY OF ONCOLOGIC HISTORY: Oncology History  Breast cancer of upper-inner quadrant of left female breast ([place])  03/30/2010 Surgery   Left Mastectomy: Multifocal LN Negative, ER/PR Neg, Her 2 Neg, Ki 67: 18%, Lef Breast reconstruction   04/25/2010 - 09/20/2010 Chemotherapy   AC X 4, Taxol carbo X 7, Taxol X 5   03/15/2019 Genetic Testing   Negative genetic testing on the multicancer gene panel.  The Multi-Gene Panel offered by [facility] includes sequencing and/or deletion duplication testing of the following 85 genes: AIP, ALK, APC, ATM, AXIN2,BAP1,  BARD1, BLM, BMPR1A, BRCA1, BRCA2, BRIP1, CASR, CDC73, CDH1, CDK4, CDKN1B, CDKN1C, CDKN2A (p14ARF), CDKN2A (p16INK4a), CEBPA, CHEK2, CTNNA1, DICER1, DIS3L2, EGFR (c.2369C>T, p.Thr790Met variant only), EPCAM (Deletion/duplication testing only), FH, FLCN, GATA2, GPC3, GREM1 (Promoter region deletion/duplication testing only), HOXB13 (c.251G>A, p.Gly84Glu), HRAS, KIT, MAX, MEN1, MET, MITF (c.952G>A, p.Glu318Lys variant only), MLH1, MSH2, MSH3, MSH6, MUTYH, NBN, NF1, NF2, NTHL1, PALB2, PDGFRA, PHOX2B, PMS2, POLD1, POLE, POT1, PRKAR1A, PTCH1, PTEN, RAD50, RAD51C, RAD51D, RB1, RECQL4, RET, RNF43, RUNX1, SDHAF2, SDHA (sequence changes only), SDHB, SDHC, SDHD, SMAD4, SMARCA4, SMARCB1, SMARCE1, STK11, SUFU, TERC, TERT, TMEM127, TP53, TSC1, TSC2, VHL, WRN and WT1.  The report date is March 15, 2019.     CHIEF COMPLIANT: Follow-up of breast cancer  INTERVAL HISTORY: Rebekah Moreno is a 75 y.o. with above-mentioned history of triple negative left breast cancer treated with mastectomy, adjuvant chemotherapy, and is currently on surveillance. Mammogram of the right breast on 06/08/2020 showed no evidence of malignancy. She  presents to the clinic today for follow-up.  She reports no new problems or concerns.  She continues to have benign essential tremor.  Some shortness of breath to exertion which is same as before.  She attributes this to deconditioning.  ALLERGIES:  is allergic to succinylcholine.  MEDICATIONS:  Current Outpatient Medications  Medication Sig Dispense Refill   acyclovir (ZOVIRAX) 400 MG tablet TAKE 1 TABLET BY MOUTH EVERY 8 HOURS AS NEEDED FOR FEVER BLISTERS (Patient taking differently: Take 400 mg by mouth as needed. TAKE 1 TABLET BY MOUTH EVERY 8 HOURS AS NEEDED FOR FEVER BLISTERS) 60 tablet 3   albuterol (PROVENTIL HFA;VENTOLIN HFA) 108 (90 Base) MCG/ACT inhaler Inhale 2 puffs into the lungs every 4 (four) hours as needed for wheezing or shortness of breath. (Patient taking differently: Inhale 2 puffs into the lungs every 4 (four) hours as needed for wheezing or shortness of breath. ) 1 Inhaler 5   ALPRAZolam (XANAX) 1 MG tablet Take 1/2 tablet by mouth three times daily as needed for anxiety (Patient taking differently: 1 mg 2 (two) times daily as needed. Take 1/2 tablet by mouth three times daily as needed for anxiety) 45 tablet 5   apixaban (ELIQUIS) 5 MG TABS tablet Take 1 tablet (5 mg total) by mouth 2 (two) times daily. 60 tablet    celecoxib (CELEBREX) 200 MG capsule TAKE 1 CAPSULE BY MOUTH TWICE A DAY (Patient taking differently: Take 200 mg by mouth daily. ) 180 capsule 3   enalapril-hydrochlorothiazide (VASERETIC) 10-25 MG tablet TAKE 1 TABLET BY MOUTH EVERY DAY 90 tablet 3   ibuprofen (ADVIL,MOTRIN) 200 MG tablet Take 200 mg by mouth every 6 (six) hours as needed.  metoprolol succinate (TOPROL-XL) 25 MG 24 hr tablet Take 1 tablet (25 mg total) by mouth daily. 90 tablet 3   Multiple Vitamins-Minerals (THERA-M) TABS Take by mouth.     NEXIUM 40 MG capsule TAKE 1 CAPSULE BY MOUTH EVERY DAY 90 capsule 3   sertraline (ZOLOFT) 100 MG tablet Take 1 tablet (100 mg total) by mouth at bedtime.  90 tablet 3   simvastatin (ZOCOR) 40 MG tablet Take 1 tablet (40 mg total) by mouth daily at 6 PM. 90 tablet 3   No current facility-administered medications for this visit.    PHYSICAL EXAMINATION: ECOG PERFORMANCE STATUS: 1 - Symptomatic but completely ambulatory  Vitals:   03/06/21 1018  BP: (!) 145/85  Pulse: 85  Resp: 18  Temp: (!) 97.3 F (36.3 C)  SpO2: 99%   Filed Weights   03/06/21 1018  Weight: 220 lb 3.2 oz (99.9 kg)    BREAST: No palpable masses or nodules in either right or left breasts. No palpable axillary supraclavicular or infraclavicular adenopathy no breast tenderness or nipple discharge.  Left breast reconstruction (exam performed in the presence of a chaperone)  LABORATORY DATA:  I have reviewed the data as listed CMP Latest Ref Rng & Units 05/05/2018 04/29/2018 02/27/2018  Glucose 70 - 99 mg/dL 113(H) 121(H) 79  BUN 6 - 23 mg/dL _0 Creatinine 0.40 - 1.20 mg/dL 0.90 1.00 1.21(H)  Sodium 135 - 145 mEq/L 143 140 143  Potassium 3.5 - 5.1 mEq/L 4.0 3.6 4.3  Chloride 96 - 112 mEq/L 103 102 104  CO2 19 - 32 mEq/L 31 - 31  Calcium 8.4 - 10.5 mg/dL 9.4 - 9.5  Total Protein 6.5 - 8.1 g/dL - - 6.9  Total Bilirubin 0.3 - 1.2 mg/dL - - 0.4  Alkaline Phos 38 - 126 U/L - - 91  AST 15 - 41 U/L - - 21  ALT 0 - 44 U/L - - 16    Lab Results  Component Value Date   WBC 6.6 02/27/2018   HGB 11.2 (L) 04/29/2018   HCT 33.0 (L) 04/29/2018   MCV 82.9 02/27/2018   PLT 186 02/27/2018   NEUTROABS 4.0 02/27/2018    ASSESSMENT & PLAN:  Breast cancer of upper-inner quadrant of left female breast Status post left mastectomy with axillary lymph node dissection November 2011 and triple negative disease with Ki-67 13% followed by immediate reconstruction status post adjuvant chemotherapy with AC followed by Taxol carbo develop neuropathy and myelosuppression   Surveillance:   1. Breast exam 03/06/2021: No palpable lumps or nodules in the right breast.  Left breast  reconstructed. 2. Mammogram 06/08/2020 on right breast: Benign breast density category C    Endometrial cancer: Status post hysterectomy Melanoma on the right arm: Resected  Genetic testing: Negative for any mutations. Return to clinic in 1 year for follow-up.    No orders of the defined types were placed in this encounter.  The patient has a good understanding of the overall plan. she agrees with it. she will call with any problems that may develop before the next visit here.  Total time spent: 20 mins including face to face time and time spent for planning, charting and coordination of care  Rulon Eisenmenger, MD, MPH 03/06/2021  I, Thana Ates, am acting as scribe for Dr. Nicholas Lose.  I have reviewed the above documentation for accuracy and completeness, and I agree with the above.

## 2021-03-06 ENCOUNTER — Other Ambulatory Visit: Payer: Self-pay

## 2021-03-06 ENCOUNTER — Inpatient Hospital Stay: Payer: Medicare HMO | Attending: Hematology and Oncology | Admitting: Hematology and Oncology

## 2021-03-06 DIAGNOSIS — C541 Malignant neoplasm of endometrium: Secondary | ICD-10-CM | POA: Diagnosis not present

## 2021-03-06 DIAGNOSIS — Z9012 Acquired absence of left breast and nipple: Secondary | ICD-10-CM | POA: Diagnosis not present

## 2021-03-06 DIAGNOSIS — R0602 Shortness of breath: Secondary | ICD-10-CM | POA: Insufficient documentation

## 2021-03-06 DIAGNOSIS — Z7901 Long term (current) use of anticoagulants: Secondary | ICD-10-CM | POA: Insufficient documentation

## 2021-03-06 DIAGNOSIS — Z79899 Other long term (current) drug therapy: Secondary | ICD-10-CM | POA: Diagnosis not present

## 2021-03-06 DIAGNOSIS — Z90710 Acquired absence of both cervix and uterus: Secondary | ICD-10-CM | POA: Diagnosis not present

## 2021-03-06 DIAGNOSIS — C50212 Malignant neoplasm of upper-inner quadrant of left female breast: Secondary | ICD-10-CM | POA: Insufficient documentation

## 2021-03-06 DIAGNOSIS — Z171 Estrogen receptor negative status [ER-]: Secondary | ICD-10-CM | POA: Diagnosis not present

## 2021-03-06 NOTE — Assessment & Plan Note (Signed)
Status post left mastectomy with axillary lymph node dissection November 2011 and triple negative disease with Ki-67 13% followed by immediate reconstruction status post adjuvant chemotherapy with AC followed by Taxol carbo develop neuropathy and myelosuppression  Surveillance:  1. Breast exam10/18/2022: No palpable lumps or nodules in the right breast. Left breast reconstructed. 2. Mammogram1/20/2022 on right breast: Benignbreast density category C  Genetic testing: Negative for any mutations. Return to clinic in 1 year for follow-up.

## 2021-07-20 ENCOUNTER — Other Ambulatory Visit: Payer: Self-pay | Admitting: Obstetrics and Gynecology

## 2021-07-20 DIAGNOSIS — Z1231 Encounter for screening mammogram for malignant neoplasm of breast: Secondary | ICD-10-CM

## 2021-08-09 ENCOUNTER — Ambulatory Visit
Admission: RE | Admit: 2021-08-09 | Discharge: 2021-08-09 | Disposition: A | Discharge status: Home / Self Care | Payer: Medicare HMO | Source: Ambulatory Visit | Attending: Obstetrics and Gynecology | Admitting: Obstetrics and Gynecology

## 2021-08-09 DIAGNOSIS — Z1231 Encounter for screening mammogram for malignant neoplasm of breast: Secondary | ICD-10-CM

## 2022-03-04 NOTE — Progress Notes (Signed)
Patient Care Team: Lance Bosch, MD as PCP - General (Geriatric Medicine)  DIAGNOSIS:  Encounter Diagnosis  Name Primary?   Malignant neoplasm of upper-inner quadrant of left breast in female, estrogen receptor negative (HCC)     SUMMARY OF ONCOLOGIC HISTORY: Oncology History  Breast cancer of upper-inner quadrant of left female breast (HCC)  03/30/2010 Surgery   Left Mastectomy: Multifocal LN Negative, ER/PR Neg, Her 2 Neg, Ki 67: 18%, Lef Breast reconstruction   04/25/2010 - 09/20/2010 Chemotherapy   AC X 4, Taxol carbo X 7, Taxol X 5   03/15/2019 Genetic Testing   Negative genetic testing on the multicancer gene panel.  The Multi-Gene Panel offered by [facility] includes sequencing and/or deletion duplication testing of the following 85 genes: AIP, ALK, APC, ATM, AXIN2,BAP1,  BARD1, BLM, BMPR1A, BRCA1, BRCA2, BRIP1, CASR, CDC73, CDH1, CDK4, CDKN1B, CDKN1C, CDKN2A (p14ARF), CDKN2A (p16INK4a), CEBPA, CHEK2, CTNNA1, DICER1, DIS3L2, EGFR (c.2369C>T, p.Thr790Met variant only), EPCAM (Deletion/duplication testing only), FH, FLCN, GATA2, GPC3, GREM1 (Promoter region deletion/duplication testing only), HOXB13 (c.251G>A, p.Gly84Glu), HRAS, KIT, MAX, MEN1, MET, MITF (c.952G>A, p.Glu318Lys variant only), MLH1, MSH2, MSH3, MSH6, MUTYH, NBN, NF1, NF2, NTHL1, PALB2, PDGFRA, PHOX2B, PMS2, POLD1, POLE, POT1, PRKAR1A, PTCH1, PTEN, RAD50, RAD51C, RAD51D, RB1, RECQL4, RET, RNF43, RUNX1, SDHAF2, SDHA (sequence changes only), SDHB, SDHC, SDHD, SMAD4, SMARCA4, SMARCB1, SMARCE1, STK11, SUFU, TERC, TERT, TMEM127, TP53, TSC1, TSC2, VHL, WRN and WT1.  The report date is March 15, 2019.     CHIEF COMPLIANT: Follow-up of breast cancer surveillance  INTERVAL HISTORY: Rebekah Moreno is a 76 y.o. with above-mentioned history of triple negative left breast cancer treated with mastectomy, adjuvant chemotherapy, and is currently on surveillance. She presents to the clinic for a follow-up. She reports she had some  iv iron but denies bleeding. She did have black stools because of taking Advil. It only lasted for a day or two.    ALLERGIES:  is allergic to molds & smuts and succinylcholine.  MEDICATIONS:  Current Outpatient Medications  Medication Sig Dispense Refill   amiodarone (PACERONE) 200 MG tablet TAKE 1 TABLET BY MOUTH TWICE A DAY FOR 1 MONTH, THEN START ONCE DAILY     SYRINGE-NEEDLE, DISP, 3 ML (B-D 3CC LUER-LOK SYR 25GX1/2") 25G X 1-1/2" 3 ML MISC Take 1 tablet by mouth daily.     SYRINGE-NEEDLE, DISP, 3 ML (B-D 3CC LUER-LOK SYR 25GX1/2") 25G X 1-1/2" 3 ML MISC TAKE 4 CAPSULES BY MOUTH 1 HOUR PRIOR TO DENTAL APPOINTMENT     acyclovir (ZOVIRAX) 400 MG tablet TAKE 1 TABLET BY MOUTH EVERY 8 HOURS AS NEEDED FOR FEVER BLISTERS (Patient taking differently: Take 400 mg by mouth as needed. TAKE 1 TABLET BY MOUTH EVERY 8 HOURS AS NEEDED FOR FEVER BLISTERS) 60 tablet 3   albuterol (PROVENTIL HFA;VENTOLIN HFA) 108 (90 Base) MCG/ACT inhaler Inhale 2 puffs into the lungs every 4 (four) hours as needed for wheezing or shortness of breath. (Patient taking differently: Inhale 2 puffs into the lungs every 4 (four) hours as needed for wheezing or shortness of breath. ) 1 Inhaler 5   ALPRAZolam (XANAX) 0.5 MG tablet Take 0.5 mg by mouth 2 (two) times daily as needed.     ALPRAZolam (XANAX) 1 MG tablet Take 1/2 tablet by mouth three times daily as needed for anxiety (Patient taking differently: 1 mg 2 (two) times daily as needed. Take 1/2 tablet by mouth three times daily as needed for anxiety) 45 tablet 5   apixaban (ELIQUIS) 5 MG TABS  tablet Take 1 tablet (5 mg total) by mouth 2 (two) times daily. 60 tablet    enalapril-hydrochlorothiazide (VASERETIC) 10-25 MG tablet TAKE 1 TABLET BY MOUTH EVERY DAY 90 tablet 3   ibuprofen (ADVIL,MOTRIN) 200 MG tablet Take 200 mg by mouth every 6 (six) hours as needed.     metoprolol succinate (TOPROL-XL) 25 MG 24 hr tablet Take 1 tablet (25 mg total) by mouth daily. 90 tablet 3    Multiple Vitamins-Minerals (THERA-M) TABS Take by mouth.     NEXIUM 40 MG capsule TAKE 1 CAPSULE BY MOUTH EVERY DAY 90 capsule 3   sertraline (ZOLOFT) 100 MG tablet Take 1 tablet (100 mg total) by mouth at bedtime. 90 tablet 3   simvastatin (ZOCOR) 40 MG tablet Take 1 tablet (40 mg total) by mouth daily at 6 PM. 90 tablet 3   No current facility-administered medications for this visit.    PHYSICAL EXAMINATION: ECOG PERFORMANCE STATUS: 1 - Symptomatic but completely ambulatory  Vitals:   03/06/22 1016  BP: (!) 152/82  Pulse: 72  Resp: 18  Temp: (!) 97.5 F (36.4 C)  SpO2: 94%   Filed Weights   03/06/22 1016  Weight: 219 lb (99.3 kg)    BREAST: No palpable masses or nodules in either right or left breasts. No palpable axillary supraclavicular or infraclavicular adenopathy no breast tenderness or nipple discharge. (exam performed in the presence of a chaperone)  LABORATORY DATA:  I have reviewed the data as listed    Latest Ref Rng & Units 05/05/2018   11:50 AM 04/29/2018    7:01 AM 02/27/2018   10:48 AM  CMP  Glucose 70 - 99 mg/dL 811  914  79   BUN 6 - 23 mg/dL 21  19  23    Creatinine 0.40 - 1.20 mg/dL 7.82  9.56  2.13   Sodium 135 - 145 mEq/L 143  140  143   Potassium 3.5 - 5.1 mEq/L 4.0  3.6  4.3   Chloride 96 - 112 mEq/L 103  102  104   CO2 19 - 32 mEq/L 31   31   Calcium 8.4 - 10.5 mg/dL 9.4   9.5   Total Protein 6.5 - 8.1 g/dL   6.9   Total Bilirubin 0.3 - 1.2 mg/dL   0.4   Alkaline Phos 38 - 126 U/L   91   AST 15 - 41 U/L   21   ALT 0 - 44 U/L   16     Lab Results  Component Value Date   WBC 6.6 02/27/2018   HGB 11.2 (L) 04/29/2018   HCT 33.0 (L) 04/29/2018   MCV 82.9 02/27/2018   PLT 186 02/27/2018   NEUTROABS 4.0 02/27/2018    ASSESSMENT & PLAN:  Breast cancer of upper-inner quadrant of left female breast Status post left mastectomy with axillary lymph node dissection November 2011 and triple negative disease with Ki-67 13% followed by immediate  reconstruction status post adjuvant chemotherapy with AC followed by Taxol carbo   Surveillance:   1. Breast exam 03/06/2022: No palpable lumps or nodules in the right breast.  Left breast reconstructed. 2. Mammogram 08/09/21 on right breast: Benign breast density category C    Endometrial cancer: Status post hysterectomy Melanoma on the right arm: Resected Atrial fibrillation: On anticoagulation status post cardioversion   Anemia required iron infusions follows with gastroenterology at [facility]  genetic testing: Negative for any mutations. Return to clinic in 1 year for follow-up.  No orders of the defined types were placed in this encounter.  The patient has a good understanding of the overall plan. she agrees with it. she will call with any problems that may develop before the next visit here. Total time spent: 30 mins including face to face time and time spent for planning, charting and co-ordination of care   Tamsen Meek, MD 03/06/22    I Janan Ridge am scribing for Dr. Pamelia Hoit  I have reviewed the above documentation for accuracy and completeness, and I agree with the above.

## 2022-03-06 ENCOUNTER — Other Ambulatory Visit: Payer: Self-pay

## 2022-03-06 ENCOUNTER — Inpatient Hospital Stay: Payer: Medicare HMO | Attending: Hematology and Oncology | Admitting: Hematology and Oncology

## 2022-03-06 DIAGNOSIS — Z171 Estrogen receptor negative status [ER-]: Secondary | ICD-10-CM | POA: Diagnosis not present

## 2022-03-06 DIAGNOSIS — Z79899 Other long term (current) drug therapy: Secondary | ICD-10-CM | POA: Diagnosis not present

## 2022-03-06 DIAGNOSIS — R195 Other fecal abnormalities: Secondary | ICD-10-CM | POA: Diagnosis not present

## 2022-03-06 DIAGNOSIS — Z9012 Acquired absence of left breast and nipple: Secondary | ICD-10-CM | POA: Insufficient documentation

## 2022-03-06 DIAGNOSIS — I491 Atrial premature depolarization: Secondary | ICD-10-CM | POA: Diagnosis not present

## 2022-03-06 DIAGNOSIS — Z79624 Long term (current) use of inhibitors of nucleotide synthesis: Secondary | ICD-10-CM | POA: Insufficient documentation

## 2022-03-06 DIAGNOSIS — Z85820 Personal history of malignant melanoma of skin: Secondary | ICD-10-CM | POA: Diagnosis not present

## 2022-03-06 DIAGNOSIS — Z7901 Long term (current) use of anticoagulants: Secondary | ICD-10-CM | POA: Insufficient documentation

## 2022-03-06 DIAGNOSIS — C50212 Malignant neoplasm of upper-inner quadrant of left female breast: Secondary | ICD-10-CM | POA: Insufficient documentation

## 2022-03-06 DIAGNOSIS — D649 Anemia, unspecified: Secondary | ICD-10-CM | POA: Diagnosis not present

## 2022-03-06 NOTE — Assessment & Plan Note (Addendum)
Status post left mastectomy with axillary lymph node dissection November 2011 and triple negative disease with Ki-67 13% followed by immediate reconstruction status post adjuvant chemotherapy with AC followed by Taxol carbo  Surveillance:  1. Breast exam10/18/2023: No palpable lumps or nodules in the right breast. Left breast reconstructed. 2. Mammogram3/23/23on right breast: Benignbreast density category C  Endometrial cancer: Status post hysterectomy Melanoma on the right arm: Resected  Genetic testing: Negative for any mutations. Return to clinic in 1 year for follow-up.

## 2022-08-21 ENCOUNTER — Other Ambulatory Visit: Payer: Self-pay | Admitting: Obstetrics and Gynecology

## 2022-08-21 DIAGNOSIS — Z1231 Encounter for screening mammogram for malignant neoplasm of breast: Secondary | ICD-10-CM

## 2022-08-23 ENCOUNTER — Ambulatory Visit
Admission: RE | Admit: 2022-08-23 | Discharge: 2022-08-23 | Disposition: A | Discharge status: Home / Self Care | Payer: Medicare HMO | Source: Ambulatory Visit | Attending: Obstetrics and Gynecology | Admitting: Obstetrics and Gynecology

## 2022-08-23 DIAGNOSIS — Z1231 Encounter for screening mammogram for malignant neoplasm of breast: Secondary | ICD-10-CM

## 2023-02-25 ENCOUNTER — Telehealth: Payer: Self-pay | Admitting: Hematology and Oncology

## 2023-02-25 NOTE — Telephone Encounter (Signed)
02/25/23 Due to a change in the provider schedule, I called the patient and left a voice mail with the rescheduled appointment details. I mailed a reminder notice with the new appointment date and time.

## 2023-03-10 ENCOUNTER — Ambulatory Visit: Payer: Medicare HMO | Admitting: Hematology and Oncology

## 2023-04-14 ENCOUNTER — Inpatient Hospital Stay: Payer: Medicare HMO | Attending: Hematology and Oncology | Admitting: Hematology and Oncology

## 2023-04-14 VITALS — BP 154/78 | HR 88 | Temp 97.7°F | Resp 18 | Ht 66.0 in | Wt 233.3 lb

## 2023-04-14 DIAGNOSIS — I4891 Unspecified atrial fibrillation: Secondary | ICD-10-CM | POA: Insufficient documentation

## 2023-04-14 DIAGNOSIS — Z9012 Acquired absence of left breast and nipple: Secondary | ICD-10-CM | POA: Insufficient documentation

## 2023-04-14 DIAGNOSIS — M25569 Pain in unspecified knee: Secondary | ICD-10-CM | POA: Insufficient documentation

## 2023-04-14 DIAGNOSIS — D649 Anemia, unspecified: Secondary | ICD-10-CM | POA: Insufficient documentation

## 2023-04-14 DIAGNOSIS — Z171 Estrogen receptor negative status [ER-]: Secondary | ICD-10-CM

## 2023-04-14 DIAGNOSIS — Z90710 Acquired absence of both cervix and uterus: Secondary | ICD-10-CM | POA: Diagnosis not present

## 2023-04-14 DIAGNOSIS — Z85820 Personal history of malignant melanoma of skin: Secondary | ICD-10-CM | POA: Diagnosis not present

## 2023-04-14 DIAGNOSIS — Z853 Personal history of malignant neoplasm of breast: Secondary | ICD-10-CM | POA: Diagnosis present

## 2023-04-14 DIAGNOSIS — Z79899 Other long term (current) drug therapy: Secondary | ICD-10-CM | POA: Insufficient documentation

## 2023-04-14 DIAGNOSIS — C50212 Malignant neoplasm of upper-inner quadrant of left female breast: Secondary | ICD-10-CM | POA: Diagnosis not present

## 2023-04-14 DIAGNOSIS — Z7901 Long term (current) use of anticoagulants: Secondary | ICD-10-CM | POA: Diagnosis not present

## 2023-04-14 DIAGNOSIS — Z8542 Personal history of malignant neoplasm of other parts of uterus: Secondary | ICD-10-CM | POA: Diagnosis present

## 2023-04-14 NOTE — Progress Notes (Signed)
Patient Care Team: Lance Bosch, MD as PCP - General (Geriatric Medicine)  DIAGNOSIS:  Encounter Diagnosis  Name Primary?   Malignant neoplasm of upper-inner quadrant of left breast in female, estrogen receptor negative (HCC) Yes    SUMMARY OF ONCOLOGIC HISTORY: Oncology History  Breast cancer of upper-inner quadrant of left female breast (HCC)  03/30/2010 Surgery   Left Mastectomy: Multifocal LN Negative, ER/PR Neg, Her 2 Neg, Ki 67: 18%, Lef Breast reconstruction   04/25/2010 - 09/20/2010 Chemotherapy   AC X 4, Taxol carbo X 7, Taxol X 5   03/15/2019 Genetic Testing   Negative genetic testing on the multicancer gene panel.  The Multi-Gene Panel offered by [facility] includes sequencing and/or deletion duplication testing of the following 85 genes: AIP, ALK, APC, ATM, AXIN2,BAP1,  BARD1, BLM, BMPR1A, BRCA1, BRCA2, BRIP1, CASR, CDC73, CDH1, CDK4, CDKN1B, CDKN1C, CDKN2A (p14ARF), CDKN2A (p16INK4a), CEBPA, CHEK2, CTNNA1, DICER1, DIS3L2, EGFR (c.2369C>T, p.Thr790Met variant only), EPCAM (Deletion/duplication testing only), FH, FLCN, GATA2, GPC3, GREM1 (Promoter region deletion/duplication testing only), HOXB13 (c.251G>A, p.Gly84Glu), HRAS, KIT, MAX, MEN1, MET, MITF (c.952G>A, p.Glu318Lys variant only), MLH1, MSH2, MSH3, MSH6, MUTYH, NBN, NF1, NF2, NTHL1, PALB2, PDGFRA, PHOX2B, PMS2, POLD1, POLE, POT1, PRKAR1A, PTCH1, PTEN, RAD50, RAD51C, RAD51D, RB1, RECQL4, RET, RNF43, RUNX1, SDHAF2, SDHA (sequence changes only), SDHB, SDHC, SDHD, SMAD4, SMARCA4, SMARCB1, SMARCE1, STK11, SUFU, TERC, TERT, TMEM127, TP53, TSC1, TSC2, VHL, WRN and WT1.  The report date is March 15, 2019.     CHIEF COMPLIANT: Surveillance of breast cancer  HISTORY OF PRESENT ILLNESS:   History of Present Illness   The patient, with a history of breast cancer, uterine cancer, melanoma, and atrial fibrillation, presents for her annual follow-up. She was diagnosed with breast cancer in 2011 and has since undergone  chemotherapy. She also reports a history of a femur fracture, for which she had a metal rod placed. Currently, she experiences pain in the knee due to the rod rubbing against the bone. The pain is somewhat managed with Tylenol, but she reports that it still hurts. She has been advised that she would need a knee replacement, which would involve removing the current hardware. However, she has decided against this due to the extensive nature of the surgery and her age.  The patient also reports that she continues to work, despite some physical limitations. She maintains her license and recently completed her continuing education course. She has no new complaints regarding her breast or atrial fibrillation.         ALLERGIES:  is allergic to molds & smuts and succinylcholine.  MEDICATIONS:  Current Outpatient Medications  Medication Sig Dispense Refill   acyclovir (ZOVIRAX) 400 MG tablet TAKE 1 TABLET BY MOUTH EVERY 8 HOURS AS NEEDED FOR FEVER BLISTERS (Patient taking differently: Take 400 mg by mouth as needed. TAKE 1 TABLET BY MOUTH EVERY 8 HOURS AS NEEDED FOR FEVER BLISTERS) 60 tablet 3   albuterol (PROVENTIL HFA;VENTOLIN HFA) 108 (90 Base) MCG/ACT inhaler Inhale 2 puffs into the lungs every 4 (four) hours as needed for wheezing or shortness of breath. (Patient taking differently: Inhale 2 puffs into the lungs every 4 (four) hours as needed for wheezing or shortness of breath. ) 1 Inhaler 5   ALPRAZolam (XANAX) 0.5 MG tablet Take 0.5 mg by mouth 2 (two) times daily as needed.     ALPRAZolam (XANAX) 1 MG tablet Take 1/2 tablet by mouth three times daily as needed for anxiety (Patient taking differently: 1 mg 2 (two) times daily as  needed. Take 1/2 tablet by mouth three times daily as needed for anxiety) 45 tablet 5   amiodarone (PACERONE) 200 MG tablet TAKE 1 TABLET BY MOUTH TWICE A DAY FOR 1 MONTH, THEN START ONCE DAILY     apixaban (ELIQUIS) 5 MG TABS tablet Take 1 tablet (5 mg total) by mouth 2  (two) times daily. 60 tablet    enalapril-hydrochlorothiazide (VASERETIC) 10-25 MG tablet TAKE 1 TABLET BY MOUTH EVERY DAY 90 tablet 3   ibuprofen (ADVIL,MOTRIN) 200 MG tablet Take 200 mg by mouth every 6 (six) hours as needed.     metoprolol succinate (TOPROL-XL) 25 MG 24 hr tablet Take 1 tablet (25 mg total) by mouth daily. 90 tablet 3   Multiple Vitamins-Minerals (THERA-M) TABS Take by mouth.     NEXIUM 40 MG capsule TAKE 1 CAPSULE BY MOUTH EVERY DAY 90 capsule 3   sertraline (ZOLOFT) 100 MG tablet Take 1 tablet (100 mg total) by mouth at bedtime. 90 tablet 3   simvastatin (ZOCOR) 40 MG tablet Take 1 tablet (40 mg total) by mouth daily at 6 PM. 90 tablet 3   SYRINGE-NEEDLE, DISP, 3 ML (B-D 3CC LUER-LOK SYR 25GX1/2") 25G X 1-1/2" 3 ML MISC Take 1 tablet by mouth daily.     SYRINGE-NEEDLE, DISP, 3 ML (B-D 3CC LUER-LOK SYR 25GX1/2") 25G X 1-1/2" 3 ML MISC TAKE 4 CAPSULES BY MOUTH 1 HOUR PRIOR TO DENTAL APPOINTMENT     No current facility-administered medications for this visit.    PHYSICAL EXAMINATION: ECOG PERFORMANCE STATUS: 1 - Symptomatic but completely ambulatory  Vitals:   04/14/23 1140  BP: (!) 154/78  Pulse: 88  Resp: 18  Temp: 97.7 F (36.5 C)  SpO2: 97%   Filed Weights   04/14/23 1140  Weight: 233 lb 4.8 oz (105.8 kg)    Physical Exam no palpable lumps or nodules in the right breast or left reconstructed breast or axilla        (exam performed in the presence of a chaperone)  LABORATORY DATA:  I have reviewed the data as listed    Latest Ref Rng & Units 05/05/2018   11:50 AM 04/29/2018    7:01 AM 02/27/2018   10:48 AM  CMP  Glucose 70 - 99 mg/dL 119  147  79   BUN 6 - 23 mg/dL 21  19  23    Creatinine 0.40 - 1.20 mg/dL 8.29  5.62  1.30   Sodium 135 - 145 mEq/L 143  140  143   Potassium 3.5 - 5.1 mEq/L 4.0  3.6  4.3   Chloride 96 - 112 mEq/L 103  102  104   CO2 19 - 32 mEq/L 31   31   Calcium 8.4 - 10.5 mg/dL 9.4   9.5   Total Protein 6.5 - 8.1 g/dL   6.9    Total Bilirubin 0.3 - 1.2 mg/dL   0.4   Alkaline Phos 38 - 126 U/L   91   AST 15 - 41 U/L   21   ALT 0 - 44 U/L   16     Lab Results  Component Value Date   WBC 6.6 02/27/2018   HGB 11.2 (L) 04/29/2018   HCT 33.0 (L) 04/29/2018   MCV 82.9 02/27/2018   PLT 186 02/27/2018   NEUTROABS 4.0 02/27/2018    ASSESSMENT & PLAN:  Breast cancer of upper-inner quadrant of left female breast Status post left mastectomy with axillary lymph node dissection November 2011 and  triple negative disease with Ki-67 13% followed by immediate reconstruction status post adjuvant chemotherapy with AC followed by Taxol carbo   Surveillance:   1. Breast exam 04/14/2023: No palpable lumps or nodules in the right breast.  Left breast reconstructed. 2. Mammogram 08/26/2022 on right breast: Benign breast density category C    Endometrial cancer: Status post hysterectomy Melanoma on the right arm: Resected Atrial fibrillation: On anticoagulation status post cardioversion    Anemia required iron infusions  at [facility]   genetic testing: Negative for any mutations. Return to clinic in 1 year for follow-up. ------------------------------------- Assessment and Plan    Breast Cancer 13 years post-diagnosis, no current complaints of pain or discomfort in the breast. -Continue annual follow-up visits.  Knee Pain Pain due to hardware from previous femur fracture. Patient manages pain with Tylenol and uses a brace for support. -No change in management at this time due to patient's preference to avoid extensive surgery for knee replacement.  Atrial Fibrillation Patient on blood thinner for management. -Continue current management.  Uterine Cancer  CT scan showed a 0.5 cm LN. Short term F/U CT scan is being planned          No orders of the defined types were placed in this encounter.  The patient has a good understanding of the overall plan. she agrees with it. she will call with any problems that  may develop before the next visit here. Total time spent: 30 mins including face to face time and time spent for planning, charting and co-ordination of care   Tamsen Meek, MD 04/14/23

## 2023-04-14 NOTE — Assessment & Plan Note (Signed)
Status post left mastectomy with axillary lymph node dissection November 2011 and triple negative disease with Ki-67 13% followed by immediate reconstruction status post adjuvant chemotherapy with AC followed by Taxol carbo   Surveillance:   1. Breast exam 04/14/2023: No palpable lumps or nodules in the right breast.  Left breast reconstructed. 2. Mammogram 08/26/2022 on right breast: Benign breast density category C    Endometrial cancer: Status post hysterectomy Melanoma on the right arm: Resected Atrial fibrillation: On anticoagulation status post cardioversion    Anemia required iron infusions follows with gastroenterology at [facility]   genetic testing: Negative for any mutations. Return to clinic in 1 year for follow-up.

## 2023-07-11 ENCOUNTER — Other Ambulatory Visit: Payer: Self-pay | Admitting: Internal Medicine

## 2023-07-11 DIAGNOSIS — Z1231 Encounter for screening mammogram for malignant neoplasm of breast: Secondary | ICD-10-CM

## 2023-08-26 ENCOUNTER — Ambulatory Visit
Admission: RE | Admit: 2023-08-26 | Discharge: 2023-08-26 | Disposition: A | Discharge status: Home / Self Care | Payer: Medicare HMO | Source: Ambulatory Visit | Attending: Internal Medicine | Admitting: Internal Medicine

## 2023-08-26 DIAGNOSIS — Z1231 Encounter for screening mammogram for malignant neoplasm of breast: Secondary | ICD-10-CM

## 2024-04-13 ENCOUNTER — Inpatient Hospital Stay: Payer: Medicare HMO | Admitting: Hematology and Oncology

## 2024-04-13 NOTE — Assessment & Plan Note (Deleted)
 03/30/2010 status post left mastectomy with axillary lymph node dissection November 2011 and triple negative disease with Ki-67 13% followed by immediate reconstruction status post adjuvant chemotherapy with AC followed by Taxol carbo   Surveillance:   1. Breast exam 04/13/2024: No palpable lumps or nodules in the right breast.  Left breast reconstructed. 2. Mammogram 08/26/2023 on right breast: Benign breast density category C    Endometrial cancer: Status post hysterectomy Melanoma on the right arm: Resected Atrial fibrillation: On anticoagulation status post cardioversion    Anemia required iron infusions  at [facility]   genetic testing: Negative for any mutations. Return to clinic in 1 year for follow-up.

## 2024-04-28 ENCOUNTER — Inpatient Hospital Stay: Attending: Hematology and Oncology | Admitting: Hematology and Oncology

## 2024-04-28 VITALS — BP 130/55 | HR 77 | Temp 97.4°F | Resp 18 | Ht 66.0 in | Wt 203.2 lb

## 2024-04-28 DIAGNOSIS — C50212 Malignant neoplasm of upper-inner quadrant of left female breast: Secondary | ICD-10-CM

## 2024-04-28 DIAGNOSIS — Z17421 Hormone receptor negative with human epidermal growth factor receptor 2 negative status: Secondary | ICD-10-CM | POA: Insufficient documentation

## 2024-04-28 DIAGNOSIS — G8929 Other chronic pain: Secondary | ICD-10-CM | POA: Insufficient documentation

## 2024-04-28 DIAGNOSIS — M25561 Pain in right knee: Secondary | ICD-10-CM | POA: Insufficient documentation

## 2024-04-28 DIAGNOSIS — Z7901 Long term (current) use of anticoagulants: Secondary | ICD-10-CM | POA: Insufficient documentation

## 2024-04-28 DIAGNOSIS — D649 Anemia, unspecified: Secondary | ICD-10-CM | POA: Diagnosis not present

## 2024-04-28 DIAGNOSIS — Z171 Estrogen receptor negative status [ER-]: Secondary | ICD-10-CM

## 2024-04-28 DIAGNOSIS — C541 Malignant neoplasm of endometrium: Secondary | ICD-10-CM | POA: Insufficient documentation

## 2024-04-28 DIAGNOSIS — C4361 Malignant melanoma of right upper limb, including shoulder: Secondary | ICD-10-CM | POA: Diagnosis not present

## 2024-04-28 DIAGNOSIS — Z9221 Personal history of antineoplastic chemotherapy: Secondary | ICD-10-CM | POA: Diagnosis not present

## 2024-04-28 DIAGNOSIS — I4891 Unspecified atrial fibrillation: Secondary | ICD-10-CM | POA: Diagnosis not present

## 2024-04-28 DIAGNOSIS — Z79899 Other long term (current) drug therapy: Secondary | ICD-10-CM | POA: Diagnosis not present

## 2024-04-28 DIAGNOSIS — Z9012 Acquired absence of left breast and nipple: Secondary | ICD-10-CM | POA: Diagnosis not present

## 2024-04-28 DIAGNOSIS — Z90710 Acquired absence of both cervix and uterus: Secondary | ICD-10-CM | POA: Insufficient documentation

## 2024-04-28 NOTE — Assessment & Plan Note (Signed)
 Status post left mastectomy with axillary lymph node dissection November 2011 and triple negative disease with Ki-67 13% followed by immediate reconstruction status post adjuvant chemotherapy with AC followed by Taxol carbo   Surveillance:   1. Breast exam 04/28/24: No palpable lumps or nodules in the right breast.  Left breast reconstructed. 2. Mammogram 08/26/2023 on right breast: Benign breast density category C    Endometrial cancer: Status post hysterectomy Melanoma on the right arm: Resected Atrial fibrillation: On anticoagulation status post cardioversion    Anemia required iron infusions  at [facility]   genetic testing: Negative for any mutations. Return to clinic in 1 year for follow-up.

## 2024-04-28 NOTE — Progress Notes (Signed)
 Patient Care Team: Majorie Danelle LABOR, MD as PCP - General (Geriatric Medicine)  DIAGNOSIS:  Encounter Diagnosis  Name Primary?   Malignant neoplasm of upper-inner quadrant of left breast in female, estrogen receptor negative (HCC) Yes    SUMMARY OF ONCOLOGIC HISTORY: Oncology History  Breast cancer of upper-inner quadrant of left female breast (HCC)  03/30/2010 Surgery   Left Mastectomy: Multifocal LN Negative, ER/PR Neg, Her 2 Neg, Ki 67: 18%, Lef Breast reconstruction   04/25/2010 - 09/20/2010 Chemotherapy   AC X 4, Taxol carbo X 7, Taxol X 5   03/15/2019 Genetic Testing   Negative genetic testing on the multicancer gene panel.  The Multi-Gene Panel offered by [facility] includes sequencing and/or deletion duplication testing of the following 85 genes: AIP, ALK, APC, ATM, AXIN2,BAP1,  BARD1, BLM, BMPR1A, BRCA1, BRCA2, BRIP1, CASR, CDC73, CDH1, CDK4, CDKN1B, CDKN1C, CDKN2A (p14ARF), CDKN2A (p16INK4a), CEBPA, CHEK2, CTNNA1, DICER1, DIS3L2, EGFR (c.2369C>T, p.Thr790Met variant only), EPCAM (Deletion/duplication testing only), FH, FLCN, GATA2, GPC3, GREM1 (Promoter region deletion/duplication testing only), HOXB13 (c.251G>A, p.Gly84Glu), HRAS, KIT, MAX, MEN1, MET, MITF (c.952G>A, p.Glu318Lys variant only), MLH1, MSH2, MSH3, MSH6, MUTYH, NBN, NF1, NF2, NTHL1, PALB2, PDGFRA, PHOX2B, PMS2, POLD1, POLE, POT1, PRKAR1A, PTCH1, PTEN, RAD50, RAD51C, RAD51D, RB1, RECQL4, RET, RNF43, RUNX1, SDHAF2, SDHA (sequence changes only), SDHB, SDHC, SDHD, SMAD4, SMARCA4, SMARCB1, SMARCE1, STK11, SUFU, TERC, TERT, TMEM127, TP53, TSC1, TSC2, VHL, WRN and WT1.  The report date is March 15, 2019.     CHIEF COMPLIANT: Surveillance of breast cancer  HISTORY OF PRESENT ILLNESS:  History of Present Illness Rebekah Moreno is a 78 year old female with triple negative invasive ductal carcinoma of the left breast, endometrial cancer, and resected right arm melanoma, presenting for routine oncology follow-up and  breast cancer surveillance.  She has no breast pain or discomfort. Surveillance mammogram in April 2025 was normal, and she has had no new breast symptoms since her last visit.  Her endometrial cancer was treated with hysterectomy. A 0.5 cm lymph node was previously noted on CT, and she has no new symptoms suggestive of recurrence. Her right arm melanoma was resected, and she has no new skin or regional symptoms.  She has anemia previously requiring iron infusions and is now maintained on monthly injections given by her daughter. She is unsure of the medication name. Hemoglobin was stable at 12.4 g/dL in August 2025, and she has no new anemia-related symptoms while taking B3, B6, and B12 supplements.  She has chronic right knee pain with intermittent instability that requires a cane and limits her mobility. She last received an intra-articular injection about six months ago with partial relief.  Apr 14, 2023: Surveillance visit for history of triple negative left breast cancer, status post left mastectomy, axillary dissection, adjuvant AC and Taxol carbo chemotherapy, and reconstruction. No palpable lumps or nodules on breast exam; left breast reconstructed. Recent right breast mammogram (April 2024) showed benign findings. Also reviewed history of endometrial cancer (s/p hysterectomy, pending short-term CT follow-up for 0.5 cm LN), resected melanoma, and ongoing management of atrial fibrillation and anemia. Stable clinical status, to return in one year.     ALLERGIES:  is allergic to molds & smuts and succinylcholine.  MEDICATIONS:  Current Outpatient Medications  Medication Sig Dispense Refill   albuterol  (PROVENTIL  HFA;VENTOLIN  HFA) 108 (90 Base) MCG/ACT inhaler Inhale 2 puffs into the lungs every 4 (four) hours as needed for wheezing or shortness of breath. 1 Inhaler 5   ALPRAZolam  (XANAX ) 0.5 MG tablet Take  0.5 mg by mouth 2 (two) times daily as needed.     ALPRAZolam  (XANAX ) 1 MG tablet  Take 1/2 tablet by mouth three times daily as needed for anxiety 45 tablet 5   amiodarone (PACERONE) 200 MG tablet TAKE 1 TABLET BY MOUTH TWICE A DAY FOR 1 MONTH, THEN START ONCE DAILY     apixaban  (ELIQUIS ) 5 MG TABS tablet Take 1 tablet (5 mg total) by mouth 2 (two) times daily. 60 tablet    enalapril -hydrochlorothiazide  (VASERETIC ) 10-25 MG tablet TAKE 1 TABLET BY MOUTH EVERY DAY 90 tablet 3   ibuprofen  (ADVIL ,MOTRIN ) 200 MG tablet Take 200 mg by mouth every 6 (six) hours as needed.     metoprolol  succinate (TOPROL -XL) 25 MG 24 hr tablet Take 1 tablet (25 mg total) by mouth daily. 90 tablet 3   Multiple Vitamins-Minerals (THERA-M) TABS Take by mouth.     NEXIUM  40 MG capsule TAKE 1 CAPSULE BY MOUTH EVERY DAY 90 capsule 3   sertraline  (ZOLOFT ) 100 MG tablet Take 1 tablet (100 mg total) by mouth at bedtime. 90 tablet 3   simvastatin  (ZOCOR ) 40 MG tablet Take 1 tablet (40 mg total) by mouth daily at 6 PM. 90 tablet 3   SYRINGE-NEEDLE, DISP, 3 ML (B-D 3CC LUER-LOK SYR 25GX1/2) 25G X 1-1/2 3 ML MISC Take 1 tablet by mouth daily.     SYRINGE-NEEDLE, DISP, 3 ML (B-D 3CC LUER-LOK SYR 25GX1/2) 25G X 1-1/2 3 ML MISC TAKE 4 CAPSULES BY MOUTH 1 HOUR PRIOR TO DENTAL APPOINTMENT     No current facility-administered medications for this visit.    PHYSICAL EXAMINATION: ECOG PERFORMANCE STATUS: 1 - Symptomatic but completely ambulatory  Vitals:   04/28/24 1002  BP: (!) 130/55  Pulse: 77  Resp: 18  Temp: (!) 97.4 F (36.3 C)  SpO2: 97%   Filed Weights   04/28/24 1002  Weight: 203 lb 3.2 oz (92.2 kg)    LABORATORY DATA:  I have reviewed the data as listed    Latest Ref Rng & Units 05/05/2018   11:50 AM 04/29/2018    7:01 AM 02/27/2018   10:48 AM  CMP  Glucose 70 - 99 mg/dL 886  878  79   BUN 6 - 23 mg/dL 21  19  23    Creatinine 0.40 - 1.20 mg/dL 9.09  8.99  8.78   Sodium 135 - 145 mEq/L 143  140  143   Potassium 3.5 - 5.1 mEq/L 4.0  3.6  4.3   Chloride 96 - 112 mEq/L 103  102  104    CO2 19 - 32 mEq/L 31   31   Calcium 8.4 - 10.5 mg/dL 9.4   9.5   Total Protein 6.5 - 8.1 g/dL   6.9   Total Bilirubin 0.3 - 1.2 mg/dL   0.4   Alkaline Phos 38 - 126 U/L   91   AST 15 - 41 U/L   21   ALT 0 - 44 U/L   16     Lab Results  Component Value Date   WBC 6.6 02/27/2018   HGB 11.2 (L) 04/29/2018   HCT 33.0 (L) 04/29/2018   MCV 82.9 02/27/2018   PLT 186 02/27/2018   NEUTROABS 4.0 02/27/2018    ASSESSMENT & PLAN:  Breast cancer of upper-inner quadrant of left female breast [facility]) Status post left mastectomy with axillary lymph node dissection November 2011 and triple negative disease with Ki-67 13% followed by immediate reconstruction status post adjuvant  chemotherapy with AC followed by Taxol carbo   Surveillance:   1. Breast exam 04/28/24: No palpable lumps or nodules in the right breast.  Left breast reconstructed. 2. Mammogram 08/26/2023 on right breast: Benign breast density category C    Endometrial cancer: Status post hysterectomy Melanoma on the right arm: Resected Atrial fibrillation: On anticoagulation status post cardioversion    Anemia required iron infusions  at [facility]   genetic testing: Negative for any mutations. Return to clinic in 1 year for follow-up.   No orders of the defined types were placed in this encounter.  The patient has a good understanding of the overall plan. she agrees with it. she will call with any problems that may develop before the next visit here.  I personally spent a total of 30 minutes in the care of the patient today including preparing to see the patient, getting/reviewing separately obtained history, performing a medically appropriate exam/evaluation, counseling and educating, placing orders, referring and communicating with other health care professionals, documenting clinical information in the EHR, independently interpreting results, communicating results, and coordinating care.   Viinay K Brinna Divelbiss, MD 04/28/24

## 2025-04-28 ENCOUNTER — Inpatient Hospital Stay: Attending: Hematology and Oncology | Admitting: Hematology and Oncology
